# Patient Record
Sex: Male | Born: 1965 | Race: White | Hispanic: No | Marital: Married | Smoking: Former smoker
Health system: Southern US, Community
[De-identification: ages and names within clinical notes are randomized; demographics above are authoritative.]

## PROBLEM LIST (undated history)

## (undated) DIAGNOSIS — R058 Other specified cough: Secondary | ICD-10-CM

## (undated) DIAGNOSIS — I10 Essential (primary) hypertension: Secondary | ICD-10-CM

## (undated) DIAGNOSIS — M779 Enthesopathy, unspecified: Secondary | ICD-10-CM

## (undated) DIAGNOSIS — I219 Acute myocardial infarction, unspecified: Secondary | ICD-10-CM

## (undated) DIAGNOSIS — Z8719 Personal history of other diseases of the digestive system: Secondary | ICD-10-CM

## (undated) DIAGNOSIS — K4090 Unilateral inguinal hernia, without obstruction or gangrene, not specified as recurrent: Secondary | ICD-10-CM

## (undated) DIAGNOSIS — R05 Cough: Secondary | ICD-10-CM

## (undated) DIAGNOSIS — K219 Gastro-esophageal reflux disease without esophagitis: Secondary | ICD-10-CM

## (undated) DIAGNOSIS — E785 Hyperlipidemia, unspecified: Secondary | ICD-10-CM

## (undated) HISTORY — PX: CARDIAC SURGERY: SHX584

## (undated) HISTORY — PX: OTHER SURGICAL HISTORY: SHX169

## (undated) HISTORY — DX: Gastro-esophageal reflux disease without esophagitis: K21.9

## (undated) HISTORY — DX: Essential (primary) hypertension: I10

## (undated) HISTORY — PX: SHOULDER SURGERY: SHX246

## (undated) HISTORY — DX: Hyperlipidemia, unspecified: E78.5

## (undated) HISTORY — PX: HERNIA REPAIR: SHX51

---

## 1988-02-01 HISTORY — PX: OTHER SURGICAL HISTORY: SHX169

## 2003-03-04 HISTORY — PX: ESOPHAGOGASTRODUODENOSCOPY: SHX1529

## 2007-02-01 HISTORY — PX: SHOULDER SURGERY: SHX246

## 2007-11-01 HISTORY — PX: ESOPHAGOGASTRODUODENOSCOPY: SHX1529

## 2009-09-07 ENCOUNTER — Emergency Department (HOSPITAL_COMMUNITY): Admission: EM | Admit: 2009-09-07 | Discharge: 2009-09-07 | Payer: Self-pay | Admitting: Emergency Medicine

## 2010-04-16 LAB — POCT CARDIAC MARKERS
CKMB, poc: <1 ng/mL — ABNORMAL LOW (ref 1.0–8.0)
Myoglobin, poc: 65.5 ng/mL (ref 12–200)
Troponin i, poc: <0.05 ng/mL (ref 0.00–0.09)

## 2010-04-16 LAB — BASIC METABOLIC PANEL
BUN: 8 mg/dL (ref 6–23)
CO2: 26 mEq/L (ref 19–32)
Calcium: 9.9 mg/dL (ref 8.4–10.5)
Chloride: 107 mEq/L (ref 96–112)
GFR calc Af Amer: >60 mL/min (ref >60)
Potassium: 4.6 mEq/L (ref 3.5–5.1)
Sodium: 139 mEq/L (ref 135–145)

## 2010-04-16 LAB — DIFFERENTIAL
Basophils Absolute: 0 10*3/uL (ref 0.0–0.1)
Lymphocytes Relative: 28 % (ref 12–46)
Lymphs Abs: 2.7 10*3/uL (ref 0.7–4.0)
Monocytes Relative: 9 % (ref 3–12)

## 2010-04-16 LAB — CBC
HCT: 41.3 % (ref 39.0–52.0)
MCH: 31.8 pg (ref 26.0–34.0)
MCV: 90.9 fL (ref 78.0–100.0)
RBC: 4.55 MIL/uL (ref 4.22–5.81)

## 2011-07-20 IMAGING — CR DG CHEST 1V PORT
1 series · 1 of 1 positions shown · non-contrast
Comparison: Portable exam 4192 hours without priors for comparison.

CLINICAL DATA: Chest pain, shortness of breath, hypertension

PORTABLE CHEST - 1 VIEW

[view not recorded]
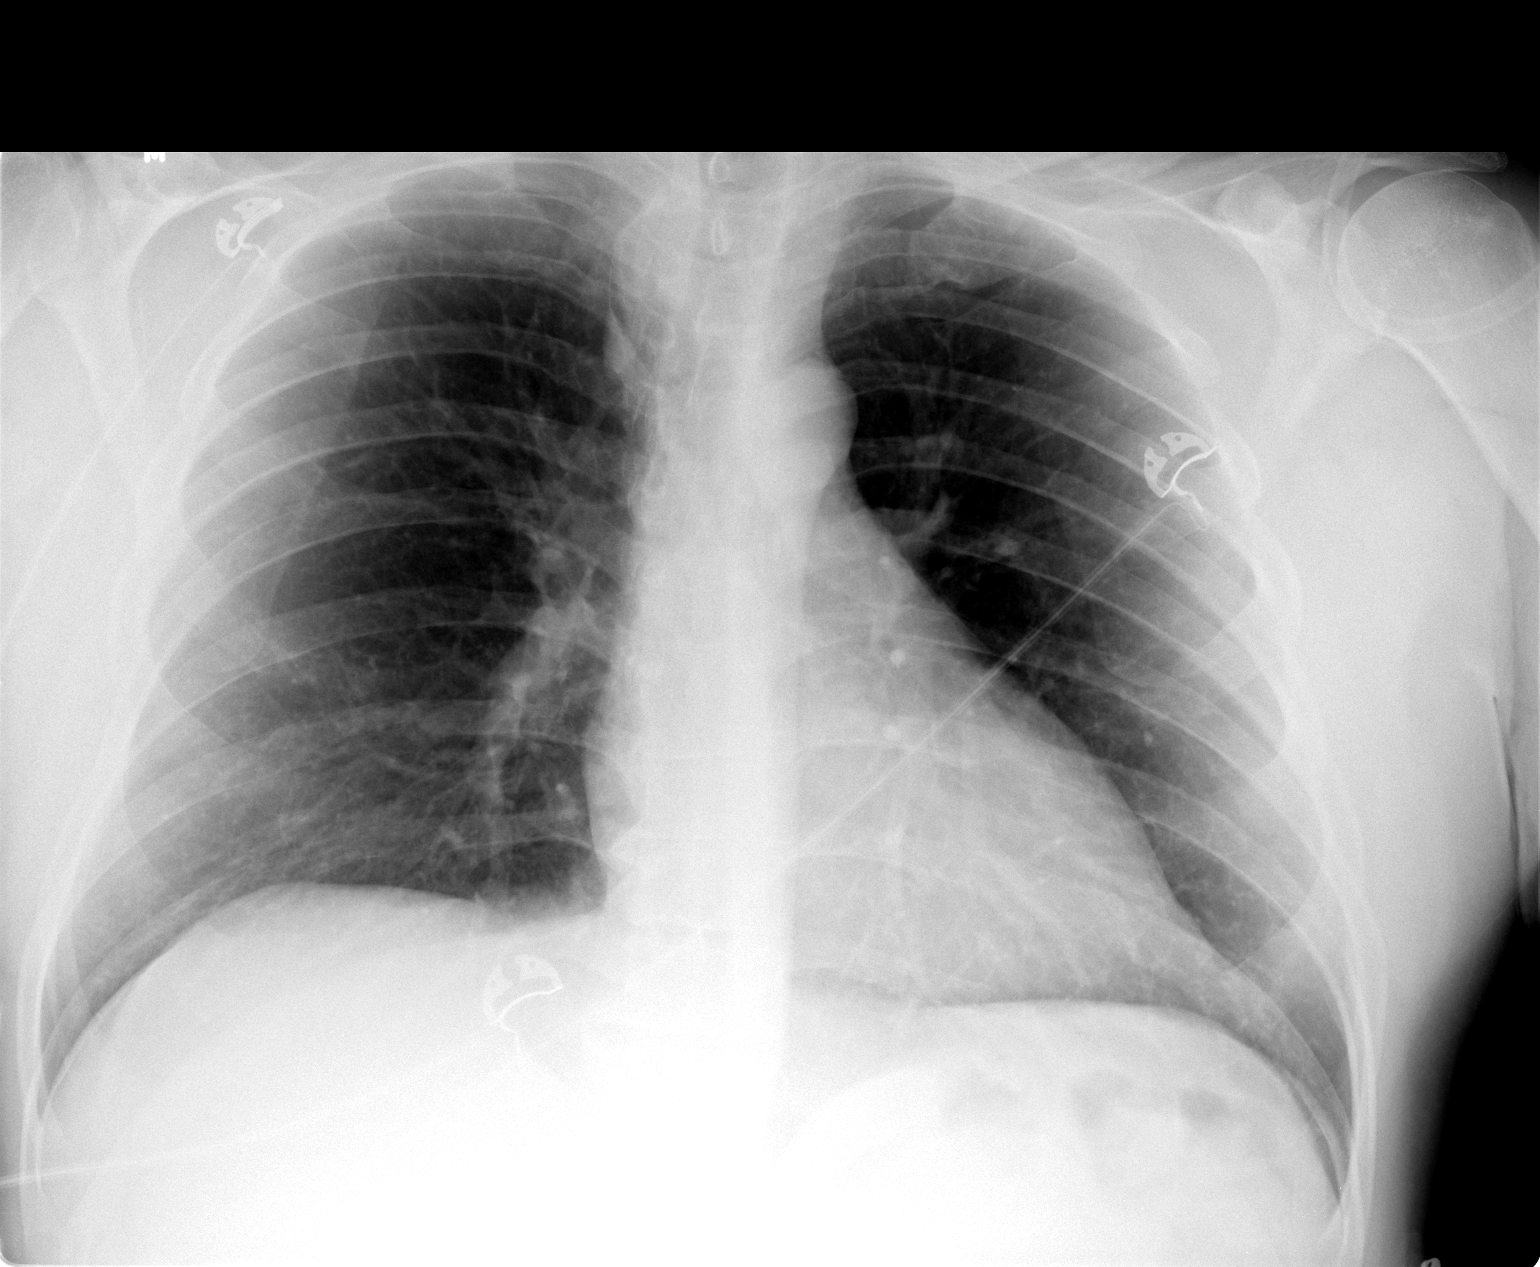

[1 of 1 positions shown; findings below may reference images not displayed]

FINDINGS: Normal heart size, mediastinal contours, and pulmonary vascularity.
Lungs clear.
No pleural effusion or pneumothorax.
No acute bony findings.
IMPRESSION: No acute abnormalities.

## 2013-01-14 ENCOUNTER — Encounter (INDEPENDENT_AMBULATORY_CARE_PROVIDER_SITE_OTHER): Payer: Self-pay | Admitting: Surgery

## 2013-01-28 ENCOUNTER — Encounter (INDEPENDENT_AMBULATORY_CARE_PROVIDER_SITE_OTHER): Payer: Self-pay

## 2013-01-28 ENCOUNTER — Encounter (INDEPENDENT_AMBULATORY_CARE_PROVIDER_SITE_OTHER): Payer: Self-pay | Admitting: Surgery

## 2013-01-28 ENCOUNTER — Ambulatory Visit (INDEPENDENT_AMBULATORY_CARE_PROVIDER_SITE_OTHER): Payer: BC Managed Care – PPO | Admitting: Surgery

## 2013-01-28 VITALS — BP 130/80 | HR 84 | Temp 98.9°F | Resp 14 | Ht 69.0 in | Wt 214.6 lb

## 2013-01-28 DIAGNOSIS — K4090 Unilateral inguinal hernia, without obstruction or gangrene, not specified as recurrent: Secondary | ICD-10-CM
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
## 2013-02-01 ENCOUNTER — Telehealth (INDEPENDENT_AMBULATORY_CARE_PROVIDER_SITE_OTHER): Payer: Self-pay

## 2013-02-01 NOTE — Telephone Encounter (Signed)
Pts wife calling to see if it will be ok for pt to get flu shot prior to surgery. I advised her that we would not restrict him from getting flu shot. She states she will discuss this with her husband to see if he wants to proceed with getting flu shot now.

## 2013-02-05 ENCOUNTER — Encounter (HOSPITAL_COMMUNITY): Payer: Self-pay | Admitting: Pharmacy Technician

## 2013-02-07 NOTE — Patient Instructions (Addendum)
Luis Hardin  02/07/2013                           YOUR PROCEDURE IS SCHEDULED ON:  02/12/13               PLEASE REPORT TO SHORT STAY CENTER AT :  7:30 am               CALL THIS NUMBER IF ANY PROBLEMS THE DAY OF SURGERY :               [phone]                      REMEMBER:   Do not eat food or drink liquids AFTER MIDNIGHT   Take these medicines the morning of surgery with A SIP OF WATER:  OMEORAZOLE   Do not wear jewelry, make-up   Do not wear lotions, powders, or perfumes.   Do not shave legs or underarms 12 hrs. before surgery (men may shave face)  Do not bring valuables to the hospital.  Contacts, dentures or bridgework may not be worn into surgery.  Leave suitcase in the car. After surgery it may be brought to your room.  For patients admitted to the hospital more than one night, checkout time is 11:00                          The day of discharge.   Patients discharged the day of surgery will not be allowed to drive home                             If going home same day of surgery, must have someone stay with you first                           24 hrs at home and arrange for some one to drive you home from hospital.    Special Instructions:   Please read over the following fact sheets that you were given:                 1. [place]                2. DISCONTINUE ASPIRIN AND HERBAL MED 5 DAYS PREOP                                                X_____________________________________________________________________        Failure to follow these instructions may result in cancellation of your surgery

## 2013-02-08 ENCOUNTER — Encounter (HOSPITAL_COMMUNITY)
Admission: RE | Admit: 2013-02-08 | Discharge: 2013-02-08 | Disposition: A | Discharge status: Home / Self Care | Payer: No Typology Code available for payment source | Source: Ambulatory Visit | Attending: Surgery | Admitting: Surgery

## 2013-02-08 ENCOUNTER — Encounter (HOSPITAL_COMMUNITY): Payer: Self-pay

## 2013-02-08 ENCOUNTER — Ambulatory Visit (HOSPITAL_COMMUNITY)
Admission: RE | Admit: 2013-02-08 | Discharge: 2013-02-08 | Disposition: A | Discharge status: Home / Self Care | Payer: No Typology Code available for payment source | Source: Ambulatory Visit | Attending: Surgery | Admitting: Surgery

## 2013-02-08 DIAGNOSIS — K4090 Unilateral inguinal hernia, without obstruction or gangrene, not specified as recurrent: Secondary | ICD-10-CM | POA: Insufficient documentation

## 2013-02-08 DIAGNOSIS — Z01818 Encounter for other preprocedural examination: Secondary | ICD-10-CM | POA: Insufficient documentation

## 2013-02-08 DIAGNOSIS — I10 Essential (primary) hypertension: Secondary | ICD-10-CM | POA: Insufficient documentation

## 2013-02-08 DIAGNOSIS — Z01812 Encounter for preprocedural laboratory examination: Secondary | ICD-10-CM | POA: Insufficient documentation

## 2013-02-08 DIAGNOSIS — E785 Hyperlipidemia, unspecified: Secondary | ICD-10-CM | POA: Insufficient documentation

## 2013-02-08 DIAGNOSIS — Z01810 Encounter for preprocedural cardiovascular examination: Secondary | ICD-10-CM | POA: Insufficient documentation

## 2013-02-08 DIAGNOSIS — R059 Cough, unspecified: Secondary | ICD-10-CM | POA: Insufficient documentation

## 2013-02-08 DIAGNOSIS — R05 Cough: Secondary | ICD-10-CM | POA: Insufficient documentation

## 2013-02-08 HISTORY — DX: Cough: R05

## 2013-02-08 HISTORY — DX: Enthesopathy, unspecified: M77.9

## 2013-02-08 HISTORY — DX: Unilateral inguinal hernia, without obstruction or gangrene, not specified as recurrent: K40.90

## 2013-02-08 HISTORY — DX: Other specified cough: R05.8

## 2013-02-08 HISTORY — DX: Personal history of other diseases of the digestive system: Z87.19

## 2013-02-08 LAB — CBC
HCT: 39.9 % (ref 39.0–52.0)
HEMOGLOBIN: 14.4 g/dL (ref 13.0–17.0)
MCH: 31.2 pg (ref 26.0–34.0)
MCHC: 36.1 g/dL — ABNORMAL HIGH (ref 30.0–36.0)
MCV: 86.4 fL (ref 78.0–100.0)
Platelets: 283 10*3/uL (ref 150–400)
RBC: 4.62 MIL/uL (ref 4.22–5.81)
RDW: 12.5 % (ref 11.5–15.5)
WBC: 7.8 10*3/uL (ref 4.0–10.5)

## 2013-02-08 LAB — BASIC METABOLIC PANEL
BUN: 8 mg/dL (ref 6–23)
CALCIUM: 9.4 mg/dL (ref 8.4–10.5)
CHLORIDE: 101 meq/L (ref 96–112)
CO2: 25 meq/L (ref 19–32)
CREATININE: 0.88 mg/dL (ref 0.50–1.35)
Glucose, Bld: 89 mg/dL (ref 70–99)
POTASSIUM: 4.2 meq/L (ref 3.7–5.3)
Sodium: 139 mEq/L (ref 137–147)
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
## 2013-02-12 ENCOUNTER — Ambulatory Visit (HOSPITAL_COMMUNITY): Payer: No Typology Code available for payment source | Admitting: Certified Registered Nurse Anesthetist

## 2013-02-12 ENCOUNTER — Encounter (HOSPITAL_COMMUNITY)
Admission: RE | Disposition: A | Discharge status: Home / Self Care | Payer: Self-pay | Source: Ambulatory Visit | Attending: Surgery

## 2013-02-12 ENCOUNTER — Ambulatory Visit (HOSPITAL_COMMUNITY)
Admission: RE | Admit: 2013-02-12 | Discharge: 2013-02-12 | Disposition: A | Discharge status: Home / Self Care | Payer: No Typology Code available for payment source | Source: Ambulatory Visit | Attending: Surgery | Admitting: Surgery

## 2013-02-12 ENCOUNTER — Encounter (HOSPITAL_COMMUNITY): Payer: No Typology Code available for payment source | Admitting: Certified Registered Nurse Anesthetist

## 2013-02-12 ENCOUNTER — Encounter (HOSPITAL_COMMUNITY): Payer: Self-pay | Admitting: *Deleted

## 2013-02-12 DIAGNOSIS — K4090 Unilateral inguinal hernia, without obstruction or gangrene, not specified as recurrent: Secondary | ICD-10-CM | POA: Insufficient documentation

## 2013-02-12 DIAGNOSIS — E785 Hyperlipidemia, unspecified: Secondary | ICD-10-CM | POA: Insufficient documentation

## 2013-02-12 DIAGNOSIS — Z79899 Other long term (current) drug therapy: Secondary | ICD-10-CM | POA: Insufficient documentation

## 2013-02-12 DIAGNOSIS — Z87891 Personal history of nicotine dependence: Secondary | ICD-10-CM | POA: Insufficient documentation

## 2013-02-12 DIAGNOSIS — I10 Essential (primary) hypertension: Secondary | ICD-10-CM | POA: Insufficient documentation

## 2013-02-12 DIAGNOSIS — K219 Gastro-esophageal reflux disease without esophagitis: Secondary | ICD-10-CM | POA: Insufficient documentation

## 2013-02-12 DIAGNOSIS — K449 Diaphragmatic hernia without obstruction or gangrene: Secondary | ICD-10-CM | POA: Insufficient documentation

## 2013-02-12 HISTORY — PX: INSERTION OF MESH: SHX5868

## 2013-02-12 HISTORY — PX: INGUINAL HERNIA REPAIR: SHX194

## 2013-02-12 SURGERY — REPAIR, HERNIA, INGUINAL, ADULT
Anesthesia: General | Site: Abdomen | Laterality: Right

## 2013-02-12 MED ORDER — OXYCODONE HCL 5 MG PO TABS
5.0000 mg | ORAL_TABLET | Freq: Once | ORAL | Status: AC | PRN
  Administered 2013-02-12: 5 mg via ORAL
  Filled 2013-02-12: qty 1

## 2013-02-12 MED ORDER — LIDOCAINE HCL (CARDIAC) 20 MG/ML IV SOLN
INTRAVENOUS | Status: AC
  Filled 2013-02-12: qty 5

## 2013-02-12 MED ORDER — MEPERIDINE HCL 50 MG/ML IJ SOLN: 6.2500 mg | INTRAMUSCULAR | Status: DC | PRN

## 2013-02-12 MED ORDER — FENTANYL CITRATE 0.05 MG/ML IJ SOLN
INTRAMUSCULAR | Status: AC
  Filled 2013-02-12: qty 2

## 2013-02-12 MED ORDER — LIDOCAINE HCL (CARDIAC) 20 MG/ML IV SOLN
INTRAVENOUS | Status: DC | PRN
  Administered 2013-02-12: 100 mg via INTRAVENOUS

## 2013-02-12 MED ORDER — HYDROMORPHONE HCL PF 1 MG/ML IJ SOLN
0.2500 mg | INTRAMUSCULAR | Status: DC | PRN
  Administered 2013-02-12 (×2): 0.5 mg via INTRAVENOUS

## 2013-02-12 MED ORDER — ONDANSETRON HCL 4 MG/2ML IJ SOLN
INTRAMUSCULAR | Status: DC | PRN
  Administered 2013-02-12: 4 mg via INTRAVENOUS

## 2013-02-12 MED ORDER — PROMETHAZINE HCL 25 MG/ML IJ SOLN
INTRAMUSCULAR | Status: AC
  Filled 2013-02-12: qty 1

## 2013-02-12 MED ORDER — HYDROMORPHONE HCL PF 1 MG/ML IJ SOLN
INTRAMUSCULAR | Status: AC
  Filled 2013-02-12: qty 1

## 2013-02-12 MED ORDER — BUPIVACAINE HCL (PF) 0.5 % IJ SOLN
INTRAMUSCULAR | Status: AC
  Filled 2013-02-12: qty 30

## 2013-02-12 MED ORDER — 0.9 % SODIUM CHLORIDE (POUR BTL) OPTIME
TOPICAL | Status: DC | PRN
  Administered 2013-02-12: 1000 mL

## 2013-02-12 MED ORDER — FENTANYL CITRATE 0.05 MG/ML IJ SOLN
INTRAMUSCULAR | Status: DC | PRN
  Administered 2013-02-12 (×6): 50 ug via INTRAVENOUS

## 2013-02-12 MED ORDER — KETOROLAC TROMETHAMINE 30 MG/ML IJ SOLN
INTRAMUSCULAR | Status: DC | PRN
  Administered 2013-02-12: 30 mg via INTRAVENOUS

## 2013-02-12 MED ORDER — PROPOFOL 10 MG/ML IV BOLUS
INTRAVENOUS | Status: DC | PRN
  Administered 2013-02-12: 200 mg via INTRAVENOUS
  Administered 2013-02-12: 130 mg via INTRAVENOUS

## 2013-02-12 MED ORDER — MIDAZOLAM HCL 2 MG/2ML IJ SOLN
INTRAMUSCULAR | Status: AC
  Filled 2013-02-12: qty 2

## 2013-02-12 MED ORDER — CEFAZOLIN SODIUM-DEXTROSE 2-3 GM-% IV SOLR
2.0000 g | INTRAVENOUS | Status: AC
  Administered 2013-02-12: 2 g via INTRAVENOUS

## 2013-02-12 MED ORDER — OXYCODONE HCL 5 MG/5ML PO SOLN
5.0000 mg | Freq: Once | ORAL | Status: AC | PRN
  Filled 2013-02-12: qty 5

## 2013-02-12 MED ORDER — PROMETHAZINE HCL 25 MG/ML IJ SOLN
6.2500 mg | INTRAMUSCULAR | Status: DC | PRN
  Administered 2013-02-12: 12.5 mg via INTRAVENOUS

## 2013-02-12 MED ORDER — HYDROCODONE-ACETAMINOPHEN 5-325 MG PO TABS: 1.0000 | ORAL_TABLET | ORAL | Status: DC | PRN

## 2013-02-12 MED ORDER — DEXAMETHASONE SODIUM PHOSPHATE 10 MG/ML IJ SOLN
INTRAMUSCULAR | Status: DC | PRN
  Administered 2013-02-12: 10 mg via INTRAVENOUS

## 2013-02-12 MED ORDER — CEFAZOLIN SODIUM-DEXTROSE 2-3 GM-% IV SOLR
INTRAVENOUS | Status: AC
  Filled 2013-02-12: qty 50

## 2013-02-12 MED ORDER — BUPIVACAINE HCL (PF) 0.5 % IJ SOLN
INTRAMUSCULAR | Status: DC | PRN
  Administered 2013-02-12: 20 mL

## 2013-02-12 MED ORDER — ONDANSETRON HCL 4 MG/2ML IJ SOLN
INTRAMUSCULAR | Status: AC
  Filled 2013-02-12: qty 2

## 2013-02-12 MED ORDER — MIDAZOLAM HCL 5 MG/5ML IJ SOLN
INTRAMUSCULAR | Status: DC | PRN
  Administered 2013-02-12: 2 mg via INTRAVENOUS

## 2013-02-12 MED ORDER — LACTATED RINGERS IV SOLN
INTRAVENOUS | Status: DC
  Administered 2013-02-12: 10:00:00 via INTRAVENOUS
  Administered 2013-02-12: 1000 mL via INTRAVENOUS

## 2013-02-12 MED ORDER — KETOROLAC TROMETHAMINE 30 MG/ML IJ SOLN
INTRAMUSCULAR | Status: AC
  Filled 2013-02-12: qty 1

## 2013-02-12 MED ORDER — PROPOFOL 10 MG/ML IV BOLUS
INTRAVENOUS | Status: AC
  Filled 2013-02-12: qty 20

## 2013-02-12 MED ORDER — FENTANYL CITRATE 0.05 MG/ML IJ SOLN
INTRAMUSCULAR | Status: AC
  Filled 2013-02-12: qty 5

## 2013-02-12 SURGICAL SUPPLY — 36 items
BLADE HEX COATED 2.75 (ELECTRODE) ×8 IMPLANT
BLADE SURG 15 STRL LF DISP TIS (BLADE) ×2 IMPLANT
BLADE SURG 15 STRL SS (BLADE) ×2
CANISTER SUCTION 2500CC (MISCELLANEOUS) ×4 IMPLANT
CHLORAPREP W/TINT 26ML (MISCELLANEOUS) ×4 IMPLANT
CLOSURE WOUND 1/2 X4 (GAUZE/BANDAGES/DRESSINGS) ×1
DECANTER SPIKE VIAL GLASS SM (MISCELLANEOUS) IMPLANT
DRAIN PENROSE 18X1/2 LTX STRL (DRAIN) ×4 IMPLANT
DRAPE LAPAROTOMY TRNSV 102X78 (DRAPE) ×4 IMPLANT
DRSG TEGADERM 4X4.75 (GAUZE/BANDAGES/DRESSINGS) ×4 IMPLANT
ELECT REM PT RETURN 9FT ADLT (ELECTROSURGICAL) ×4
ELECTRODE REM PT RTRN 9FT ADLT (ELECTROSURGICAL) ×2 IMPLANT
GLOVE SURG SIGNA 7.5 PF LTX (GLOVE) ×4 IMPLANT
GOWN STRL REUS W/TWL XL LVL3 (GOWN DISPOSABLE) ×8 IMPLANT
KIT BASIN OR (CUSTOM PROCEDURE TRAY) ×4 IMPLANT
MESH PARIETEX PROGRIP RIGHT (Mesh General) ×4 IMPLANT
NEEDLE HYPO 25X1 1.5 SAFETY (NEEDLE) ×4 IMPLANT
NS IRRIG 1000ML POUR BTL (IV SOLUTION) ×4 IMPLANT
PACK BASIC VI WITH GOWN DISP (CUSTOM PROCEDURE TRAY) ×4 IMPLANT
PENCIL BUTTON HOLSTER BLD 10FT (ELECTRODE) ×4 IMPLANT
SPONGE GAUZE 4X4 12PLY (GAUZE/BANDAGES/DRESSINGS) ×4 IMPLANT
SPONGE LAP 4X18 X RAY DECT (DISPOSABLE) ×8 IMPLANT
STRIP CLOSURE SKIN 1/2X4 (GAUZE/BANDAGES/DRESSINGS) ×3 IMPLANT
SUT MNCRL AB 4-0 PS2 18 (SUTURE) ×4 IMPLANT
SUT SILK 2 0 SH (SUTURE) ×4 IMPLANT
SUT VIC AB 2-0 CT1 27 (SUTURE) ×4
SUT VIC AB 2-0 CT1 TAPERPNT 27 (SUTURE) ×4 IMPLANT
SUT VIC AB 3-0 54XBRD REEL (SUTURE) IMPLANT
SUT VIC AB 3-0 BRD 54 (SUTURE)
SUT VIC AB 3-0 SH 27 (SUTURE) ×2
SUT VIC AB 3-0 SH 27XBRD (SUTURE) ×2 IMPLANT
SYR 20CC LL (SYRINGE) ×4 IMPLANT
SYR BULB IRRIGATION 50ML (SYRINGE) IMPLANT
TOWEL OR 17X26 10 PK STRL BLUE (TOWEL DISPOSABLE) ×4 IMPLANT
TOWEL OR NON WOVEN STRL DISP B (DISPOSABLE) ×4 IMPLANT
YANKAUER SUCT BULB TIP 10FT TU (MISCELLANEOUS) ×4 IMPLANT

## 2013-02-12 NOTE — Transfer of Care (Signed)
Immediate Anesthesia Transfer of Care Note  Patient: Luis Hardin  Procedure(s) Performed: Procedure(s) (LRB): RIGHT INGUINAL HERNIA REPAIR (Right) INSERTION OF MESH (N/A)  Patient Location: PACU  Anesthesia Type: General  Level of Consciousness: sedated, patient cooperative and responds to stimulation  Airway & Oxygen Therapy: Patient Spontanous Breathing and Patient connected to face mask oxgen  Post-op Assessment: Report given to PACU RN and Post -op Vital signs reviewed and stable  Post vital signs: Reviewed and stable  Complications: No apparent anesthesia complications

## 2013-02-12 NOTE — Op Note (Signed)
RIGHT INGUINAL HERNIA REPAIR, INSERTION OF MESH  Procedure Note  Meade Hogeland 02/12/2013   Pre-op Diagnosis: right inguinal hernia     Post-op Diagnosis: same  Procedure(s): RIGHT INGUINAL HERNIA REPAIR INSERTION OF MESH  Surgeon(s): Harl Bowie, MD  Anesthesia: General  Staff:  Circulator: Garret Reddish Dertouzos, RN Scrub Person: Ignacia Palma, RN Circulator Assistant: Morton Amy, RN  Estimated Blood Loss: Minimal                         Tharon Bomar A   Date: 02/12/2013  Time: 10:49 AM

## 2013-02-12 NOTE — Discharge Instructions (Signed)
[facility] _______[facility]  UMBILICAL OR INGUINAL HERNIA REPAIR: POST OP INSTRUCTIONS  Always review your discharge instruction sheet given to you by the facility where your surgery was performed. IF YOU HAVE DISABILITY OR FAMILY LEAVE FORMS, YOU MUST BRING THEM TO THE OFFICE FOR PROCESSING.   DO NOT GIVE THEM TO YOUR DOCTOR.  1. A  prescription for pain medication may be given to you upon discharge.  Take your pain medication as prescribed, if needed.  If narcotic pain medicine is not needed, then you may take acetaminophen (Tylenol) or ibuprofen (Advil) as needed. 2. Take your usually prescribed medications unless otherwise directed. 3. If you need a refill on your pain medication, please contact your pharmacy.  They will contact our office to request authorization. Prescriptions will not be filled after 5 pm or on week-ends. 4. You should follow a light diet the first 24 hours after arrival home, such as soup and crackers, etc.  Be sure to include lots of fluids daily.  Resume your normal diet the day after surgery. 5. Most patients will experience some swelling and bruising around the umbilicus or in the groin and scrotum.  Ice packs and reclining will help.  Swelling and bruising can take several days to resolve.  6. It is common to experience some constipation if taking pain medication after surgery.  Increasing fluid intake and taking a stool softener (such as Colace) will usually help or prevent this problem from occurring.  A mild laxative (Milk of Magnesia or Miralax) should be taken according to package directions if there are no bowel movements after 48 hours. 7. Unless discharge instructions indicate otherwise, you may remove your bandages 24-48 hours after surgery, and you may shower at that time.  You may have steri-strips (small skin tapes) in place directly over the incision.  These strips should be left on the skin for 7-10 days.  If your surgeon used skin glue on the  incision, you may shower in 24 hours.  The glue will flake off over the next 2-3 weeks.  Any sutures or staples will be removed at the office during your follow-up visit. 8. ACTIVITIES:  You may resume regular (light) daily activities beginning the next day--such as daily self-care, walking, climbing stairs--gradually increasing activities as tolerated.  You may have sexual intercourse when it is comfortable.  Refrain from any heavy lifting or straining until approved by your doctor. a. You may drive when you are no longer taking prescription pain medication, you can comfortably wear a seatbelt, and you can safely maneuver your car and apply brakes. b. RETURN TO WORK:  __________________________________________________________ 9. You should see your doctor in the office for a follow-up appointment approximately 2-3 weeks after your surgery.  Make sure that you call for this appointment within a day or two after you arrive home to insure a convenient appointment time. 10. OTHER INSTRUCTIONS: NO LIFTING MORE THAN 15 POUNDS FOR 4 WEEKS 11. ICE PACK ALSO FOR PAIN AS WELL AS IBUPROFEN 12. MAY REMOVE BANDAGE TOMORROW AND SHOWER 13. STOOL SOFTENER FOR CONSTIPATION __________________________________________________________________________________________________________________________________________________________________________________________  WHEN TO CALL YOUR DOCTOR: 1. Fever over 101.0 2. Inability to urinate 3. Nausea and/or vomiting 4. Extreme swelling or bruising 5. Continued bleeding from incision. 6. Increased pain, redness, or drainage from the incision  The clinic staff is available to answer your questions during regular business hours.  Please dont hesitate to call and ask to speak to one of the nurses for clinical concerns.  If  you have a medical emergency, go to the nearest emergency room or call 911.  A surgeon from [facility] is always on call at the hospital   [address], [place], [place], [place]  [address] ?  [address], [place], [place]   [address] [phone] ? [phone] ? FAX [phone] Web site: [email or web address]

## 2013-02-12 NOTE — Anesthesia Preprocedure Evaluation (Addendum)
Anesthesia Evaluation  Patient identified by MRN, date of birth, ID band Patient awake    Reviewed: Allergy & Precautions, H&P , NPO status , Patient's Chart, lab work & pertinent test results  Airway Mallampati: II TM Distance: >3 FB     Dental  (+) Dental Advisory Given   Pulmonary neg pulmonary ROS, former smoker,  breath sounds clear to auscultation        Cardiovascular hypertension, Pt. on medications Rhythm:Regular Rate:Normal     Neuro/Psych negative neurological ROS  negative psych ROS   GI/Hepatic Neg liver ROS, hiatal hernia, GERD-  Medicated,  Endo/Other  negative endocrine ROS  Renal/GU negative Renal ROS     Musculoskeletal negative musculoskeletal ROS (+)   Abdominal (+) + obese,   Peds  Hematology negative hematology ROS (+)   Anesthesia Other Findings   Reproductive/Obstetrics negative OB ROS                          Anesthesia Physical Anesthesia Plan  ASA: II  Anesthesia Plan: General   Post-op Pain Management:    Induction: Intravenous  Airway Management Planned: Oral ETT  Additional Equipment:   Intra-op Plan:   Post-operative Plan: Extubation in OR  Informed Consent: I have reviewed the patients History and Physical, chart, labs and discussed the procedure including the risks, benefits and alternatives for the proposed anesthesia with the patient or authorized representative who has indicated his/her understanding and acceptance.   Dental advisory given  Plan Discussed with: CRNA  Anesthesia Plan Comments:         Anesthesia Quick Evaluation

## 2013-02-12 NOTE — Anesthesia Postprocedure Evaluation (Signed)
Anesthesia Post Note  Patient: Luis Hardin  Procedure(s) Performed: Procedure(s) (LRB): RIGHT INGUINAL HERNIA REPAIR (Right) INSERTION OF MESH (N/A)  Anesthesia type: General  Patient location: PACU  Post pain: Pain level controlled  Post assessment: Post-op Vital signs reviewed  Last Vitals: BP 144/92  Pulse 88  Temp(Src) 36.2 C (Oral)  Resp 16  SpO2 97%  Post vital signs: Reviewed  Level of consciousness: sedated  Complications: No apparent anesthesia complications

## 2013-02-12 NOTE — Interval H&P Note (Signed)
History and Physical Interval Note: no change in H and P  02/12/2013 9:31 AM  Luis Hardin  has presented today for surgery, with the diagnosis of right inguinal hernia  The various methods of treatment have been discussed with the patient and family. After consideration of risks, benefits and other options for treatment, the patient has consented to  Procedure(s): RIGHT INGUINAL HERNIA REPAIR (Right) INSERTION OF MESH (N/A) as a surgical intervention .  The patient's history has been reviewed, patient examined, no change in status, stable for surgery.  I have reviewed the patient's chart and labs.  Questions were answered to the patient's satisfaction.     [name] A

## 2013-02-12 NOTE — Preoperative (Signed)
Beta Blockers   Reason not to administer Beta Blockers:Not Applicable 

## 2013-02-13 ENCOUNTER — Encounter (HOSPITAL_COMMUNITY): Payer: Self-pay | Admitting: Surgery

## 2013-02-13 NOTE — Op Note (Signed)
NAMEGORDAN, GRELL NO.:  [id number]  MEDICAL RECORD NO.:  [id number]  LOCATION:  XRAY                         FACILITY:  [facility]  PHYSICIAN:  Coralie Keens, M.D. DATE OF BIRTH:  November 22, 1965  DATE OF PROCEDURE:  02/12/2013 DATE OF DISCHARGE:  02/08/2013                              OPERATIVE REPORT   PREOPERATIVE DIAGNOSIS:  Right inguinal hernia.  POSTOPERATIVE DIAGNOSIS:  Right inguinal hernia.  PROCEDURE:  Right inguinal hernia repair with mesh.  SURGEON:  Coralie Keens, M.D.  ANESTHESIA:  General endotracheal anesthesia and 0.5% Marcaine.  ESTIMATED BLOOD LOSS:  Minimal.  FINDINGS:  The patient was found to have a large indirect hernia sac which was repaired with a piece of Parietex, Prolene, ProGrip mesh.  PROCEDURE IN DETAIL:  The patient was brought to the operating room and identified as Luis Hardin.  He was placed supine on the operating table and general anesthesia was induced.  His right upper quadrant, abdomen were prepped and draped in usual sterile fashion.  I then used 0.5% Marcaine and performed an ilioinguinal nerve block, as well as anesthetize the skin.  I then made a longitudinal incision with a scalpel.  I took this down to the Scarpa's fascia with electrocautery. The external oblique fascia was then identified and opened through the internal and external rings.  The patient's testicular cord structures were controlled with Penrose drain.  The patient had a very large indirect hernia sac which I was able to reduce from the cord structures. I then tied off  one small bleeding vein with a silk suture.  I then tied off the base of the sac after reducing contents back to the abdominal cavity with a silk suture.  I then excised  the redundant sac with a electrocautery.  Next a piece of Prolene, Parietex, ProGrip mesh was brought to the field.  I placed it as an onlay on the inguinal floor.  I then brought around the cord structures.   I then sewed it to the pubic tubercle, as well as the shelving edge of the inguinal ligament with interrupted 2-0 Vicryl sutures.  I removed the Penrose drain.  Good coverage of the inguinal floor appeared to be achieved.  I then closed the external oblique fascia over the top of this with running 2-0 Vicryl suture.  The fascia was anesthetized further with Marcaine.  I then closed the subcutaneous tissue with interrupted 3-0 Vicryl sutures and closed the skin with a running 4-0 Monocryl.  Steri-Strips,  gauze, and Tegaderm were then applied.  The patient tolerated the procedure well.  All the counts were correct at the end of procedure.  The patient was then extubated in the operating room and taken in stable condition to recovery room.     Coralie Keens, M.D.     DB/MEDQ  D:  02/12/2013  T:  02/12/2013  Job:  [id number]

## 2013-02-15 ENCOUNTER — Telehealth (INDEPENDENT_AMBULATORY_CARE_PROVIDER_SITE_OTHER): Payer: Self-pay | Admitting: General Surgery

## 2013-02-15 NOTE — Telephone Encounter (Signed)
Pt's wife called to discuss problem with constipation post operatively.  He is walking some now and has begun to pass gas.  Recommended he take MOM per the label instructions, continue taking stool softeners (up to 3 a day, as he is now doing,) drink as much fluid as possible and begin to scale back on narcotics.  OK to take ibuprofen or Aleve during the day or between doses of narcotics.  She understands and will relate same to pt.

## 2013-02-25 ENCOUNTER — Ambulatory Visit (INDEPENDENT_AMBULATORY_CARE_PROVIDER_SITE_OTHER): Payer: Medicare Other | Admitting: Surgery

## 2013-02-25 ENCOUNTER — Encounter (INDEPENDENT_AMBULATORY_CARE_PROVIDER_SITE_OTHER): Payer: Self-pay | Admitting: Surgery

## 2013-02-25 VITALS — BP 128/88 | HR 77 | Temp 97.4°F | Resp 16 | Ht 69.0 in | Wt 214.2 lb

## 2013-02-25 DIAGNOSIS — Z09 Encounter for follow-up examination after completed treatment for conditions other than malignant neoplasm: Secondary | ICD-10-CM

## 2013-02-25 NOTE — Progress Notes (Signed)
Subjective:     Patient ID: Luis Hardin, male   DOB: 27-Oct-1965, 48 y.o.   MRN: [id number]  HPI He is here for his first postop visit status post right inguinal hernia repair with mesh. He is doing well and has no complaints.  Review of Systems     Objective:   Physical Exam On exam, his incision is well-healed. There is mild postoperative swelling. There is no evidence of recurrence.    Assessment:     Patient stable postop     Plan:     He is recovering well. He may return to work on 03/18/2013 to full duty without restrictions. He may do no lifting until then. I will see him back as needed

## 2013-08-07 ENCOUNTER — Telehealth (INDEPENDENT_AMBULATORY_CARE_PROVIDER_SITE_OTHER): Payer: Self-pay | Admitting: General Surgery

## 2013-08-07 NOTE — Telephone Encounter (Signed)
Message copied by Maryclare Bean on Wed Aug 07, 2013 10:01 AM ------      Message from: [place], [place]: Wed Aug 07, 2013  9:32 AM      Contact: [phone]       Please call him he had surgery in jan and he is still having pain and has some questions please call ------

## 2013-08-07 NOTE — Telephone Encounter (Signed)
Patient has a block phone

## 2014-12-21 IMAGING — CR DG CHEST 2V
2 series · 2 of 2 positions shown · non-contrast
Comparison: 09/07/2009

CLINICAL DATA: Preoperative evaluation for hernia repair, cough for
few days, history hypertension, hyperlipidemia

EXAM:
CHEST  2 VIEW

[w chest pa]
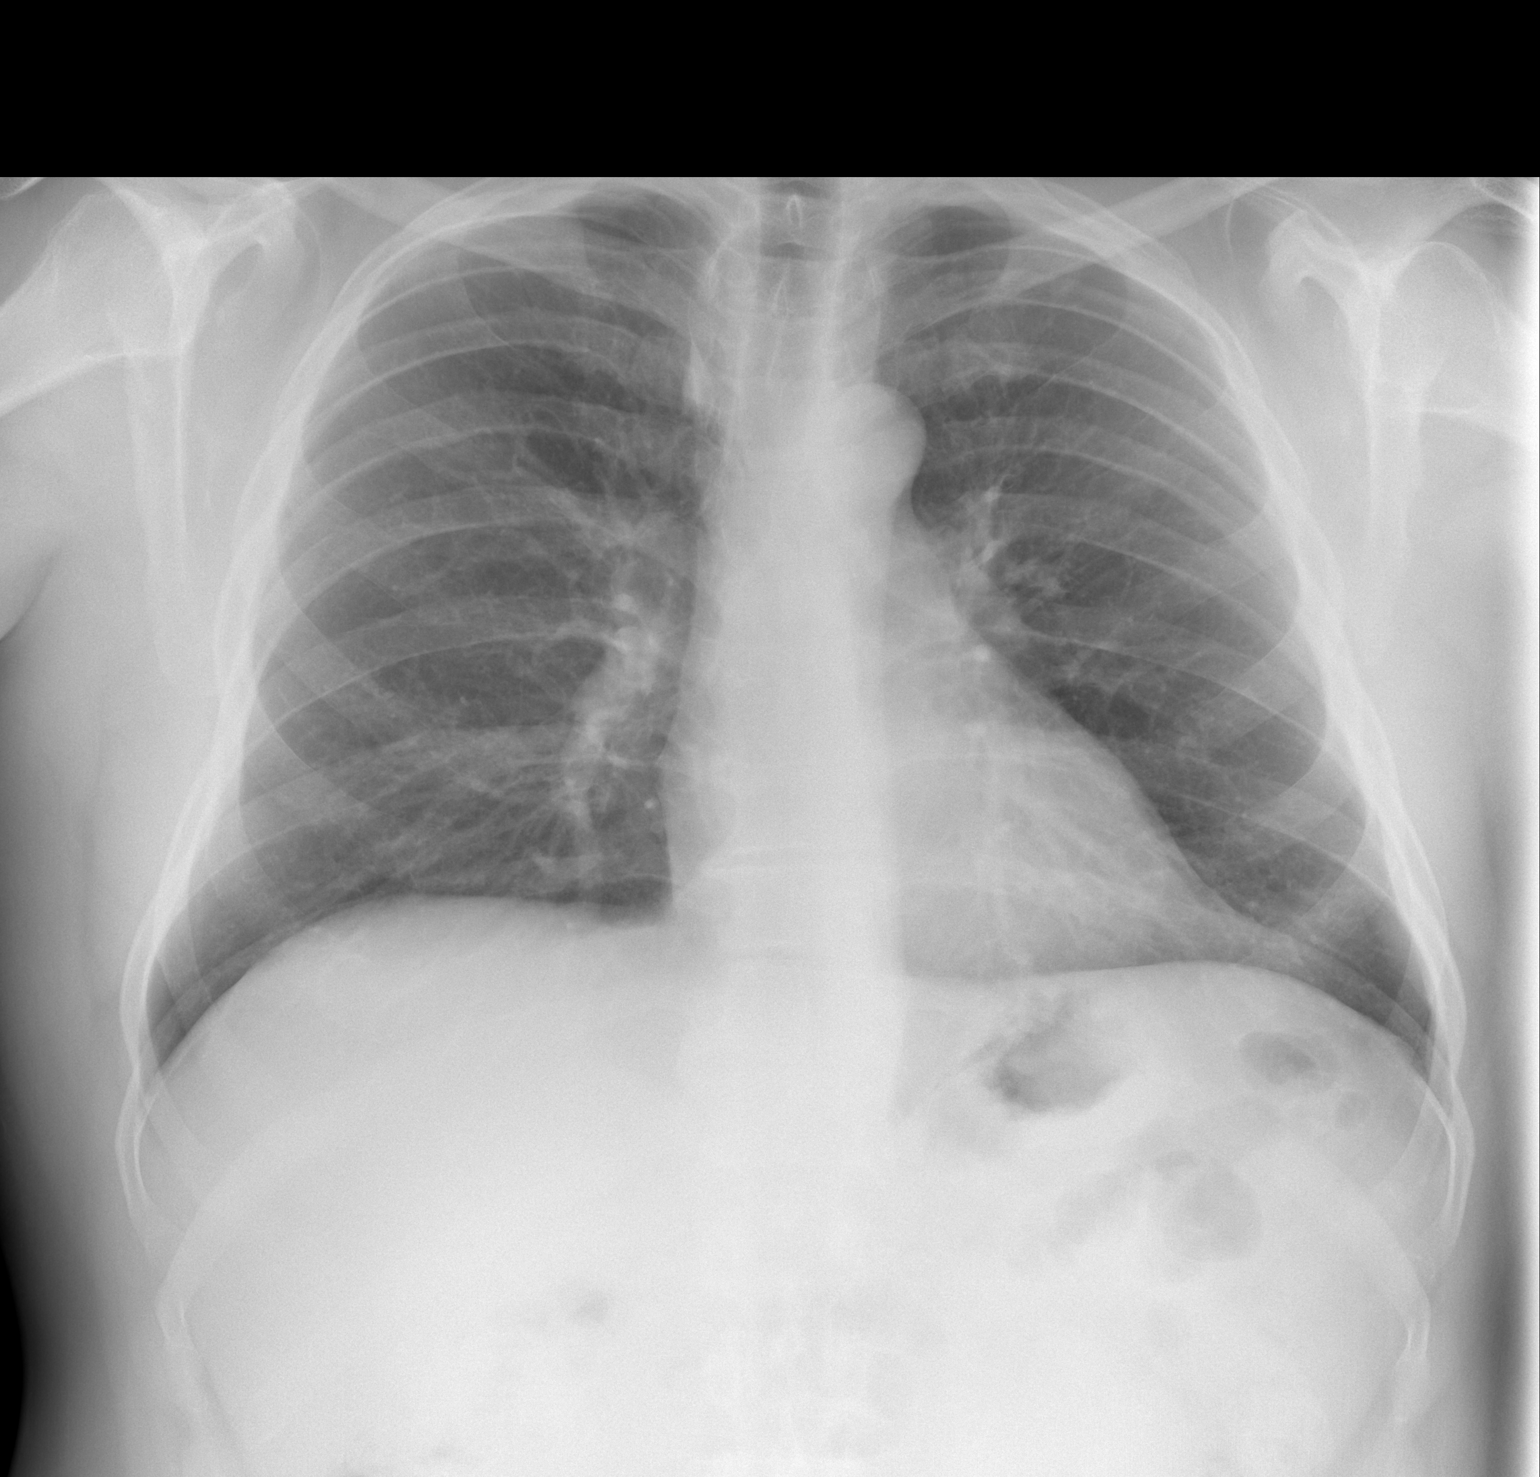

[w chest lat]
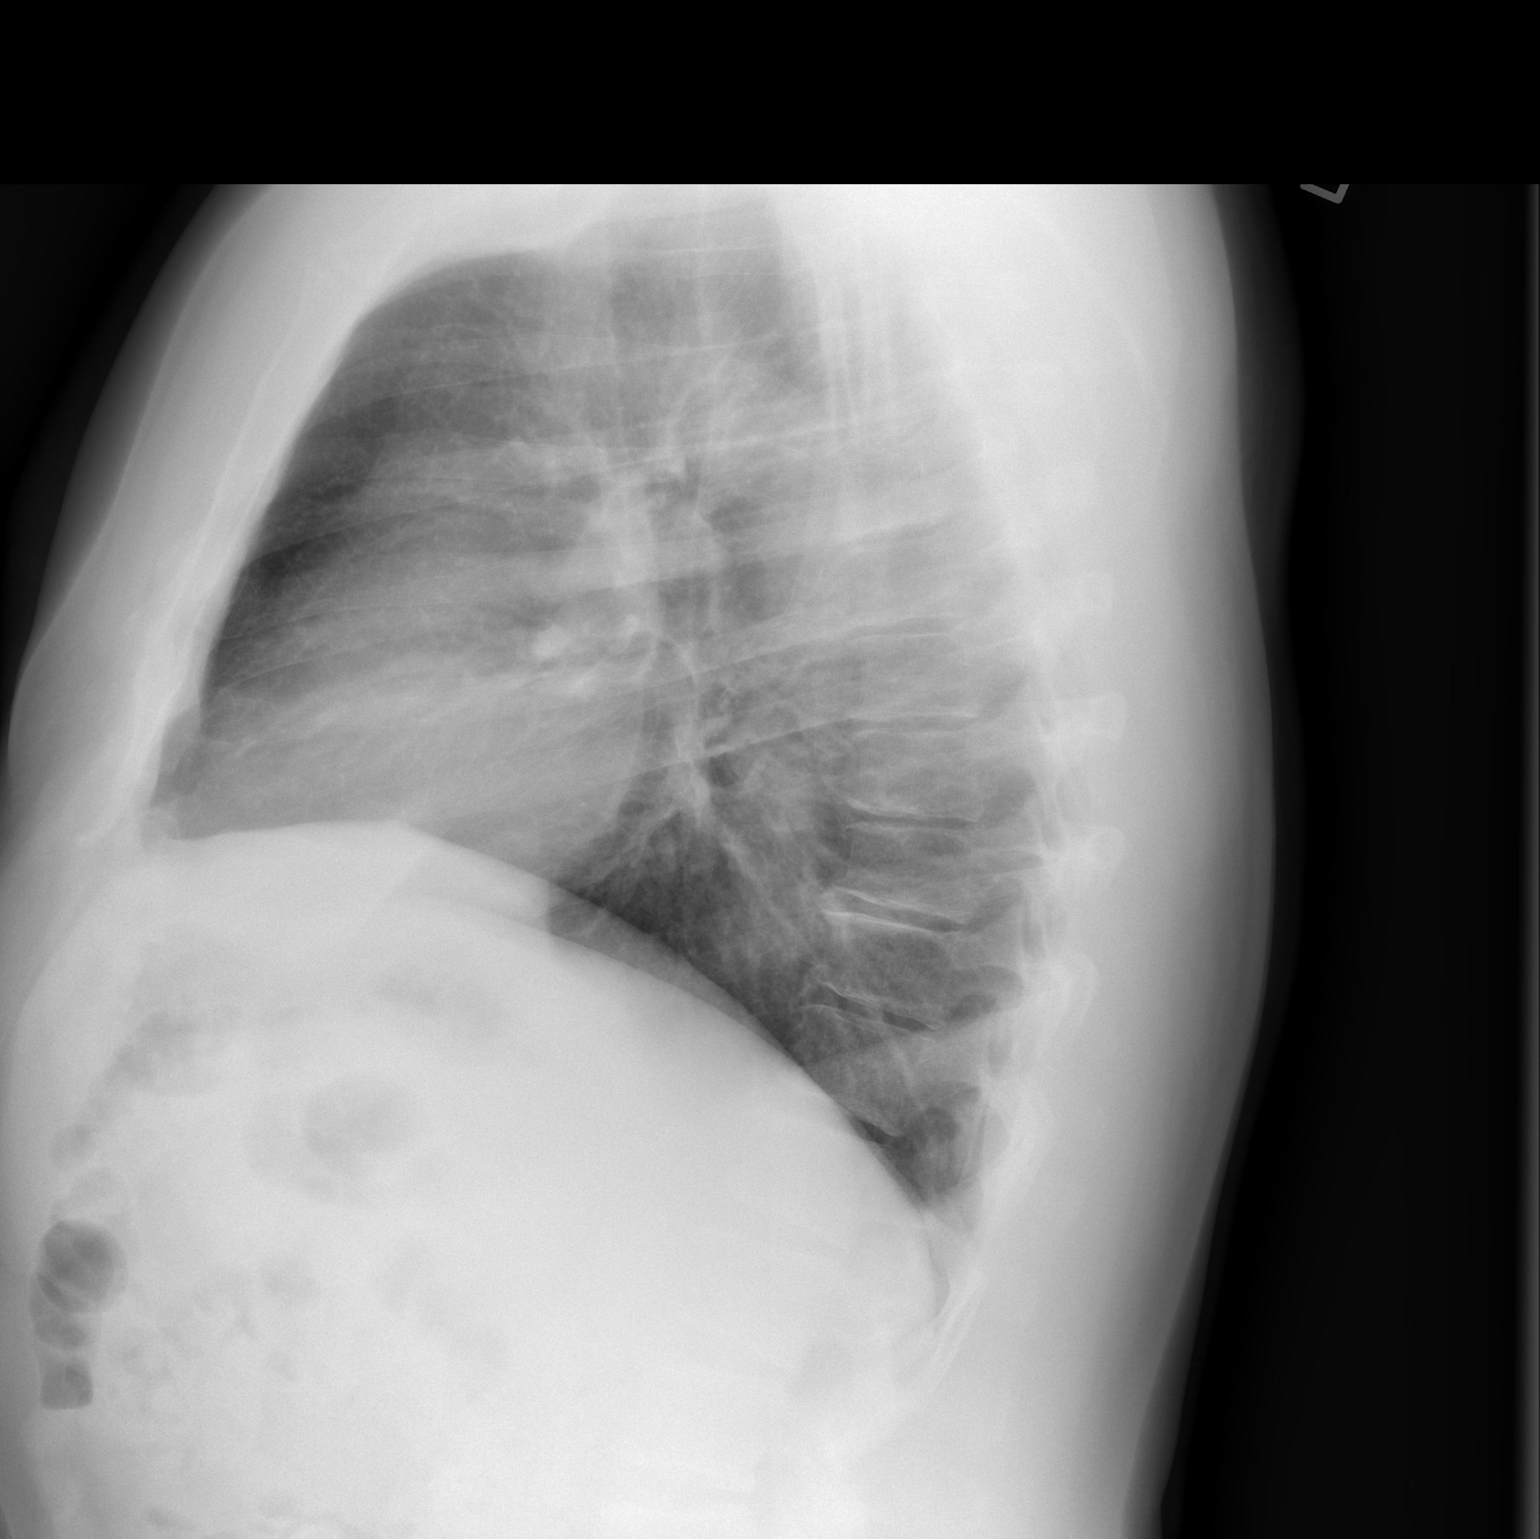

[2 of 2 positions shown; findings below may reference images not displayed]

FINDINGS: Normal heart size, mediastinal contours, and pulmonary vascularity.

Lungs clear.

Bones unremarkable.

No pneumothorax.

Azygos fissure incidentally noted, normal variant.
IMPRESSION: Normal exam.

## 2015-11-25 ENCOUNTER — Telehealth: Payer: Self-pay

## 2015-11-25 NOTE — Telephone Encounter (Signed)
PLEASE CALL PATIENT TO TRIAGE  [phone]

## 2015-12-01 NOTE — Telephone Encounter (Signed)
I called and spoke to pt's wife. She said that he has hemorrhoids and having some rectal bleeding. OV with Neil Crouch, PA on 12/17/2015 at 2:30 pm.

## 2015-12-17 ENCOUNTER — Encounter: Payer: Self-pay | Admitting: Gastroenterology

## 2015-12-17 ENCOUNTER — Ambulatory Visit (INDEPENDENT_AMBULATORY_CARE_PROVIDER_SITE_OTHER): Payer: 59 | Admitting: Gastroenterology

## 2015-12-17 ENCOUNTER — Other Ambulatory Visit: Payer: Self-pay

## 2015-12-17 DIAGNOSIS — K649 Unspecified hemorrhoids: Secondary | ICD-10-CM

## 2015-12-17 DIAGNOSIS — K625 Hemorrhage of anus and rectum: Secondary | ICD-10-CM | POA: Diagnosis not present

## 2015-12-17 DIAGNOSIS — K219 Gastro-esophageal reflux disease without esophagitis: Secondary | ICD-10-CM

## 2015-12-17 MED ORDER — PEG 3350-KCL-NA BICARB-NACL 420 G PO SOLR: 4000.0000 mL | ORAL | 0 refills | Status: DC

## 2015-12-17 NOTE — Patient Instructions (Signed)
1. Colonoscopy with possible hemorrhoid banding with Dr. Oneida Alar. See separate instructions.

## 2015-12-17 NOTE — Assessment & Plan Note (Signed)
50 year old gentleman with history of intermittent rectal bleeding likely due to hemorrhoids. No prior colonoscopy. Recommend colonoscopy in the near future with possible hemorrhoid banding. Patient has failed topical regimens he continues to have itching and fecal soilage which is bothersome on a regular basis. Patient like to have hemorrhoid banding if deemed a candidate at time of his colonoscopy.  I have discussed the risks, alternatives, benefits with regards to but not limited to the risk of reaction to medication, bleeding, infection, perforation and the patient is agreeable to proceed. Written consent to be obtained.

## 2015-12-17 NOTE — Assessment & Plan Note (Signed)
Patient gives history of chronic GERD, ulcerative esophagitis in the remote past. Questionable Barrett's but states subsequent endoscopy was negative for this. Symptoms well controlled on PPI therapy. When he stopped medication recently had significant heartburn recurrence. Continue PPI therapy. We will request a copy of endoscopy records for further review.

## 2015-12-17 NOTE — Patient Instructions (Signed)
PA# for TCS: [id number]

## 2015-12-17 NOTE — Progress Notes (Signed)
EGD 11/06/2007 by Dr. Bernarda Caffey. Several tongues of columnar epithelium coming up from Z line suspicious for short segment Barrett's, biopsies not consistent with Barrett's. Biopsy showed eosinophilic esophagitis, acute and chronic inflammation of distal esophagus and gastric cardia.  EGD 03/20/2003 by Dr. Marden Noble Shiflet question short segment Barrett's, biopsy with chronic carditis. According to that operative note, patient had a history of erosive esophagitis in the past.

## 2015-12-17 NOTE — Progress Notes (Addendum)
REVIEWED-NO ADDITIONAL RECOMMENDATIONS.  Primary Care Physician:  Shade Flood, MD  Primary Gastroenterologist:  Barney Drain, MD   Chief Complaint  Patient presents with  . Hemorrhoids    HPI:  Luis Hardin is a 50 y.o. male here At the request of his PCP for colonoscopy. He's never had a colonoscopy. He does have issues with hemorrhoids and intermittent rectal bleeding. His wife is a patient of Dr. Oneida Alar. Patient reports regular bowel movements. Anal itching is a problem lately. Years ago, used Prep-H which would help. Lately almost constantly having fecal soilage and anal itching without relief with the use of hydrocortisone creams. Small volume brbpr if wiping a lot. Denies abdominal pain. Heartburn is well controlled as long as he stays on this medication. No dysphagia. On omeprazole 20 years. Dr. San Jetty did EGD, ulcerative esophagitis. Tested for Barrett's. Patient reports it turned out negative. Has had 2 endoscopies with the last one being clean. PCP recently encouraged him to come off of PPI therapy, after missing one dose he had recurrent heartburn.  Current Outpatient Prescriptions  Medication Sig Dispense Refill  . beta carotene w/minerals (OCUVITE) tablet Take 1 tablet by mouth daily.    . diclofenac (VOLTAREN) 75 MG EC tablet     . lisinopril (PRINIVIL,ZESTRIL) 40 MG tablet Take 40 mg by mouth daily with breakfast.     . omega-3 acid ethyl esters (LOVAZA) 1 G capsule Take by mouth 2 (two) times daily.    Marland Kitchen omeprazole (PRILOSEC) 20 MG capsule Take 20 mg by mouth daily.     No current facility-administered medications for this visit.     Allergies as of 12/17/2015  . (No Known Allergies)    Past Medical History:  Diagnosis Date  . Dry cough   . GERD (gastroesophageal reflux disease)   . H/O hiatal hernia   . Hyperlipidemia   . Hypertension   . Inguinal hernia    RIGHT  . Tendonitis    KNEES    Past Surgical History:  Procedure Laterality Date  . HERNIA  REPAIR    . INGUINAL HERNIA REPAIR Right 02/12/2013   Procedure: RIGHT INGUINAL HERNIA REPAIR;  Surgeon: Harl Bowie, MD;  Location: [facility];  Service: General;  Laterality: Right;  . INSERTION OF MESH N/A 02/12/2013   Procedure: INSERTION OF MESH;  Surgeon: Harl Bowie, MD;  Location: [facility];  Service: General;  Laterality: N/A;  . SHOULDER SURGERY     L SHOULDER  . TUMOR REMOVED     LEFT SIDE OF NECK (BENIGN)    Family History  Problem Relation Age of Onset  . Cancer Father     lung  . Cancer Brother     skin  . Cancer Paternal Grandfather     mouth  . Colon cancer Neg Hx     Social History   Social History  . Marital status: Married    Spouse name: N/A  . Number of children: N/A  . Years of education: N/A   Occupational History  . textiles    Social History Main Topics  . Smoking status: Former Smoker    Quit date: 02/01/1988  . Smokeless tobacco: Never Used  . Alcohol use No  . Drug use: No  . Sexual activity: Not on file   Other Topics Concern  . Not on file   Social History Narrative  . No narrative on file      ROS:  General: Negative for anorexia, weight loss, fever,  chills, fatigue, weakness. Eyes: Negative for vision changes.  ENT: Negative for hoarseness, difficulty swallowing , nasal congestion. CV: Negative for chest pain, angina, palpitations, dyspnea on exertion, peripheral edema.  Respiratory: Negative for dyspnea at rest, dyspnea on exertion, cough, sputum, wheezing.  GI: See history of present illness. GU:  Negative for dysuria, hematuria, urinary incontinence, urinary frequency, nocturnal urination.  MS: Negative for joint pain, low back pain.  Derm: Negative for rash or itching.  Neuro: Negative for weakness, abnormal sensation, seizure, frequent headaches, memory loss, confusion.  Psych: Negative for anxiety, depression, suicidal ideation, hallucinations.  Endo: Negative for unusual weight change.  Heme: Negative for  bruising or bleeding. Allergy: Negative for rash or hives.    Physical Examination:  BP 132/84   Pulse 84   Temp 98.3 F (36.8 C) (Oral)   Ht 5\' 9"  (1.753 m)   Wt 213 lb 12.8 oz (97 kg)   BMI 31.57 kg/m    General: Well-nourished, well-developed in no acute distress.  Head: Normocephalic, atraumatic.   Eyes: Conjunctiva pink, no icterus. Mouth: Oropharyngeal mucosa moist and pink , no lesions erythema or exudate. Neck: Supple without thyromegaly, masses, or lymphadenopathy.  Lungs: Clear to auscultation bilaterally.  Heart: Regular rate and rhythm, no murmurs rubs or gallops.  Abdomen: Bowel sounds are normal, nontender, nondistended, no hepatosplenomegaly or masses, no abdominal bruits or    hernia , no rebound or guarding.   Rectal: deferred. Extremities: No lower extremity edema. No clubbing or deformities.  Neuro: Alert and oriented x 4 , grossly normal neurologically.  Skin: Warm and dry, no rash or jaundice.   Psych: Alert and cooperative, normal mood and affect.

## 2015-12-18 NOTE — Progress Notes (Signed)
cc'ed to pcp °

## 2016-01-04 ENCOUNTER — Ambulatory Visit (HOSPITAL_COMMUNITY)
Admission: RE | Admit: 2016-01-04 | Discharge: 2016-01-04 | Disposition: A | Discharge status: Home / Self Care | Payer: 59 | Source: Ambulatory Visit | Attending: Gastroenterology | Admitting: Gastroenterology

## 2016-01-04 ENCOUNTER — Encounter (HOSPITAL_COMMUNITY)
Admission: RE | Disposition: A | Discharge status: Home / Self Care | Payer: Self-pay | Source: Ambulatory Visit | Attending: Gastroenterology

## 2016-01-04 ENCOUNTER — Encounter (HOSPITAL_COMMUNITY): Payer: Self-pay | Admitting: *Deleted

## 2016-01-04 DIAGNOSIS — Z1211 Encounter for screening for malignant neoplasm of colon: Secondary | ICD-10-CM

## 2016-01-04 DIAGNOSIS — Q438 Other specified congenital malformations of intestine: Secondary | ICD-10-CM | POA: Diagnosis not present

## 2016-01-04 DIAGNOSIS — Z801 Family history of malignant neoplasm of trachea, bronchus and lung: Secondary | ICD-10-CM | POA: Diagnosis not present

## 2016-01-04 DIAGNOSIS — K5730 Diverticulosis of large intestine without perforation or abscess without bleeding: Secondary | ICD-10-CM | POA: Diagnosis not present

## 2016-01-04 DIAGNOSIS — K219 Gastro-esophageal reflux disease without esophagitis: Secondary | ICD-10-CM | POA: Insufficient documentation

## 2016-01-04 DIAGNOSIS — Z800 Family history of malignant neoplasm of digestive organs: Secondary | ICD-10-CM | POA: Diagnosis not present

## 2016-01-04 DIAGNOSIS — I10 Essential (primary) hypertension: Secondary | ICD-10-CM | POA: Insufficient documentation

## 2016-01-04 DIAGNOSIS — E785 Hyperlipidemia, unspecified: Secondary | ICD-10-CM | POA: Insufficient documentation

## 2016-01-04 DIAGNOSIS — Z79899 Other long term (current) drug therapy: Secondary | ICD-10-CM | POA: Insufficient documentation

## 2016-01-04 DIAGNOSIS — K625 Hemorrhage of anus and rectum: Secondary | ICD-10-CM

## 2016-01-04 DIAGNOSIS — K648 Other hemorrhoids: Secondary | ICD-10-CM | POA: Diagnosis not present

## 2016-01-04 DIAGNOSIS — Z808 Family history of malignant neoplasm of other organs or systems: Secondary | ICD-10-CM | POA: Insufficient documentation

## 2016-01-04 DIAGNOSIS — Z87891 Personal history of nicotine dependence: Secondary | ICD-10-CM | POA: Diagnosis not present

## 2016-01-04 DIAGNOSIS — Z1212 Encounter for screening for malignant neoplasm of rectum: Secondary | ICD-10-CM | POA: Diagnosis not present

## 2016-01-04 DIAGNOSIS — R05 Cough: Secondary | ICD-10-CM | POA: Diagnosis not present

## 2016-01-04 DIAGNOSIS — K449 Diaphragmatic hernia without obstruction or gangrene: Secondary | ICD-10-CM | POA: Insufficient documentation

## 2016-01-04 DIAGNOSIS — K644 Residual hemorrhoidal skin tags: Secondary | ICD-10-CM | POA: Diagnosis not present

## 2016-01-04 DIAGNOSIS — K649 Unspecified hemorrhoids: Secondary | ICD-10-CM

## 2016-01-04 HISTORY — PX: COLONOSCOPY: SHX5424

## 2016-01-04 SURGERY — COLONOSCOPY
Anesthesia: Moderate Sedation

## 2016-01-04 MED ORDER — MEPERIDINE HCL 100 MG/ML IJ SOLN
INTRAMUSCULAR | Status: DC | PRN
  Administered 2016-01-04 (×4): 25 mg via INTRAVENOUS

## 2016-01-04 MED ORDER — MIDAZOLAM HCL 5 MG/5ML IJ SOLN
INTRAMUSCULAR | Status: AC
  Filled 2016-01-04: qty 10

## 2016-01-04 MED ORDER — SODIUM CHLORIDE 0.9 % IV SOLN
INTRAVENOUS | Status: DC
  Administered 2016-01-04: 1000 mL via INTRAVENOUS

## 2016-01-04 MED ORDER — MIDAZOLAM HCL 5 MG/5ML IJ SOLN
INTRAMUSCULAR | Status: DC | PRN
  Administered 2016-01-04: 1 mg via INTRAVENOUS
  Administered 2016-01-04: 2 mg via INTRAVENOUS
  Administered 2016-01-04: 1 mg via INTRAVENOUS
  Administered 2016-01-04 (×2): 2 mg via INTRAVENOUS

## 2016-01-04 MED ORDER — MEPERIDINE HCL 100 MG/ML IJ SOLN
INTRAMUSCULAR | Status: AC
  Filled 2016-01-04: qty 2

## 2016-01-04 NOTE — Discharge Instructions (Signed)
YOU DID NOT HAVE ANY POLYPS. You have internal hemorrhoids, which MAY CAUSE FOR YOUR RECTAL BLEEDING.  YOU HAVE diverticulosis IN YOUR RIGHT COLON.   CONTINUE YOUR WEIGHT LOSS EFFORTS. Lose 20 lbs.  WHILE I DO NOT WANT TO ALARM YOU, YOUR BODY MASS INDEX IS OVER 30 WHICH MEANS YOU ARE OBESE. OBESITY DRIVES CANCER GENES AND IS ASSOCIATED WITH AN INCREASE RISK FOR ALL CANCERS, INCLUDING ESOPHAGEAL AND COLON CANCER.  Follow a HIGH FIBER/LOW FAT DIET. AVOID ITEMS THAT CAUSE BLOATING. See info below.  USE PREPARATION H FOUR TIMES  A DAY IF NEEDED TO RELIEVE RECTAL PAIN/PRESSURE/BLEEDING.  Next colonoscopy in 5 years. NO CHICKEN BROTH WITH OIL DROPLETS ON DAY PRIOR TO COLONOSCOPY.  Colonoscopy Care After Read the instructions outlined below and refer to this sheet in the next week. These discharge instructions provide you with general information on caring for yourself after you leave the hospital. While your treatment has been planned according to the most current medical practices available, unavoidable complications occasionally occur. If you have any problems or questions after discharge, call DR. Tehya Leath, [phone].  ACTIVITY  You may resume your regular activity, but move at a slower pace for the next 24 hours.   Take frequent rest periods for the next 24 hours.   Walking will help get rid of the air and reduce the bloated feeling in your belly (abdomen).   No driving for 24 hours (because of the medicine (anesthesia) used during the test).   You may shower.   Do not sign any important legal documents or operate any machinery for 24 hours (because of the anesthesia used during the test).    NUTRITION  Drink plenty of fluids.   You may resume your normal diet as instructed by your doctor.   Begin with a light meal and progress to your normal diet. Heavy or fried foods are harder to digest and may make you feel sick to your stomach (nauseated).   Avoid alcoholic beverages for 24  hours or as instructed.    MEDICATIONS  You may resume your normal medications.   WHAT YOU CAN EXPECT TODAY  Some feelings of bloating in the abdomen.   Passage of more gas than usual.   Spotting of blood in your stool or on the toilet paper  .  IF YOU HAD POLYPS REMOVED DURING THE COLONOSCOPY:  Eat a soft diet IF YOU HAVE NAUSEA, BLOATING, ABDOMINAL PAIN, OR VOMITING.    FINDING OUT THE RESULTS OF YOUR TEST Not all test results are available during your visit. DR. Oneida Alar WILL CALL YOU WITHIN 7 DAYS OF YOUR PROCEDUE WITH YOUR RESULTS. Do not assume everything is normal if you have not heard from DR. Amparo Donalson IN ONE WEEK, CALL HER OFFICE AT [phone].  SEEK IMMEDIATE MEDICAL ATTENTION AND CALL THE OFFICE: [phone] IF:  You have more than a spotting of blood in your stool.   Your belly is swollen (abdominal distention).   You are nauseated or vomiting.   You have a temperature over 101F.   You have abdominal pain or discomfort that is severe or gets worse throughout the day.  High-Fiber Diet A high-fiber diet changes your normal diet to include more whole grains, legumes, fruits, and vegetables. Changes in the diet involve replacing refined carbohydrates with unrefined foods. The calorie level of the diet is essentially unchanged. The Dietary Reference Intake (recommended amount) for adult males is 38 grams per day. For adult females, it is 25 grams per day.  Pregnant and lactating women should consume 28 grams of fiber per day. Fiber is the intact part of a plant that is not broken down during digestion. Functional fiber is fiber that has been isolated from the plant to provide a beneficial effect in the body. PURPOSE  Increase stool bulk.   Ease and regulate bowel movements.   Lower cholesterol.   REDUCE RISK OF COLON CANCER  INDICATIONS THAT YOU NEED MORE FIBER  Constipation and hemorrhoids.   Uncomplicated diverticulosis (intestine condition) and irritable  bowel syndrome.   Weight management.   As a protective measure against hardening of the arteries (atherosclerosis), diabetes, and cancer.   GUIDELINES FOR INCREASING FIBER IN THE DIET  Start adding fiber to the diet slowly. A gradual increase of about 5 more grams (2 slices of whole-wheat bread, 2 servings of most fruits or vegetables, or 1 bowl of high-fiber cereal) per day is best. Too rapid an increase in fiber may result in constipation, flatulence, and bloating.   Drink enough water and fluids to keep your urine clear or pale yellow. Water, juice, or caffeine-free drinks are recommended. Not drinking enough fluid may cause constipation.   Eat a variety of high-fiber foods rather than one type of fiber.   Try to increase your intake of fiber through using high-fiber foods rather than fiber pills or supplements that contain small amounts of fiber.   The goal is to change the types of food eaten. Do not supplement your present diet with high-fiber foods, but replace foods in your present diet.   INCLUDE A VARIETY OF FIBER SOURCES  Replace refined and processed grains with whole grains, canned fruits with fresh fruits, and incorporate other fiber sources. White rice, white breads, and most bakery goods contain little or no fiber.   Brown whole-grain rice, buckwheat oats, and many fruits and vegetables are all good sources of fiber. These include: broccoli, Brussels sprouts, cabbage, cauliflower, beets, sweet potatoes, white potatoes (skin on), carrots, tomatoes, eggplant, squash, berries, fresh fruits, and dried fruits.   Cereals appear to be the richest source of fiber. Cereal fiber is found in whole grains and bran. Bran is the fiber-rich outer coat of cereal grain, which is largely removed in refining. In whole-grain cereals, the bran remains. In breakfast cereals, the largest amount of fiber is found in those with "bran" in their names. The fiber content is sometimes indicated on the  label.   You may need to include additional fruits and vegetables each day.   In baking, for 1 cup white flour, you may use the following substitutions:   1 cup whole-wheat flour minus 2 tablespoons.   1/2 cup white flour plus 1/2 cup whole-wheat flour.   Diverticulosis Diverticulosis is a common condition that develops when small pouches (diverticula) form in the wall of the colon. The risk of diverticulosis increases with age. It happens more often in people who eat a low-fiber diet. Most individuals with diverticulosis have no symptoms. Those individuals with symptoms usually experience belly (abdominal) pain, constipation, or loose stools (diarrhea).  HOME CARE INSTRUCTIONS  Increase the amount of fiber in your diet as directed by your caregiver or dietician. This may reduce symptoms of diverticulosis.   Drink at least 6 to 8 glasses of water each day to prevent constipation.   Try not to strain when you have a bowel movement.   Avoiding nuts and seeds to prevent complications is  NOT CLINICALLY SIGNIFICANT.  FOODS HAVING HIGH FIBER CONTENT INCLUDE:  Fruits. Apple, peach, pear, tangerine, raisins, prunes.   Vegetables. Brussels sprouts, asparagus, broccoli, cabbage, carrot, cauliflower, romaine lettuce, spinach, summer squash, tomato, winter squash, zucchini.   Starchy Vegetables. Baked beans, kidney beans, lima beans, split peas, lentils, potatoes (with skin).   Grains. Whole wheat bread, brown rice, bran flake cereal, plain oatmeal, white rice, shredded wheat, bran muffins.   SEEK IMMEDIATE MEDICAL CARE IF:  You develop increasing pain or severe bloating.   You have an oral temperature above 101F.   You develop vomiting or bowel movements that are bloody or black.   Hemorrhoids Hemorrhoids are dilated (enlarged) veins around the rectum. Sometimes clots will form in the veins. This makes them swollen and painful. These are called thrombosed hemorrhoids. Causes of  hemorrhoids include:  Constipation.   Straining to have a bowel movement.   HEAVY LIFTING  HOME CARE INSTRUCTIONS  Eat a well balanced diet and drink 6 to 8 glasses of water every day to avoid constipation. You may also use a bulk laxative.   Avoid straining to have bowel movements.   Keep anal area dry and clean.   Do not use a donut shaped pillow or sit on the toilet for long periods. This increases blood pooling and pain.   Move your bowels when your body has the urge; this will require less straining and will decrease pain and pressure.

## 2016-01-04 NOTE — H&P (Addendum)
Primary Care Physician:  Abran Richard, MD Primary Gastroenterologist:  Dr. Oneida Alar  Pre-Procedure History & Physical: HPI:  Luis Hardin is a 50 y.o. male here for [place]. RARE BLOOD ON TISSUE AFTER HE WIPES TOO MUCH.  Past Medical History:  Diagnosis Date  . Dry cough   . GERD (gastroesophageal reflux disease)   . H/O hiatal hernia   . Hyperlipidemia   . Hypertension   . Inguinal hernia    RIGHT  . Tendonitis    KNEES    Past Surgical History:  Procedure Laterality Date  . ESOPHAGOGASTRODUODENOSCOPY  11/2007   Dhiflet: ?short segment Barrett's endoscopically but biopsies did not confirm.  Bx "eosinophilic esophagitis, acute and chronic inflammation of distal esophagus and gastric cardia"  . ESOPHAGOGASTRODUODENOSCOPY  03/2003   Shiflett: ?short segment Barrett's, biopsy showed chronic carditis.   Marland Kitchen HERNIA REPAIR    . INGUINAL HERNIA REPAIR Right 02/12/2013   Procedure: RIGHT INGUINAL HERNIA REPAIR;  Surgeon: Harl Bowie, MD;  Location: [facility];  Service: General;  Laterality: Right;  . INSERTION OF MESH N/A 02/12/2013   Procedure: INSERTION OF MESH;  Surgeon: Harl Bowie, MD;  Location: [facility];  Service: General;  Laterality: N/A;  . SHOULDER SURGERY     L SHOULDER  . TUMOR REMOVED     LEFT SIDE OF NECK (BENIGN)    Prior to Admission medications   Medication Sig Start Date End Date Taking? Authorizing Provider  diclofenac (VOLTAREN) 75 MG EC tablet Take 75 mg by mouth 2 (two) times daily as needed (for pain in foot related to bone spur).  11/02/15  Yes Historical Provider, MD  lisinopril (PRINIVIL,ZESTRIL) 40 MG tablet Take 40 mg by mouth daily with breakfast.    Yes Historical Provider, MD  Multiple Vitamin (MULTIVITAMIN WITH MINERALS) TABS tablet Take 1 tablet by mouth daily.   Yes Historical Provider, MD  omega-3 acid ethyl esters (LOVAZA) 1 G capsule Take 1 g by mouth 3 (three) times daily.    Yes Historical Provider, MD  omeprazole  (PRILOSEC) 20 MG capsule Take 20 mg by mouth daily.   Yes Historical Provider, MD  polyethylene glycol-electrolytes (TRILYTE) 420 g solution Take 4,000 mLs by mouth as directed. 12/17/15  Yes Danie Binder, MD    Allergies as of 12/17/2015  . (No Known Allergies)    Family History  Problem Relation Age of Onset  . Cancer Father     lung  . Cancer Brother     skin  . Cancer Paternal Grandfather     mouth  . Colon cancer Neg Hx     Social History   Social History  . Marital status: Married    Spouse name: N/A  . Number of children: N/A  . Years of education: N/A   Occupational History  . textiles    Social History Main Topics  . Smoking status: Former Smoker    Quit date: 02/01/1988  . Smokeless tobacco: Never Used  . Alcohol use No  . Drug use: No  . Sexual activity: Not on file   Other Topics Concern  . Not on file   Social History Narrative  . No narrative on file    Review of Systems: See HPI, otherwise negative ROS   Physical Exam: BP 124/82   Pulse 72   Temp 97.9 F (36.6 C) (Oral)   Resp 15   SpO2 100%  General:   Alert,  pleasant and cooperative in NAD Head:  Normocephalic and atraumatic. Neck:  Supple; Lungs:  Clear throughout to auscultation.    Heart:  Regular rate and rhythm. Abdomen:  Soft, nontender and nondistended. Normal bowel sounds, without guarding, and without rebound.   Neurologic:  Alert and  oriented x4;  grossly normal neurologically.  Impression/Plan:     SCREENING  Plan:  1. TCS TODAY. DISCUSSED PROCEDURE, BENEFITS, & RISKS: < 1% chance of medication reaction, bleeding, perforation, or rupture of spleen/liver.

## 2016-01-04 NOTE — Op Note (Signed)
[facility] Patient Name: Luis Hardin Procedure Date: 01/04/2016 1:29 PM MRN: [id number] Date of Birth: Jun 22, 1965 Attending MD: Barney Drain , MD CSN: [id number] Age: 50 Admit Type: Outpatient Procedure:                Colonoscopy, SCREENING Indications:              Screening for colorectal malignant neoplasm Providers:                Barney Drain, MD, Janeece Riggers, RN, Randa Spike,                            Technician Referring MD:             Abran Richard Medicines:                Meperidine 100 mg IV, Midazolam 8 mg IV Complications:            No immediate complications. Estimated Blood Loss:     Estimated blood loss: none. Procedure:                Pre-Anesthesia Assessment:                           - Prior to the procedure, a History and Physical                            was performed, and patient medications and                            allergies were reviewed. The patient's tolerance of                            previous anesthesia was also reviewed. The risks                            and benefits of the procedure and the sedation                            options and risks were discussed with the patient.                            All questions were answered, and informed consent                            was obtained. Prior Anticoagulants: The patient has                            taken previous NSAID medication. ASA Grade                            Assessment: II - A patient with mild systemic                            disease. After reviewing the risks and benefits,  the patient was deemed in satisfactory condition to                            undergo the procedure. After obtaining informed                            consent, the colonoscope was passed under direct                            vision. Throughout the procedure, the patient's                            blood pressure, pulse, and oxygen saturations were                        monitored continuously. The [id number]                            scope was introduced through the anus and advanced                            to the the cecum, identified by appendiceal orifice                            and ileocecal valve. The ileocecal valve,                            appendiceal orifice, and rectum were photographed.                            The colonoscopy was somewhat difficult due to a                            tortuous colon and the patient's discomfort during                            the procedure. Successful completion of the                            procedure was aided by increasing the dose of                            sedation medication, straightening and shortening                            the scope to obtain bowel loop reduction and                            COLOWRAP. The patient tolerated the procedure                            fairly well. The quality of the bowel preparation  was fair. Scope In: 1:57:55 PM Scope Out: 2:18:51 PM Scope Withdrawal Time: 0 hours 17 minutes 20 seconds  Total Procedure Duration: 0 hours 20 minutes 56 seconds  Findings:      Many small and large-mouthed diverticula were found in the sigmoid colon       and descending colon.      The recto-sigmoid colon was moderately redundant.      Internal hemorrhoids were found. The hemorrhoids were small.      External hemorrhoids were found. The hemorrhoids were moderate. Impression:               - Preparation of the colon was fair DUE TO OILY                            RESIDUE IN COLON                           - MILD TO MODERATE Diverticulosis in the sigmoid                            colon and in the descending colon.                           - Redundant LEFT colon.                           - SMALL Internal hemorrhoids.                           - MODERATE External hemorrhoids. Moderate Sedation:       Moderate (conscious) sedation was administered by the endoscopy nurse       and supervised by the endoscopist. The following parameters were       monitored: oxygen saturation, heart rate, blood pressure, and response       to care. Total physician intraservice time was 39 minutes. Recommendation:           - High fiber diet.                           - Continue present medications.                           - Repeat colonoscopy in 5 years for surveillance                            WITH PHENERGAN 12.5 MG IV IN PREOP.                           - Patient has a contact number available for                            emergencies. The signs and symptoms of potential                            delayed complications were discussed with the  patient. Return to normal activities tomorrow.                            Written discharge instructions were provided to the                            patient. Procedure Code(s):        --- Professional ---                           [phone], Colonoscopy, flexible; diagnostic, including                            collection of specimen(s) by brushing or washing,                            when performed (separate procedure)                           99152, Moderate sedation services provided by the                            same physician or other qualified health care                            professional performing the diagnostic or                            therapeutic service that the sedation supports,                            requiring the presence of an independent trained                            observer to assist in the monitoring of the                            patient's level of consciousness and physiological                            status; initial 15 minutes of intraservice time,                            patient age 73 years or older                           [phone], Moderate sedation services; each  additional                            15 minutes intraservice time                           99153, Moderate sedation services; each additional  15 minutes intraservice time Diagnosis Code(s):        --- Professional ---                           Z12.11, Encounter for screening for malignant                            neoplasm of colon                           K64.4, Residual hemorrhoidal skin tags                           K64.8, Other hemorrhoids                           K57.30, Diverticulosis of large intestine without                            perforation or abscess without bleeding                           Q43.8, Other specified congenital malformations of                            intestine CPT copyright 2016 [employer]. All rights reserved. The codes documented in this report are preliminary and upon coder review may  be revised to meet current compliance requirements. Barney Drain, MD Barney Drain, MD 01/04/2016 2:30:48 PM This report has been signed electronically. Number of Addenda: 0

## 2016-01-08 ENCOUNTER — Encounter (HOSPITAL_COMMUNITY): Payer: Self-pay | Admitting: Gastroenterology

## 2017-04-29 ENCOUNTER — Emergency Department (HOSPITAL_COMMUNITY)
Admission: EM | Admit: 2017-04-29 | Discharge: 2017-04-29 | Disposition: A | Discharge status: Home / Self Care | Payer: BLUE CROSS/BLUE SHIELD | Attending: Emergency Medicine | Admitting: Emergency Medicine

## 2017-04-29 ENCOUNTER — Encounter (HOSPITAL_COMMUNITY): Payer: Self-pay | Admitting: Emergency Medicine

## 2017-04-29 ENCOUNTER — Other Ambulatory Visit: Payer: Self-pay

## 2017-04-29 DIAGNOSIS — S30860A Insect bite (nonvenomous) of lower back and pelvis, initial encounter: Secondary | ICD-10-CM | POA: Insufficient documentation

## 2017-04-29 DIAGNOSIS — Y929 Unspecified place or not applicable: Secondary | ICD-10-CM | POA: Diagnosis not present

## 2017-04-29 DIAGNOSIS — W57XXXA Bitten or stung by nonvenomous insect and other nonvenomous arthropods, initial encounter: Secondary | ICD-10-CM | POA: Insufficient documentation

## 2017-04-29 DIAGNOSIS — Z79899 Other long term (current) drug therapy: Secondary | ICD-10-CM | POA: Diagnosis not present

## 2017-04-29 DIAGNOSIS — Y998 Other external cause status: Secondary | ICD-10-CM | POA: Insufficient documentation

## 2017-04-29 DIAGNOSIS — I10 Essential (primary) hypertension: Secondary | ICD-10-CM | POA: Diagnosis not present

## 2017-04-29 DIAGNOSIS — L989 Disorder of the skin and subcutaneous tissue, unspecified: Secondary | ICD-10-CM

## 2017-04-29 DIAGNOSIS — Z87891 Personal history of nicotine dependence: Secondary | ICD-10-CM | POA: Diagnosis not present

## 2017-04-29 DIAGNOSIS — Y9389 Activity, other specified: Secondary | ICD-10-CM | POA: Insufficient documentation

## 2017-04-29 MED ORDER — DOXYCYCLINE HYCLATE 100 MG PO CAPS: 100.0000 mg | ORAL_CAPSULE | Freq: Two times a day (BID) | ORAL | 0 refills | Status: DC

## 2017-04-29 MED ORDER — DIPHENHYDRAMINE HCL 25 MG PO CAPS
25.0000 mg | ORAL_CAPSULE | Freq: Once | ORAL | Status: AC
  Administered 2017-04-29: 25 mg via ORAL
  Filled 2017-04-29: qty 1

## 2017-04-29 NOTE — ED Provider Notes (Signed)
[facility] EMERGENCY DEPARTMENT Provider Note   CSN: [id number] Arrival date & time: 04/29/17  1727     History   Chief Complaint Chief Complaint  Patient presents with  . Tick Removal    HPI Luis Hardin is a 52 y.o. male.  HPI  52 year old man presents today stating that he found a tick in left inguinal region yesterday.  He removed the tick by placing tweezers near the head.  He states that he slowly withdrew it.  He has removed ticks from himself in the past.  Today he noticed increased redness and swelling.  He has not noted any discharge or fevers.  He has not had any rash present anywhere else.  He is otherwise healthy but does take blood pressure medication.  Past Medical History:  Diagnosis Date  . Dry cough   . GERD (gastroesophageal reflux disease)   . H/O hiatal hernia   . Hyperlipidemia   . Hypertension   . Inguinal hernia    RIGHT  . Tendonitis    KNEES    Patient Active Problem List   Diagnosis Date Noted  . Colon cancer screening   . GERD (gastroesophageal reflux disease) 12/17/2015  . Rectal bleeding 12/17/2015  . Hemorrhoids 12/17/2015  . Right inguinal hernia 01/28/2013    Past Surgical History:  Procedure Laterality Date  . COLONOSCOPY N/A 01/04/2016   Procedure: COLONOSCOPY;  Surgeon: Danie Binder, MD;  [facility];  Service: Endoscopy;  Laterality: N/A;  1:00 PM  . ESOPHAGOGASTRODUODENOSCOPY  11/2007   Dhiflet: ?short segment Barrett's endoscopically but biopsies did not confirm.  Bx "eosinophilic esophagitis, acute and chronic inflammation of distal esophagus and gastric cardia"  . ESOPHAGOGASTRODUODENOSCOPY  03/2003   Shiflett: ?short segment Barrett's, biopsy showed chronic carditis.   Marland Kitchen HERNIA REPAIR    . INGUINAL HERNIA REPAIR Right 02/12/2013   Procedure: RIGHT INGUINAL HERNIA REPAIR;  Surgeon: Harl Bowie, MD;  Location: [facility] ORS;  Service: General;  Laterality: Right;  . INSERTION OF MESH N/A 02/12/2013   Procedure: INSERTION OF MESH;  Surgeon: Harl Bowie, MD;  Location: [facility] ORS;  Service: General;  Laterality: N/A;  . SHOULDER SURGERY     L SHOULDER  . TUMOR REMOVED     LEFT SIDE OF NECK (BENIGN)        Home Medications    Prior to Admission medications   Medication Sig Start Date End Date Taking? Authorizing Provider  diclofenac (VOLTAREN) 75 MG EC tablet Take 75 mg by mouth 2 (two) times daily as needed (for pain in foot related to bone spur).  11/02/15   [provider]  doxycycline (VIBRAMYCIN) 100 MG capsule Take 1 capsule (100 mg total) by mouth 2 (two) times daily. 04/29/17   Pattricia Boss, MD  lisinopril (PRINIVIL,ZESTRIL) 40 MG tablet Take 40 mg by mouth daily with breakfast.     [provider]  Multiple Vitamin (MULTIVITAMIN WITH MINERALS) TABS tablet Take 1 tablet by mouth daily.    [provider]  omega-3 acid ethyl esters (LOVAZA) 1 G capsule Take 1 g by mouth 3 (three) times daily.     [provider]  omeprazole (PRILOSEC) 20 MG capsule Take 20 mg by mouth daily.    [provider]    Family History Family History  Problem Relation Age of Onset  . Cancer Father        lung  . Cancer Brother        skin  .  Cancer Paternal Grandfather        mouth  . Colon cancer Neg Hx     Social History Social History   Tobacco Use  . Smoking status: Former Smoker    Last attempt to quit: 02/01/1988    Years since quitting: 29.2  . Smokeless tobacco: Never Used  Substance Use Topics  . Alcohol use: No  . Drug use: No     Allergies   Patient has no known allergies.   Review of Systems Review of Systems  Skin: Positive for rash.  All other systems reviewed and are negative.    Physical Exam Updated Vital Signs BP (!) 163/101 (BP Location: Right Arm)   Pulse 86   Temp 98.2 F (36.8 C) (Oral)   Resp 16   Ht 1.753 m (5\' 9" )   Wt 97.5 kg (215 lb)   SpO2 100%   BMI 31.75 kg/m   Physical Exam    Constitutional: He is oriented to person, place, and time. He appears well-developed.  HENT:  Head: Normocephalic and atraumatic.  Right Ear: External ear normal.  Left Ear: External ear normal.  Nose: Nose normal.  Eyes: EOM are normal.  Neck: No tracheal deviation present.  Cardiovascular: Normal rate.  Pulmonary/Chest: Effort normal.  Abdominal: Soft.  Musculoskeletal: Normal range of motion.  Neurological: He is alert and oriented to person, place, and time.  Skin: Skin is warm and dry. Rash noted.  7 cm x 7 cm erythematous lesion left groin.  Area examined.  No evidence of retained foreign body.  No fluctuance.  No lesions are noted otherwise on the skin  Psychiatric: He has a normal mood and affect. His behavior is normal.  Nursing note and vitals reviewed.    ED Treatments / Results  Labs (all labs ordered are listed, but only abnormal results are displayed) Labs Reviewed - No data to display  EKG None  Radiology No results found.  Procedures Procedures (including critical care time)  Medications Ordered in ED Medications  diphenhydrAMINE (BENADRYL) capsule 25 mg (has no administration in time range)     Initial Impression / Assessment and Plan / ED Course  I have reviewed the triage vital signs and the nursing notes.  Pertinent labs & imaging results that were available during my care of the patient were reviewed by me and considered in my medical decision making (see chart for details).      Final Clinical Impressions(s) / ED Diagnoses   Final diagnoses:  Tick bite, initial encounter  Skin lesion on examination  Hypertension, unspecified type    ED Discharge Orders        Ordered    doxycycline (VIBRAMYCIN) 100 MG capsule  2 times daily     04/29/17 1756       Pattricia Boss, MD 04/29/17 1800

## 2017-04-29 NOTE — ED Triage Notes (Signed)
Patient states he removed a tick from his groin area yesterday and area is now red, swollen, and warm to the touch.

## 2017-04-29 NOTE — Discharge Instructions (Addendum)
You are being treated for potential tick borne illness This may be a local allergic reaction, please return if rash spreads, you become breath, or lightheaded Your blood pressure is elevated today.  This rechecked in a outpatient setting.

## 2021-01-28 ENCOUNTER — Encounter: Payer: Self-pay | Admitting: *Deleted

## 2021-01-31 DIAGNOSIS — C801 Malignant (primary) neoplasm, unspecified: Secondary | ICD-10-CM

## 2021-01-31 HISTORY — DX: Malignant (primary) neoplasm, unspecified: C80.1

## 2021-02-11 ENCOUNTER — Telehealth: Payer: Self-pay | Admitting: Internal Medicine

## 2021-02-11 NOTE — Telephone Encounter (Signed)
We don't have this patient scheduled for anything with [place]

## 2021-02-11 NOTE — Telephone Encounter (Signed)
Please call patient wife, she wants procedure code that will be used for insurance purposes

## 2021-04-01 ENCOUNTER — Encounter: Payer: Self-pay | Admitting: Internal Medicine

## 2021-05-04 ENCOUNTER — Encounter: Payer: Self-pay | Admitting: Internal Medicine

## 2021-07-13 ENCOUNTER — Ambulatory Visit: Payer: BLUE CROSS/BLUE SHIELD | Admitting: Gastroenterology

## 2021-07-13 ENCOUNTER — Encounter: Payer: Self-pay | Admitting: *Deleted

## 2021-07-13 ENCOUNTER — Ambulatory Visit: Payer: 59 | Admitting: Gastroenterology

## 2021-07-13 ENCOUNTER — Telehealth: Payer: Self-pay | Admitting: *Deleted

## 2021-07-13 ENCOUNTER — Encounter: Payer: Self-pay | Admitting: Gastroenterology

## 2021-07-13 VITALS — BP 126/80 | HR 74 | Temp 97.6°F | Ht 69.0 in | Wt 214.4 lb

## 2021-07-13 DIAGNOSIS — K219 Gastro-esophageal reflux disease without esophagitis: Secondary | ICD-10-CM | POA: Diagnosis not present

## 2021-07-13 DIAGNOSIS — K649 Unspecified hemorrhoids: Secondary | ICD-10-CM | POA: Diagnosis not present

## 2021-07-13 DIAGNOSIS — Z86010 Personal history of colonic polyps: Secondary | ICD-10-CM | POA: Diagnosis not present

## 2021-07-13 MED ORDER — PEG 3350-KCL-NA BICARB-NACL 420 G PO SOLR: ORAL | 0 refills | Status: DC

## 2021-07-13 NOTE — Patient Instructions (Signed)
We are scheduling you for colonoscopy in the near future with Dr. Abbey Chatters.  Our scheduling team will send your prep instructions in the mail.  To prevent diverticulitis given that you have known diverticulosis, ensuring that you have enough fiber in your diet is essential.  For your hemorrhoid, continue to use Preparation H over-the-counter as needed.  If you have any increased bleeding, or increased frequency of your symptoms we do offer hemorrhoid banding for your internal hemorrhoids in the office.  Just reach out to schedule if you are interested.  It was a pleasure to meet you and your wife today!  I want to create trusting relationships with patients. If you receive a survey regarding your visit,  I greatly appreciate you taking time to fill this out on paper or through your MyChart. I value your feedback.  Venetia Night, MSN, FNP-BC, AGACNP-BC [facility]

## 2021-07-13 NOTE — Telephone Encounter (Signed)
Per [facility] website "This member's plan does not currently require notification or prior-authorization through the [employer] Notification or Prior-Authorization Program"

## 2021-07-13 NOTE — Progress Notes (Signed)
GI Office Note    Referring Provider: Abran Richard, MD Primary Care Physician:  Abran Richard, MD  Primary Gastroenterologist: Dr. Abbey Chatters  Chief Complaint   Chief Complaint  Patient presents with   Colonoscopy     History of Present Illness   Phillp Dolores is a 56 y.o. male presenting today at the request of Abran Richard, MD for colonoscopy and evaluation of rectal bleeding. Patient has a history of known hemorrhoids.   Last seen in the office 12/17/2015 for evaluation prior to colonoscopy due to hemorrhoids and intermittent rectal bleeding.  He complained of anal itching, prior use of Preparation H which would help.  He reported most constant fecal soilage with anal itching without relief with use of hydrocortisone creams.  He reported small-volume BRBPR with wiping.  Also with complaint of heartburn, no dysphagia.  Previously on omeprazole for years. Reported he has had an EGD in the past that revealed ulcerative esophagitis, and had 2 done since that one that were normal.  He was set up for colonoscopy with possible hemorrhoid banding.  EGD October 2009-several tongues of columnar epithelium, from the Z-line suspicious for short segment Barrett's, biopsy negative for Barrett's but consistent with eosinophilic esophagitis, acute and chronic elevation of distal esophagus and gastric cardia  EGD February 2005-question of short segment Barrett's, biopsy with chronic carditis, history of erosive esophagitis  Last colonoscopy 01/04/2016 -oily residue in colon, mild to moderate diverticulosis in the sigmoid and descending colon, redundant left colon, small internal hemorrhoids, moderate external hemorrhoids.  Repeat recommended in 5 years.  No mention of hemorrhoid banding.  Of note there is a recommendation for patient to have Phenergan 12.5 mg IV in preop.   Today: Hemorrhoids - only bleeding if wiping to much. Uses otc preparation H. Symptoms are intermittent. No incontinence or  fecal soilage.  Denies unintentional weight loss, lack appetite, early satiety, dysphagia, abdominal pain, constipation, chest pain, shortness of breath, lower extremity edema.  GERD - doing well on omeprazole. Does well as long as he doesn't overeat. Pastas, bbq chips, chocolate bother him. Takes advil or aleve as needed.   Takes daily multivitamin without iron in it. Gets quesy if he does not eat something for a while. He thinks they may have given him something with his last colonoscopy due to this nausea. (Feels like this may be why they recommended Phenergan preop for this next colonoscopy)  Past Medical History:  Diagnosis Date   Dry cough    GERD (gastroesophageal reflux disease)    H/O hiatal hernia    Hyperlipidemia    Hypertension    Inguinal hernia    RIGHT   Tendonitis    KNEES    Past Surgical History:  Procedure Laterality Date   COLONOSCOPY N/A 01/04/2016   Procedure: COLONOSCOPY;  Surgeon: Danie Binder, MD;  [facility];  Service: Endoscopy;  Laterality: N/A;  1:00 PM   ESOPHAGOGASTRODUODENOSCOPY  11/2007   Dhiflet: ?short segment Barrett's endoscopically but biopsies did not confirm.  Bx "eosinophilic esophagitis, acute and chronic inflammation of distal esophagus and gastric cardia"   ESOPHAGOGASTRODUODENOSCOPY  03/2003   Shiflett: ?short segment Barrett's, biopsy showed chronic carditis.    HERNIA REPAIR     INGUINAL HERNIA REPAIR Right 02/12/2013   Procedure: RIGHT INGUINAL HERNIA REPAIR;  Surgeon: Harl Bowie, MD;  Location: [facility];  Service: General;  Laterality: Right;   INSERTION OF MESH N/A 02/12/2013   Procedure: INSERTION OF MESH;  Surgeon: Nathaneil Canary  Lynann Beaver, MD;  Location: [facility];  Service: General;  Laterality: N/A;   SHOULDER SURGERY     L SHOULDER   TUMOR REMOVED     LEFT SIDE OF NECK (BENIGN)    Current Outpatient Medications  Medication Sig Dispense Refill   lisinopril (PRINIVIL,ZESTRIL) 40 MG tablet Take 40 mg by mouth  daily with breakfast.      Multiple Vitamin (MULTIVITAMIN WITH MINERALS) TABS tablet Take 1 tablet by mouth daily.     omega-3 acid ethyl esters (LOVAZA) 1 G capsule Take 1 g by mouth 3 (three) times daily.      omeprazole (PRILOSEC) 20 MG capsule Take 20 mg by mouth daily.     triamcinolone ointment (KENALOG) 0.1 % Apply topically 2 (two) times daily.     diclofenac (VOLTAREN) 75 MG EC tablet Take 75 mg by mouth 2 (two) times daily as needed (for pain in foot related to bone spur).  (Patient not taking: Reported on 07/13/2021)     doxycycline (VIBRAMYCIN) 100 MG capsule Take 1 capsule (100 mg total) by mouth 2 (two) times daily. (Patient not taking: Reported on 07/13/2021) 24 capsule 0   No current facility-administered medications for this visit.    Allergies as of 07/13/2021   (No Known Allergies)    Family History  Problem Relation Age of Onset   Cancer Father        lung   Cancer Brother        skin   Cancer Paternal Grandfather        mouth    Social History   Socioeconomic History   Marital status: Married    Spouse name: Not on file   Number of children: Not on file   Years of education: Not on file   Highest education level: Not on file  Occupational History   Occupation: textiles  Tobacco Use   Smoking status: Former    Types: Cigarettes    Quit date: 02/01/1988    Years since quitting: 33.4   Smokeless tobacco: Never  Substance and Sexual Activity   Alcohol use: No   Drug use: No   Sexual activity: Not on file  Other Topics Concern   Not on file  Social History Narrative   Not on file   Social Determinants of Health   Financial Resource Strain: Not on file  Food Insecurity: Not on file  Transportation Needs: Not on file  Physical Activity: Not on file  Stress: Not on file  Social Connections: Not on file  Intimate Partner Violence: Not on file     Review of Systems   Gen: Denies any fever, chills, fatigue, weight loss, lack of appetite.  CV:  Denies chest pain, heart palpitations, peripheral edema, syncope.  Resp: Denies shortness of breath at rest or with exertion. Denies wheezing or cough.  GI: See HPI GU : Denies urinary burning, urinary frequency, urinary hesitancy MS: Denies joint pain, muscle weakness, cramps, or limitation of movement.  Derm: Denies rash, itching, dry skin Psych: Denies depression, anxiety, memory loss, and confusion Heme: Denies bruising, bleeding, and enlarged lymph nodes.   Physical Exam   BP 126/80   Pulse 74   Temp 97.6 F (36.4 C) (Temporal)   Ht '5\' 9"'$  (1.753 m)   Wt 214 lb 6.4 oz (97.3 kg)   BMI 31.66 kg/m   General:   Alert and oriented. Pleasant and cooperative. Well-nourished and well-developed.  Head:  Normocephalic and atraumatic. Eyes:  Without icterus,  sclera clear and conjunctiva pink.  Ears:  Normal auditory acuity. Mouth:  No deformity or lesions, oral mucosa pink.  Lungs:  Clear to auscultation bilaterally. No wheezes, rales, or rhonchi. No distress.  Heart:  S1, S2 present without murmurs appreciated.  Abdomen:  +BS, soft, non-distended.  Mild tenderness to periumbilical region, small periumbilical hernia noted.  No HSM noted. No guarding or rebound.  Multiple small ping-pong ball sized fatty growths along his abdomen. Rectal:  Deferred  Msk:  Symmetrical without gross deformities. Normal posture. Extremities:  Without edema. Neurologic:  Alert and  oriented x4;  grossly normal neurologically. Skin:  Intact without significant lesions or rashes. Psych:  Alert and cooperative. Normal mood and affect.   Assessment   Deitrich Steve is a 56 y.o. male with a history of GERD, HTN, HLD, and chronic intermittent rectal bleeding related to hemorrhoids presenting today for evaluation of rectal bleeding prior to scheduling colonoscopy.  Hemorrhoids: Primarily external, small internal noted on prior colonoscopy.  Symptoms are intermittent and controlled with over-the-counter  Preparation H.  Discussed that if internal hemorrhoids become more bothersome or has more frequent bleeding that we can offer hemorrhoid banding in the office.  Advised that external hemorrhoids would have to be surgically removed.  History of colon polyps: Last colonoscopy in 2017 that noted only residue throughout, diverticulosis in the sigmoid and descending colon, redundant left colon, small internal hemorrhoids, moderate external hemorrhoids.  No alarm symptoms present.  We will proceed with colonoscopy with propofol with Dr. Abbey Chatters in near future  GERD: Well-controlled on omeprazole 20 mg daily, 30 minutes prior to breakfast.  No alarm symptoms present.  Denies frequent breakthrough symptoms.  PLAN   Proceed with colonoscopy with propofol by Dr. Abbey Chatters  in near future: the risks, benefits, and alternatives have been discussed with the patient in detail. The patient states understanding and desires to proceed.  ASA 2 Continue preparation H as needed.  Continue omeprazole 20 mg  daily, 30 minutes prior to breakfast.  Follow-up in the office for hemorrhoid banding if bleeding worsens or internal hemorrhoids become more bothersome.   Venetia Night, MSN, FNP-BC, AGACNP-BC [facility]

## 2021-07-21 ENCOUNTER — Ambulatory Visit: Payer: BLUE CROSS/BLUE SHIELD | Admitting: Gastroenterology

## 2021-08-11 ENCOUNTER — Encounter (HOSPITAL_COMMUNITY): Payer: Self-pay

## 2021-08-11 ENCOUNTER — Other Ambulatory Visit: Payer: Self-pay

## 2021-08-11 ENCOUNTER — Encounter (HOSPITAL_COMMUNITY)
Admission: RE | Admit: 2021-08-11 | Discharge: 2021-08-11 | Disposition: A | Discharge status: Home / Self Care | Payer: 59 | Source: Ambulatory Visit | Attending: Internal Medicine | Admitting: Internal Medicine

## 2021-08-11 NOTE — Pre-Procedure Instructions (Signed)
Attempted pre-op phone call left voicemail for him to call back.

## 2021-08-13 ENCOUNTER — Telehealth: Payer: Self-pay | Admitting: *Deleted

## 2021-08-13 NOTE — Telephone Encounter (Signed)
Patient spouse called in. Reports pt does not have a history of colon polyps and it should be as a screening. Pt scheduled for Monday Please advise

## 2021-08-16 ENCOUNTER — Ambulatory Visit (HOSPITAL_COMMUNITY): Payer: 59 | Admitting: Anesthesiology

## 2021-08-16 ENCOUNTER — Ambulatory Visit (HOSPITAL_BASED_OUTPATIENT_CLINIC_OR_DEPARTMENT_OTHER): Payer: 59 | Admitting: Anesthesiology

## 2021-08-16 ENCOUNTER — Encounter (HOSPITAL_COMMUNITY): Payer: Self-pay

## 2021-08-16 ENCOUNTER — Encounter (HOSPITAL_COMMUNITY)
Admission: RE | Disposition: A | Discharge status: Home / Self Care | Payer: Self-pay | Source: Home / Self Care | Attending: Internal Medicine

## 2021-08-16 ENCOUNTER — Ambulatory Visit (HOSPITAL_COMMUNITY)
Admission: RE | Admit: 2021-08-16 | Discharge: 2021-08-16 | Disposition: A | Discharge status: Home / Self Care | Payer: 59 | Attending: Internal Medicine | Admitting: Internal Medicine

## 2021-08-16 DIAGNOSIS — K649 Unspecified hemorrhoids: Secondary | ICD-10-CM

## 2021-08-16 DIAGNOSIS — Z79899 Other long term (current) drug therapy: Secondary | ICD-10-CM | POA: Insufficient documentation

## 2021-08-16 DIAGNOSIS — K219 Gastro-esophageal reflux disease without esophagitis: Secondary | ICD-10-CM | POA: Insufficient documentation

## 2021-08-16 DIAGNOSIS — K635 Polyp of colon: Secondary | ICD-10-CM

## 2021-08-16 DIAGNOSIS — K5730 Diverticulosis of large intestine without perforation or abscess without bleeding: Secondary | ICD-10-CM

## 2021-08-16 DIAGNOSIS — Z8371 Family history of colonic polyps: Secondary | ICD-10-CM | POA: Insufficient documentation

## 2021-08-16 DIAGNOSIS — K648 Other hemorrhoids: Secondary | ICD-10-CM

## 2021-08-16 DIAGNOSIS — Z87891 Personal history of nicotine dependence: Secondary | ICD-10-CM | POA: Insufficient documentation

## 2021-08-16 DIAGNOSIS — K625 Hemorrhage of anus and rectum: Secondary | ICD-10-CM

## 2021-08-16 DIAGNOSIS — D123 Benign neoplasm of transverse colon: Secondary | ICD-10-CM | POA: Diagnosis not present

## 2021-08-16 DIAGNOSIS — Z1211 Encounter for screening for malignant neoplasm of colon: Secondary | ICD-10-CM

## 2021-08-16 DIAGNOSIS — I10 Essential (primary) hypertension: Secondary | ICD-10-CM | POA: Insufficient documentation

## 2021-08-16 HISTORY — PX: POLYPECTOMY: SHX149

## 2021-08-16 HISTORY — PX: COLONOSCOPY WITH PROPOFOL: SHX5780

## 2021-08-16 SURGERY — COLONOSCOPY WITH PROPOFOL
Anesthesia: General

## 2021-08-16 MED ORDER — LACTATED RINGERS IV SOLN: INTRAVENOUS | Status: DC

## 2021-08-16 MED ORDER — PROPOFOL 500 MG/50ML IV EMUL
INTRAVENOUS | Status: DC | PRN
  Administered 2021-08-16: 200 ug/kg/min via INTRAVENOUS

## 2021-08-16 MED ORDER — LACTATED RINGERS IV SOLN: INTRAVENOUS | Status: DC | PRN

## 2021-08-16 MED ORDER — PROPOFOL 10 MG/ML IV BOLUS
INTRAVENOUS | Status: DC | PRN
  Administered 2021-08-16: 70 mg via INTRAVENOUS

## 2021-08-16 MED ORDER — PHENYLEPHRINE HCL (PRESSORS) 10 MG/ML IV SOLN
INTRAVENOUS | Status: DC | PRN
  Administered 2021-08-16: 160 ug via INTRAVENOUS
  Administered 2021-08-16: 80 ug via INTRAVENOUS

## 2021-08-16 MED ORDER — LIDOCAINE HCL (CARDIAC) PF 100 MG/5ML IV SOSY
PREFILLED_SYRINGE | INTRAVENOUS | Status: DC | PRN
  Administered 2021-08-16: 50 mg via INTRAVENOUS

## 2021-08-16 NOTE — Transfer of Care (Signed)
Immediate Anesthesia Transfer of Care Note  Patient: Luis Hardin  Procedure(s) Performed: COLONOSCOPY WITH PROPOFOL POLYPECTOMY INTESTINAL  Patient Location: Short Stay  Anesthesia Type:General  Level of Consciousness: awake, alert  and oriented  Airway & Oxygen Therapy: Patient Spontanous Breathing  Post-op Assessment: Report given to RN and Post -op Vital signs reviewed and stable  Post vital signs: Reviewed and stable  Last Vitals:  Vitals Value Taken Time  BP 88/57 08/16/21 0809  Temp 36.1 C 08/16/21 0807  Pulse 78 08/16/21 0809  Resp 15 08/16/21 0809  SpO2 97 % 08/16/21 0809    Last Pain:  Vitals:   08/16/21 0807  TempSrc: Oral  PainSc: 0-No pain         Complications: No notable events documented.

## 2021-08-16 NOTE — Op Note (Signed)
[facility] Patient Name: Luis Hardin Procedure Date: 08/16/2021 7:14 AM MRN: [id number] Date of Birth: 09-03-1965 Attending MD: Elon Alas. Abbey Chatters DO CSN: [id number] Age: 56 Admit Type: Outpatient Procedure:                Colonoscopy Indications:              Screening for colorectal malignant neoplasm Providers:                Elon Alas. Abbey Chatters, DO, [place] Page, Everardo Pacific, Aram Candela Referring MD:             Elon Alas. Abbey Chatters, DO Medicines:                See the Anesthesia note for documentation of the                            administered medications Complications:            No immediate complications. Estimated Blood Loss:     Estimated blood loss was minimal. Procedure:                Pre-Anesthesia Assessment:                           - The anesthesia plan was to use monitored                            anesthesia care (MAC).                           After obtaining informed consent, the colonoscope                            was passed under direct vision. Throughout the                            procedure, the patient's blood pressure, pulse, and                            oxygen saturations were monitored continuously. The                            [id number]-HQ190L ([id number]) scope was introduced through                            the anus and advanced to the the cecum, identified                            by appendiceal orifice and ileocecal valve. The                            colonoscopy was performed without difficulty. The                            patient tolerated the  procedure well. The quality                            of the bowel preparation was evaluated using the                            BBPS [facility] Bowel Preparation Scale) with scores                            of: Right Colon = 3, Transverse Colon = 3 and Left                            Colon = 3 (entire mucosa seen well with no residual                             staining, small fragments of stool or opaque                            liquid). The total BBPS score equals 9. Scope In: 7:49:53 AM Scope Out: 8:04:02 AM Scope Withdrawal Time: 0 hours 12 minutes 15 seconds  Total Procedure Duration: 0 hours 14 minutes 9 seconds  Findings:      The perianal and digital rectal examinations were normal.      Non-bleeding internal hemorrhoids were found during endoscopy.      Multiple small and large-mouthed diverticula were found in the sigmoid       colon.      Two sessile polyps were found in the transverse colon. The polyps were 4       to 5 mm in size. These polyps were removed with a cold snare. Resection       and retrieval were complete.      The exam was otherwise without abnormality. Impression:               - Non-bleeding internal hemorrhoids.                           - Diverticulosis in the sigmoid colon.                           - Two 4 to 5 mm polyps in the transverse colon,                            removed with a cold snare. Resected and retrieved.                           - The examination was otherwise normal. Moderate Sedation:      Per Anesthesia Care Recommendation:           - Patient has a contact number available for                            emergencies. The signs and symptoms of potential                            delayed complications were  discussed with the                            patient. Return to normal activities tomorrow.                            Written discharge instructions were provided to the                            patient.                           - Resume previous diet.                           - Continue present medications.                           - Await pathology results.                           - Repeat colonoscopy in 5 years for surveillance.                           - Return to GI clinic PRN. Procedure Code(s):        --- Professional ---                           [phone],  Colonoscopy, flexible; with removal of                            tumor(s), polyp(s), or other lesion(s) by snare                            technique Diagnosis Code(s):        --- Professional ---                           Z12.11, Encounter for screening for malignant                            neoplasm of colon                           K64.8, Other hemorrhoids                           K63.5, Polyp of colon                           K57.30, Diverticulosis of large intestine without                            perforation or abscess without bleeding CPT copyright 2019 American [employer] Association. All rights reserved. The codes documented in this report are preliminary and upon coder review may  be revised to meet current compliance requirements. Elon Alas. Abbey Chatters, DO [place], DO 08/16/2021 8:06:17 AM This  report has been signed electronically. Number of Addenda: 0

## 2021-08-16 NOTE — H&P (Signed)
Primary Care Physician:  Abran Richard, MD Primary Gastroenterologist:  Dr. Abbey Chatters  Pre-Procedure History & Physical: HPI:  Luis Hardin is a 56 y.o. male is here for a colonoscopy for colon cancer screening purposes.  Patient denies any family history of colorectal cancer.  No melena or hematochezia.  No abdominal pain or unintentional weight loss.  No change in bowel habits.  Overall feels well from a GI standpoint.  Past Medical History:  Diagnosis Date   Dry cough    GERD (gastroesophageal reflux disease)    H/O hiatal hernia    Hyperlipidemia    Hypertension    Inguinal hernia    RIGHT   Tendonitis    KNEES    Past Surgical History:  Procedure Laterality Date   COLONOSCOPY N/A 01/04/2016   Procedure: COLONOSCOPY;  Surgeon: Danie Binder, MD;  [facility];  Service: Endoscopy;  Laterality: N/A;  1:00 PM   ESOPHAGOGASTRODUODENOSCOPY  11/2007   Dhiflet: ?short segment Barrett's endoscopically but biopsies did not confirm.  Bx "eosinophilic esophagitis, acute and chronic inflammation of distal esophagus and gastric cardia"   ESOPHAGOGASTRODUODENOSCOPY  03/2003   Shiflett: ?short segment Barrett's, biopsy showed chronic carditis.    HERNIA REPAIR     INGUINAL HERNIA REPAIR Right 02/12/2013   Procedure: RIGHT INGUINAL HERNIA REPAIR;  Surgeon: Harl Bowie, MD;  Location: [facility];  Service: General;  Laterality: Right;   INSERTION OF MESH N/A 02/12/2013   Procedure: INSERTION OF MESH;  Surgeon: Harl Bowie, MD;  Location: [facility];  Service: General;  Laterality: N/A;   SHOULDER SURGERY     L SHOULDER   TUMOR REMOVED     LEFT SIDE OF NECK (BENIGN)    Prior to Admission medications   Medication Sig Start Date End Date Taking? Authorizing Provider  lisinopril (PRINIVIL,ZESTRIL) 40 MG tablet Take 40 mg by mouth daily with breakfast.    Yes [provider]  Multiple Vitamin (MULTIVITAMIN WITH MINERALS) TABS tablet Take 1 tablet by mouth daily.   Yes  [provider]  omega-3 acid ethyl esters (LOVAZA) 1 G capsule Take 1 g by mouth daily.   Yes [provider]  omeprazole (PRILOSEC) 20 MG capsule Take 20 mg by mouth daily.   Yes [provider]  triamcinolone ointment (KENALOG) 0.1 % Apply 1 Application topically 2 (two) times daily. 07/06/21  Yes [provider]  polyethylene glycol-electrolytes (NULYTELY) 420 g solution As directed 07/13/21   Eloise Harman, DO    Allergies as of 07/13/2021   (No Known Allergies)    Family History  Problem Relation Age of Onset   Heart disease Mother    Cancer Father        lung   Cancer Brother        skin   Colon polyps Brother    Cancer Paternal Grandfather        mouth   Colon polyps Maternal Uncle     Social History   Socioeconomic History   Marital status: Married    Spouse name: Not on file   Number of children: Not on file   Years of education: Not on file   Highest education level: Not on file  Occupational History   Occupation: textiles  Tobacco Use   Smoking status: Former    Packs/day: 0.25    Types: Cigarettes    Quit date: 02/01/1988    Years since quitting: 33.5   Smokeless tobacco: Never  Substance and  Sexual Activity   Alcohol use: No   Drug use: No   Sexual activity: Yes  Other Topics Concern   Not on file  Social History Narrative   Not on file   Social Determinants of Health   Financial Resource Strain: Not on file  Food Insecurity: Not on file  Transportation Needs: Not on file  Physical Activity: Not on file  Stress: Not on file  Social Connections: Not on file  Intimate Partner Violence: Not on file    Review of Systems: See HPI, otherwise negative ROS  Physical Exam: Vital signs in last 24 hours: Temp:  [97.8 F (36.6 C)] 97.8 F (36.6 C) (07/17 0630) Pulse Rate:  [77] 77 (07/17 0630) Resp:  [17] 17 (07/17 0630) BP: (111)/(86) 111/86 (07/17 0630) SpO2:  [100 %] 100 % (07/17 0630)   General:    Alert,  Well-developed, well-nourished, pleasant and cooperative in NAD Head:  Normocephalic and atraumatic. Eyes:  Sclera clear, no icterus.   Conjunctiva pink. Ears:  Normal auditory acuity. Nose:  No deformity, discharge,  or lesions. Mouth:  No deformity or lesions, dentition normal. Neck:  Supple; no masses or thyromegaly. Lungs:  Clear throughout to auscultation.   No wheezes, crackles, or rhonchi. No acute distress. Heart:  Regular rate and rhythm; no murmurs, clicks, rubs,  or gallops. Abdomen:  Soft, nontender and nondistended. No masses, hepatosplenomegaly or hernias noted. Normal bowel sounds, without guarding, and without rebound.   Msk:  Symmetrical without gross deformities. Normal posture. Extremities:  Without clubbing or edema. Neurologic:  Alert and  oriented x4;  grossly normal neurologically. Skin:  Intact without significant lesions or rashes. Cervical Nodes:  No significant cervical adenopathy. Psych:  Alert and cooperative. Normal mood and affect.  Impression/Plan: Luis Hardin is here for a colonoscopy to be performed for colon cancer screening purposes.  The risks of the procedure including infection, bleed, or perforation as well as benefits, limitations, alternatives and imponderables have been reviewed with the patient. Questions have been answered. All parties agreeable.

## 2021-08-16 NOTE — Anesthesia Preprocedure Evaluation (Signed)
Anesthesia Evaluation  Patient identified by MRN, date of birth, ID band Patient awake    Reviewed: Allergy & Precautions, NPO status , Patient's Chart, lab work & pertinent test results  Airway Mallampati: II  TM Distance: >3 FB Neck ROM: Full    Dental  (+) Dental Advisory Given, Missing   Pulmonary former smoker,    Pulmonary exam normal breath sounds clear to auscultation       Cardiovascular hypertension, Pt. on medications Normal cardiovascular exam Rhythm:Regular Rate:Normal     Neuro/Psych negative neurological ROS  negative psych ROS   GI/Hepatic Neg liver ROS, hiatal hernia, GERD  Medicated,  Endo/Other  negative endocrine ROS  Renal/GU negative Renal ROS  negative genitourinary   Musculoskeletal negative musculoskeletal ROS (+)   Abdominal   Peds negative pediatric ROS (+)  Hematology negative hematology ROS (+)   Anesthesia Other Findings   Reproductive/Obstetrics negative OB ROS                           Anesthesia Physical Anesthesia Plan  ASA: 2  Anesthesia Plan: General   Post-op Pain Management: Minimal or no pain anticipated   Induction: Intravenous  PONV Risk Score and Plan: Propofol infusion  Airway Management Planned: Nasal Cannula and Natural Airway  Additional Equipment:   Intra-op Plan:   Post-operative Plan:   Informed Consent: I have reviewed the patients History and Physical, chart, labs and discussed the procedure including the risks, benefits and alternatives for the proposed anesthesia with the patient or authorized representative who has indicated his/her understanding and acceptance.     Dental advisory given  Plan Discussed with: CRNA and Surgeon  Anesthesia Plan Comments:        Anesthesia Quick Evaluation

## 2021-08-16 NOTE — Anesthesia Postprocedure Evaluation (Signed)
Anesthesia Post Note  Patient: Luis Hardin  Procedure(s) Performed: COLONOSCOPY WITH PROPOFOL POLYPECTOMY INTESTINAL  Patient location during evaluation: Phase II Anesthesia Type: General Level of consciousness: awake and alert and oriented Pain management: pain level controlled Vital Signs Assessment: post-procedure vital signs reviewed and stable Respiratory status: spontaneous breathing, nonlabored ventilation and respiratory function stable Cardiovascular status: blood pressure returned to baseline and stable Postop Assessment: no apparent nausea or vomiting Anesthetic complications: no   No notable events documented.   Last Vitals:  Vitals:   08/16/21 0807 08/16/21 0809  BP:  (!) 88/57  Pulse:  78  Resp:  15  Temp: (!) 36.1 C   SpO2:  97%    Last Pain:  Vitals:   08/16/21 0807  TempSrc: Oral  PainSc: 0-No pain                 Dejanique Ruehl C Jinna Weinman

## 2021-08-16 NOTE — Discharge Instructions (Addendum)
  Colonoscopy Discharge Instructions  Read the instructions outlined below and refer to this sheet in the next few weeks. These discharge instructions provide you with general information on caring for yourself after you leave the hospital. Your doctor may also give you specific instructions. While your treatment has been planned according to the most current medical practices available, unavoidable complications occasionally occur.   ACTIVITY You may resume your regular activity, but move at a slower pace for the next 24 hours.  Take frequent rest periods for the next 24 hours.  Walking will help get rid of the air and reduce the bloated feeling in your belly (abdomen).  No driving for 24 hours (because of the medicine (anesthesia) used during the test).   Do not sign any important legal documents or operate any machinery for 24 hours (because of the anesthesia used during the test).  NUTRITION Drink plenty of fluids.  You may resume your normal diet as instructed by your doctor.  Begin with a light meal and progress to your normal diet. Heavy or fried foods are harder to digest and may make you feel sick to your stomach (nauseated).  Avoid alcoholic beverages for 24 hours or as instructed.  MEDICATIONS You may resume your normal medications unless your doctor tells you otherwise.  WHAT YOU CAN EXPECT TODAY Some feelings of bloating in the abdomen.  Passage of more gas than usual.  Spotting of blood in your stool or on the toilet paper.  IF YOU HAD POLYPS REMOVED DURING THE COLONOSCOPY: No aspirin products for 7 days or as instructed.  No alcohol for 7 days or as instructed.  Eat a soft diet for the next 24 hours.  FINDING OUT THE RESULTS OF YOUR TEST Not all test results are available during your visit. If your test results are not back during the visit, make an appointment with your caregiver to find out the results. Do not assume everything is normal if you have not heard from your  caregiver or the medical facility. It is important for you to follow up on all of your test results.  SEEK IMMEDIATE MEDICAL ATTENTION IF: You have more than a spotting of blood in your stool.  Your belly is swollen (abdominal distention).  You are nauseated or vomiting.  You have a temperature over 101.  You have abdominal pain or discomfort that is severe or gets worse throughout the day.   Your colonoscopy revealed 2 polyp(s) which I removed successfully. Await pathology results, my office will contact you. I recommend repeating colonoscopy in 5 years for surveillance purposes.   You also have diverticulosis and internal hemorrhoids. I would recommend increasing fiber in your diet or adding OTC Benefiber/Metamucil. Be sure to drink at least 4 to 6 glasses of water daily. Follow-up with GI as needed.   I hope you have a great rest of your week!  Charles K. Carver, D.O. Gastroenterology and Hepatology [facility]  

## 2021-08-17 LAB — SURGICAL PATHOLOGY

## 2021-08-17 NOTE — Telephone Encounter (Signed)
Pt was already done 7/17. thanks

## 2021-08-19 ENCOUNTER — Encounter (HOSPITAL_COMMUNITY): Payer: Self-pay | Admitting: Internal Medicine

## 2022-01-10 NOTE — Progress Notes (Signed)
H&P  Chief Complaint: Elevated PSA History of Present Illness: 56 year old male sent by [facility] for elevated PSA management.  He had a recent PSA done-total 4.6, percent free 13.  Past Medical History:  Diagnosis Date   Dry cough    GERD (gastroesophageal reflux disease)    H/O hiatal hernia    Hyperlipidemia    Hypertension    Inguinal hernia    RIGHT   Tendonitis    KNEES    Past Surgical History:  Procedure Laterality Date   COLONOSCOPY N/A 01/04/2016   Procedure: COLONOSCOPY;  Surgeon: Danie Binder, MD;  [facility];  Service: Endoscopy;  Laterality: N/A;  1:00 PM   COLONOSCOPY WITH PROPOFOL N/A 08/16/2021   Procedure: COLONOSCOPY WITH PROPOFOL;  Surgeon: Eloise Harman, DO;  [facility];  Service: Endoscopy;  Laterality: N/A;  7:30am, asa 2 (needed a monday)   ESOPHAGOGASTRODUODENOSCOPY  11/2007   Dhiflet: ?short segment Barrett's endoscopically but biopsies did not confirm.  Bx "eosinophilic esophagitis, acute and chronic inflammation of distal esophagus and gastric cardia"   ESOPHAGOGASTRODUODENOSCOPY  03/2003   Shiflett: ?short segment Barrett's, biopsy showed chronic carditis.    HERNIA REPAIR     INGUINAL HERNIA REPAIR Right 02/12/2013   Procedure: RIGHT INGUINAL HERNIA REPAIR;  Surgeon: Harl Bowie, MD;  Location: [facility];  Service: General;  Laterality: Right;   INSERTION OF MESH N/A 02/12/2013   Procedure: INSERTION OF MESH;  Surgeon: Harl Bowie, MD;  Location: [facility];  Service: General;  Laterality: N/A;   POLYPECTOMY  08/16/2021   Procedure: POLYPECTOMY INTESTINAL;  Surgeon: Eloise Harman, DO;  [facility];  Service: Endoscopy;;   SHOULDER SURGERY     L SHOULDER   TUMOR REMOVED     LEFT SIDE OF NECK (BENIGN)    Home Medications:  Allergies as of 01/11/2022   No Known Allergies      Medication List        Accurate as of January 10, 2022  8:38 AM. If you have any questions, ask  your nurse or doctor.          lisinopril 40 MG tablet Commonly known as: ZESTRIL Take 40 mg by mouth daily with breakfast.   multivitamin with minerals Tabs tablet Take 1 tablet by mouth daily.   omega-3 acid ethyl esters 1 g capsule Commonly known as: LOVAZA Take 1 g by mouth daily.   omeprazole 20 MG capsule Commonly known as: PRILOSEC Take 20 mg by mouth daily.   triamcinolone ointment 0.1 % Commonly known as: KENALOG Apply 1 Application topically 2 (two) times daily.        Allergies: No Known Allergies  Family History  Problem Relation Age of Onset   Heart disease Mother    Cancer Father        lung   Cancer Brother        skin   Colon polyps Brother    Cancer Paternal Grandfather        mouth   Colon polyps Maternal Uncle     Social History:  reports that he quit smoking about 33 years ago. His smoking use included cigarettes. He smoked an average of .25 packs per day. He has never used smokeless tobacco. He reports that he does not drink alcohol and does not use drugs.  ROS: A complete review of systems was performed.  All systems are negative except for pertinent findings as noted.  Physical Exam:  Vital signs in last 24 hours: There were no vitals taken for this visit. Constitutional:  Alert and oriented, No acute distress Cardiovascular: Regular rate  Respiratory: Normal respiratory effort GI: Abdomen is soft, nontender, nondistended, no abdominal masses. No CVAT.  Genitourinary: Normal male phallus, testes are descended bilaterally and non-tender and without masses, scrotum is normal in appearance without lesions or masses, perineum is normal on inspection. Lymphatic: No lymphadenopathy Neurologic: Grossly intact, no focal deficits Psychiatric: Normal mood and affect  I have reviewed prior pt notes  I have reviewed notes from referring/previous physicians  I have reviewed urinalysis results  I have independently reviewed prior  imaging  I have reviewed prior PSA results  I have reviewed prior urine culture   Impression/Assessment:  ***  Plan:  ***

## 2022-01-11 ENCOUNTER — Ambulatory Visit: Payer: 59 | Admitting: Urology

## 2022-01-11 ENCOUNTER — Encounter: Payer: Self-pay | Admitting: Urology

## 2022-01-11 VITALS — BP 146/77 | HR 80

## 2022-01-11 DIAGNOSIS — R972 Elevated prostate specific antigen [PSA]: Secondary | ICD-10-CM

## 2022-01-11 DIAGNOSIS — N5201 Erectile dysfunction due to arterial insufficiency: Secondary | ICD-10-CM

## 2022-01-11 LAB — URINALYSIS, ROUTINE W REFLEX MICROSCOPIC
Bilirubin, UA: NEGATIVE
Glucose, UA: NEGATIVE
Ketones, UA: NEGATIVE
Leukocytes,UA: NEGATIVE
Nitrite, UA: NEGATIVE
Protein,UA: NEGATIVE
RBC, UA: NEGATIVE
Specific Gravity, UA: 1.01 (ref 1.005–1.030)
Urobilinogen, Ur: 0.2 mg/dL (ref 0.2–1.0)
pH, UA: 6.5 (ref 5.0–7.5)

## 2022-01-11 MED ORDER — LEVOFLOXACIN 750 MG PO TABS: 750.0000 mg | ORAL_TABLET | Freq: Every day | ORAL | 0 refills | Status: DC

## 2022-01-11 MED ORDER — LEVOFLOXACIN 750 MG PO TABS: 750.0000 mg | ORAL_TABLET | Freq: Once | ORAL | 0 refills | Status: AC

## 2022-01-11 NOTE — Patient Instructions (Signed)
   Appointment Time: 8:15 am Appointment Date: 01/18/22  Location: [facility] Radiology Department   Prostate Biopsy Instructions  Stop all aspirin or blood thinners (aspirin, plavix, coumadin, warfarin, motrin, ibuprofen, advil, aleve, naproxen, naprosyn) for 7 days prior to the procedure.  If you have any questions about stopping these medications, please contact your primary care physician or cardiologist.  Having a light meal prior to the procedure is recommended.  If you are diabetic or have low blood sugar please bring a small snack or glucose tablet.  A Fleets enema is needed to be purchased over the counter at a local pharmacy and used 2 hours before you scheduled appointment.  This can be purchased over the counter at any pharmacy.  Antibiotics will be administered in the clinic at the time of the procedure and 1 tablet has been sent to your pharmacy. Please take the antibiotic as prescribed.    Please bring someone with you to the procedure to drive you home if you are given a valium to take prior to your procedure.   If you have any questions or concerns, please feel free to call the office at [phone] or send a Mychart message.    Thank you, [facility]

## 2022-01-17 ENCOUNTER — Telehealth: Payer: Self-pay

## 2022-01-17 NOTE — Telephone Encounter (Signed)
Patient called in about taking dulcolax the night before prostate biopsy and taking an enema two hours before the prostate biopsy. Made patient aware that it is okay that he took the dulcolax the night before the procedure and taking an enema two hours prior. Patient voiced understanding

## 2022-01-18 ENCOUNTER — Ambulatory Visit (HOSPITAL_BASED_OUTPATIENT_CLINIC_OR_DEPARTMENT_OTHER): Payer: 59 | Admitting: Urology

## 2022-01-18 ENCOUNTER — Encounter (HOSPITAL_COMMUNITY): Payer: Self-pay

## 2022-01-18 ENCOUNTER — Ambulatory Visit (HOSPITAL_COMMUNITY)
Admission: RE | Admit: 2022-01-18 | Discharge: 2022-01-18 | Disposition: A | Discharge status: Home / Self Care | Payer: 59 | Source: Ambulatory Visit | Attending: Urology | Admitting: Urology

## 2022-01-18 ENCOUNTER — Other Ambulatory Visit: Payer: Self-pay | Admitting: Urology

## 2022-01-18 ENCOUNTER — Telehealth: Payer: Self-pay

## 2022-01-18 DIAGNOSIS — C61 Malignant neoplasm of prostate: Secondary | ICD-10-CM | POA: Insufficient documentation

## 2022-01-18 DIAGNOSIS — R972 Elevated prostate specific antigen [PSA]: Secondary | ICD-10-CM

## 2022-01-18 MED ORDER — GENTAMICIN SULFATE 40 MG/ML IJ SOLN: 160.0000 mg | Freq: Once | INTRAMUSCULAR | Status: AC

## 2022-01-18 MED ORDER — LIDOCAINE HCL (PF) 2 % IJ SOLN
INTRAMUSCULAR | Status: AC
  Filled 2022-01-18: qty 10

## 2022-01-18 MED ORDER — GENTAMICIN SULFATE 40 MG/ML IJ SOLN
INTRAMUSCULAR | Status: AC
  Administered 2022-01-18: 160 mg via INTRAMUSCULAR
  Filled 2022-01-18: qty 4

## 2022-01-18 NOTE — Telephone Encounter (Signed)
Wife called the office today stating after prostate biopsy today, patient started with vomiting.  Reviewed with Dr. Diona Fanti who recommended patient to go to ER for evaluation. Wife informed and reports vomiting has stopped and will take patient to ER if it returns.

## 2022-01-18 NOTE — Progress Notes (Signed)
Mr. Luis Hardin presents for ultrasound and biopsy of his prostate.  He has an elevated PSA.  Risks, benefits, and some of the potential complications of a transrectal ultrasounds of the prostate (TRUSP) with biopsies were discussed at length with the patient including gross hematuria, blood in the bowel movements, hematospermia, bacteremia, infection, voiding discomfort, urinary retention, fever, chills, sepsis, blood transfusion, death, and others. All questions were answered. Informed consent was obtained. The patient confirmed that he had taken his pre-procedure antibiotic. All anticoagulants were discontinued prior to the procedure. The patient emptied his bladder. He was positioned in a comfortable left lateral decubitus position with hips and knees acutely flexed.  The rectal probe was inserted into the rectum without difficulty. 10cc of 2% Lidocaine without epinephrine was instilled with a spinal needle using ultrasound guidance near the junction of each seminal vesicle and the prostate.  Sequential transverse (axial) scans were made in small increments beginning at the seminal vesicles and ending at the prostatic apex. Sequential longitudinal (saggital) scans were made in small increments beginning at the right lateral prostate and ending at the left lateral prostate. Excellent anatomical imaging was obtained. The peripheral, transitional, and central zones were well-defined. The seminal vesicles were normal.  Prostate volume 55 ml.  There were no hypoechoic areas. 12 needle core biopsies were performed. 1 biopsy each was taken from the following areas:  Right lateral base, right medial base, right lateral mid prostate, right medial mid prostate, right lateral apical prostate, right medial apical prostate, left lateral base, left medial base, left lateral mid prostate, left medial mid prostate, left lateral apical prostate, left medial apical prostate.. Minimal prostatic calcifications were noted.  Excellent biopsy specimens were obtained.  Follow-up rectal examination was unremarkable. The procedure was well-tolerated and without complications. Antibiotic instructions were given. The patient was told that:  For several days:  he should increase his fluid intake and limit strenuous activity  he might have mild discomfort at the base of his penis or in his rectum  he might have blood in his urine or blood in his bowel movements  For 2-3 months:  he might have blood in his ejaculate (semen)  Instructions were given to call the office immedicately for blood clots in the urine or bowel movements, difficulty urinating, inability to urinate, urinary retention, painful or frequent urination, fever, chills, nausea, vomiting, or other illness. The patient stated that he understood these instructions and would comply with them. We told the patient that prostate biopsy pathology reports are usually available within 3-5 working days, unless a pathologic second opinion is required, which may take 7-14 days. We told him to contact us to check on the status of his biopsy if he has not heard from [place] within 7 days. The patient left the ultrasound examination room in stable condition.

## 2022-01-21 ENCOUNTER — Telehealth: Payer: Self-pay

## 2022-01-21 NOTE — Telephone Encounter (Signed)
Patient gave verbal consent to give information to speak with his wife Tonie Vizcarrondo. 2 Patient identified were verified with patient. Made patient aware that when comes back to office he will need to sign a DPR for our office to be able to discuss medical information. Patient voiced understanding

## 2022-01-21 NOTE — Telephone Encounter (Signed)
Made wife aware that MD will discuss results at office visit. MD call patient. Wife voiced understanding

## 2022-01-21 NOTE — Telephone Encounter (Signed)
-----   Message from Franchot Gallo, MD sent at 01/21/2022 12:45 PM EST ----- I called w/ results Please call home # to set up OV to discuss ----- Message ----- From: Sherrilyn Rist, CMA Sent: 01/19/2022   4:56 PM EST To: Franchot Gallo, MD  Please review

## 2022-01-21 NOTE — Telephone Encounter (Signed)
Wife call today for prostate biopsy results and made her aware that there not a DPR in patient chart.Wife voiced understanding

## 2022-02-21 NOTE — Progress Notes (Signed)
History of Present Illness:   HEre for followup of elevated PSA.  12.19.2023: TRUS/Bx. Prostate volume 55 mL, PSA  4.6, PSAD 0.08. 4/12 cores revealed PCa--all 4 with GS 3+3 pattern. Positive biopsies were: Left base lateral, 30% of core involved Right mid medial, 20% of core involvement Right apex lateral, 5% of core involved Right apex medial, 70% of core involved  Past Medical History:  Diagnosis Date   Dry cough    GERD (gastroesophageal reflux disease)    H/O hiatal hernia    Hyperlipidemia    Hypertension    Inguinal hernia    RIGHT   Tendonitis    KNEES    Past Surgical History:  Procedure Laterality Date   COLONOSCOPY N/A 01/04/2016   Procedure: COLONOSCOPY;  Surgeon: Danie Binder, MD;  [facility];  Service: Endoscopy;  Laterality: N/A;  1:00 PM   COLONOSCOPY WITH PROPOFOL N/A 08/16/2021   Procedure: COLONOSCOPY WITH PROPOFOL;  Surgeon: Eloise Harman, DO;  Location: [facility];  Service: Endoscopy;  Laterality: N/A;  7:30am, asa 2 (needed a monday)   ESOPHAGOGASTRODUODENOSCOPY  11/2007   [place]: ?short segment Barrett's endoscopically but biopsies did not confirm.  Bx "eosinophilic esophagitis, acute and chronic inflammation of distal esophagus and gastric cardia"   ESOPHAGOGASTRODUODENOSCOPY  03/2003   [place]: ?short segment Barrett's, biopsy showed chronic carditis.    HERNIA REPAIR     INGUINAL HERNIA REPAIR Right 02/12/2013   Procedure: RIGHT INGUINAL HERNIA REPAIR;  Surgeon: Harl Bowie, MD;  Location: [facility];  Service: General;  Laterality: Right;   INSERTION OF MESH N/A 02/12/2013   Procedure: INSERTION OF MESH;  Surgeon: Harl Bowie, MD;  Location: [facility];  Service: General;  Laterality: N/A;   POLYPECTOMY  08/16/2021   Procedure: POLYPECTOMY INTESTINAL;  Surgeon: Eloise Harman, DO;  Location: [facility];  Service: Endoscopy;;   SHOULDER SURGERY     L SHOULDER   TUMOR REMOVED     LEFT SIDE OF NECK (BENIGN)     Home Medications:  Allergies as of 02/22/2022   No Known Allergies      Medication List        Accurate as of February 21, 2022 12:11 PM. If you have any questions, ask your nurse or doctor.          lisinopril 40 MG tablet Commonly known as: ZESTRIL Take 40 mg by mouth daily with breakfast.   multivitamin with minerals Tabs tablet Take 1 tablet by mouth daily.   omega-3 acid ethyl esters 1 g capsule Commonly known as: LOVAZA Take 1 g by mouth daily.   omeprazole 20 MG capsule Commonly known as: PRILOSEC Take 20 mg by mouth daily.   triamcinolone ointment 0.1 % Commonly known as: KENALOG Apply 1 Application topically 2 (two) times daily.        Allergies: No Known Allergies  Family History  Problem Relation Age of Onset   Heart disease Mother    Cancer Father        lung   Cancer Brother        skin   Colon polyps Brother    Cancer Paternal Grandfather        mouth   Colon polyps Maternal Uncle     Social History:  reports that he quit smoking about 34 years ago. His smoking use included cigarettes. He smoked an average of .25 packs per day. He has never used smokeless tobacco. He reports that  he does not drink alcohol and does not use drugs.  ROS: A complete review of systems was performed.  All systems are negative except for pertinent findings as noted.  Physical Exam:  Vital signs in last 24 hours: There were no vitals taken for this visit. 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 It was explained to him how his age and life expectancy, clinical stage, Gleason score, and PSA affect his prognosis, the decision to proceed with additional staging studies, as well as how that information influences recommended treatment strategies. We discussed the roles for active surveillance, radiation therapy, surgical therapy, androgen deprivation, as well as ablative therapy options for the treatment of prostate cancer as appropriate to his individual cancer situation. We discussed the risks and benefits of these options with regard to their impact on cancer control and also in terms of potential adverse events, complications, and impact on quality of life particularly related to urinary, bowel, and sexual function. The patient was encouraged to ask questions throughout the discussion today and all questions were answered to his stated satisfaction. In addition, the patient was provided with and/or directed to appropriate resources and literature for further education about prostate cancer and treatment options.  With his low risk prostate cancer, I have strongly recommended active surveillance.  We discussed about how this would be done-PSA, basic metabolic panel, MRI in 4 months with expected repeat biopsy in 8 months unless the MRI shows something significant.  All questions were answered.  They are comfortable with the above-mentioned strategy.

## 2022-02-22 ENCOUNTER — Encounter: Payer: Self-pay | Admitting: Urology

## 2022-02-22 ENCOUNTER — Ambulatory Visit (INDEPENDENT_AMBULATORY_CARE_PROVIDER_SITE_OTHER): Payer: 59 | Admitting: Urology

## 2022-02-22 VITALS — BP 120/74 | HR 80

## 2022-02-22 DIAGNOSIS — C61 Malignant neoplasm of prostate: Secondary | ICD-10-CM | POA: Diagnosis not present

## 2022-02-22 LAB — URINALYSIS, ROUTINE W REFLEX MICROSCOPIC
Bilirubin, UA: NEGATIVE
Glucose, UA: NEGATIVE
Ketones, UA: NEGATIVE
Leukocytes,UA: NEGATIVE
Nitrite, UA: NEGATIVE
Protein,UA: NEGATIVE
RBC, UA: NEGATIVE
Specific Gravity, UA: 1.015 (ref 1.005–1.030)
Urobilinogen, Ur: 0.2 mg/dL (ref 0.2–1.0)
pH, UA: 6.5 (ref 5.0–7.5)

## 2022-03-10 ENCOUNTER — Telehealth: Payer: Self-pay

## 2022-03-10 NOTE — Telephone Encounter (Signed)
Patient called in today about doing treatment/ removing the prostate instead of doing the surveillance. Patient wants to know if he need to get an MRI done before getting treatment if he decision not to the surveillance. Made patient aware that I will send a message to the MD. Patient voiced understanding

## 2022-03-14 NOTE — Telephone Encounter (Signed)
Patient/Wife called in today to find out what will be the best course of action regarding doing treatment/ removing the prostate instead of doing the surveillance. Patient wants to know if he need to get an MRI done before getting treatment if he decision not to the surveillance. Made patient aware that I will send a message to the MD. Patient voiced understandin

## 2022-03-15 NOTE — Telephone Encounter (Signed)
Tried calling patient with no answer, left VM for return call

## 2022-03-16 NOTE — Telephone Encounter (Signed)
Wife/Patient is aware of Dr. Diona Fanti recommendation of having surgery to remove the prostate rather than radiation. Wife/patient will call back after making a decision. Wife/Patient voiced understanding

## 2022-03-17 ENCOUNTER — Telehealth: Payer: Self-pay

## 2022-03-17 NOTE — Telephone Encounter (Signed)
Patient returning a call to [place]. Wanting to move forward with surgery. Will patient still need the MRI ordered.  Please advise.  Call back:  [phone]

## 2022-03-18 NOTE — Telephone Encounter (Signed)
Wife/Patient is aware that patient do not need an MRI and the MRI appt can be cancel because patient is proceeding with the robotic prostatectomy surgery. Wife/Patient is aware that a task will be sent to Dr. Diona Fanti making him aware that the patient would to proceed with surgery and someone will reach out to him regarding surgery consult. Wife/ patient voiced understanding

## 2022-03-24 ENCOUNTER — Other Ambulatory Visit: Payer: Self-pay | Admitting: Urology

## 2022-03-24 DIAGNOSIS — C61 Malignant neoplasm of prostate: Secondary | ICD-10-CM

## 2022-03-24 NOTE — Telephone Encounter (Signed)
Patient/wife is aware referral to Dr. Alinda Money is in and patient/wife voiced understanding

## 2022-04-01 ENCOUNTER — Other Ambulatory Visit: Payer: Self-pay | Admitting: Urology

## 2022-04-01 HISTORY — PX: PROSTATECTOMY: SHX69

## 2022-04-07 NOTE — Patient Instructions (Signed)
DUE TO COVID-19 ONLY TWO VISITORS  (aged 57 and older)  ARE ALLOWED TO COME WITH YOU AND STAY IN THE WAITING ROOM ONLY DURING PRE OP AND PROCEDURE.   **NO VISITORS ARE ALLOWED IN THE SHORT STAY AREA OR RECOVERY ROOM!!**  IF YOU WILL BE ADMITTED INTO THE HOSPITAL YOU ARE ALLOWED ONLY FOUR SUPPORT PEOPLE DURING VISITATION HOURS ONLY (7 AM -8PM)   The support person(s) must pass our screening, gel in and out, and wear a mask at all times, including in the patient's room. Patients must also wear a mask when staff or their support person are in the room. Visitors GUEST BADGE MUST BE WORN VISIBLY  One adult visitor may remain with you overnight and MUST be in the room by 8 P.M.     Your procedure is scheduled on: 04/21/22   Report to [facility] Main Entrance    Report to admitting at 5:15 AM   Call this number if you have problems the morning of surgery [phone]   Do not eat food :After Midnight. Starting on 04/20/22   After Midnight you may have the following liquids until  midnight on DAY OF SURGERY  Water Black Coffee (sugar ok, NO MILK/CREAM OR CREAMERS)  Tea (sugar ok, NO MILK/CREAM OR CREAMERS) regular and decaf                             Plain Jell-O (NO RED)                                           Fruit ices (not with fruit pulp, NO RED)                                     Popsicles (NO RED)                                                                  Juice: apple, WHITE grape, WHITE cranberry Sports drinks like Gatorade (NO RED)                      If you have questions, please contact your surgeon's office.   FOLLOW BOWEL PREP AND ANY ADDITIONAL PRE OP INSTRUCTIONS YOU RECEIVED FROM YOUR SURGEON'S OFFICE!!!            Magnesium citrate and fleets enema   Oral Hygiene is also important to reduce your risk of infection.                                    Remember - BRUSH YOUR TEETH THE MORNING OF SURGERY WITH YOUR REGULAR TOOTHPASTE  DENTURES WILL BE  REMOVED PRIOR TO SURGERY PLEASE DO NOT APPLY "Poly grip" OR ADHESIVES!!!   Do NOT smoke after Midnight   Take these medicines the morning of surgery with A SIP OF WATER:  Omeprazole  Bring CPAP mask and tubing day of surgery.  You may not have any metal on your body including jewelry, and body piercing             Do not wear lotions, powders, cologne, or deodorant               Men may shave face and neck.   Do not bring valuables to the hospital. [place].   Contacts, glasses, or bridgework may not be worn into surgery.   Bring small overnight bag day of surgery.   DO NOT [place]. PHARMACY WILL DISPENSE MEDICATIONS LISTED ON YOUR MEDICATION LIST TO YOU DURING YOUR ADMISSION [employer]!       Special Instructions: Bring a copy of your healthcare power of attorney and living will documents the day of surgery if you haven't scanned them before.              Please read over the following fact sheets you were given: IF YOU HAVE QUESTIONS ABOUT YOUR PRE-OP INSTRUCTIONS PLEASE CALL [phone]    [facility] - Preparing for Surgery Before surgery, you can play an important role.  Because skin is not sterile, your skin needs to be as free of germs as possible.  You can reduce the number of germs on your skin by washing with CHG (chlorahexidine gluconate) soap before surgery.  CHG is an antiseptic cleaner which kills germs and bonds with the skin to continue killing germs even after washing. Please DO NOT use if you have an allergy to CHG or antibacterial soaps.  If your skin becomes reddened/irritated stop using the CHG and inform your nurse when you arrive at Short Stay. You may shave your face/neck.  Please follow these instructions carefully:  1.  Shower with CHG Soap the night before surgery and the  morning of Surgery.  2.  If you choose to wash your hair, wash  your hair first as usual with your  normal  shampoo.  3.  After you shampoo, rinse your hair and body thoroughly to remove the  shampoo.                            4.  Use CHG as you would any other liquid soap.  You can apply chg directly  to the skin and wash                       Gently with a scrungie or clean washcloth.  5.  Apply the CHG Soap to your body ONLY FROM THE NECK DOWN.   Do not use on face/ open                           Wound or open sores. Avoid contact with eyes, ears mouth and genitals (private parts).                       Wash face,  Genitals (private parts) with your normal soap.             6.  Wash thoroughly, paying special attention to the area where your surgery  will be performed.  7.  Thoroughly rinse your body with warm water from the neck down.  8.  DO NOT shower/wash with your  normal soap after using and rinsing off  the CHG Soap.             9.  Pat yourself dry with a clean towel.            10.  Wear clean pajamas.            11.  Place clean sheets on your bed the night of your first shower and do not  sleep with pets. Day of Surgery : Do not apply any lotions/deodorants the morning of surgery.  Please wear clean clothes to the hospital/surgery center.  FAILURE TO FOLLOW THESE INSTRUCTIONS MAY RESULT IN THE CANCELLATION OF YOUR SURGERY   PATIENT SIGNATURE_________________________________  ________________________________________________________________________  Luis Hardin  An incentive spirometer is a tool that can help keep your lungs clear and active. This tool measures how well you are filling your lungs with each breath. Taking long deep breaths may help reverse or decrease the chance of developing breathing (pulmonary) problems (especially infection) following: A long period of time when you are unable to move or be active. BEFORE THE PROCEDURE  If the spirometer includes an indicator to show your best effort, your nurse or respiratory  therapist will set it to a desired goal. If possible, sit up straight or lean slightly forward. Try not to slouch. Hold the incentive spirometer in an upright position. INSTRUCTIONS FOR USE  Sit on the edge of your bed if possible, or sit up as far as you can in bed or on a chair. Hold the incentive spirometer in an upright position. Breathe out normally. Place the mouthpiece in your mouth and seal your lips tightly around it. Breathe in slowly and as deeply as possible, raising the piston or the ball toward the top of the column. Hold your breath for 3-5 seconds or for as long as possible. Allow the piston or ball to fall to the bottom of the column. Remove the mouthpiece from your mouth and breathe out normally. Rest for a few seconds and repeat Steps 1 through 7 at least 10 times every 1-2 hours when you are awake. Take your time and take a few normal breaths between deep breaths. The spirometer may include an indicator to show your best effort. Use the indicator as a goal to work toward during each repetition. After each set of 10 deep breaths, practice coughing to be sure your lungs are clear. If you have an incision (the cut made at the time of surgery), support your incision when coughing by placing a pillow or rolled up towels firmly against it. Once you are able to get out of bed, walk around indoors and cough well. You may stop using the incentive spirometer when instructed by your caregiver.  RISKS AND COMPLICATIONS Take your time so you do not get dizzy or light-headed. If you are in pain, you may need to take or ask for pain medication before doing incentive spirometry. It is harder to take a deep breath if you are having pain. AFTER USE Rest and breathe slowly and easily. It can be helpful to keep track of a log of your progress. Your caregiver can provide you with a simple table to help with this. If you are using the spirometer at home, follow these instructions: [place] IF:  You are having difficultly using the spirometer. You have trouble using the spirometer as often as instructed. Your pain medication is not giving enough relief while using the spirometer. You develop  fever of 100.5 F (38.1 C) or higher. SEEK IMMEDIATE MEDICAL CARE IF:  You cough up bloody sputum that had not been present before. You develop fever of 102 F (38.9 C) or greater. You develop worsening pain at or near the incision site. MAKE SURE YOU:  Understand these instructions. Will watch your condition. Will get help right away if you are not doing well or get worse. Document Released: 05/30/2006 Document Revised: 04/11/2011 Document Reviewed: 07/31/2006 [facility] Patient Information 2014 [place], [place].   ________________________________________________________________________

## 2022-04-12 ENCOUNTER — Encounter (HOSPITAL_COMMUNITY)
Admission: RE | Admit: 2022-04-12 | Discharge: 2022-04-12 | Disposition: A | Discharge status: Home / Self Care | Payer: Managed Care, Other (non HMO) | Source: Ambulatory Visit | Attending: Urology | Admitting: Urology

## 2022-04-12 ENCOUNTER — Other Ambulatory Visit: Payer: Self-pay

## 2022-04-12 ENCOUNTER — Encounter (HOSPITAL_COMMUNITY): Payer: Self-pay

## 2022-04-12 DIAGNOSIS — I10 Essential (primary) hypertension: Secondary | ICD-10-CM | POA: Insufficient documentation

## 2022-04-12 DIAGNOSIS — I4510 Unspecified right bundle-branch block: Secondary | ICD-10-CM | POA: Insufficient documentation

## 2022-04-12 DIAGNOSIS — Z01818 Encounter for other preprocedural examination: Secondary | ICD-10-CM | POA: Diagnosis present

## 2022-04-12 LAB — CBC
HCT: 41.6 % (ref 39.0–52.0)
Hemoglobin: 14.3 g/dL (ref 13.0–17.0)
MCH: 30.7 pg (ref 26.0–34.0)
MCHC: 34.4 g/dL (ref 30.0–36.0)
MCV: 89.3 fL (ref 80.0–100.0)
Platelets: 287 10*3/uL (ref 150–400)
RBC: 4.66 MIL/uL (ref 4.22–5.81)
RDW: 12.5 % (ref 11.5–15.5)
WBC: 8.1 10*3/uL (ref 4.0–10.5)
nRBC: 0 % (ref 0.0–0.2)

## 2022-04-12 LAB — BASIC METABOLIC PANEL
Anion gap: 9 (ref 5–15)
BUN: 8 mg/dL (ref 6–20)
CO2: 22 mmol/L (ref 22–32)
Calcium: 8.9 mg/dL (ref 8.9–10.3)
Chloride: 104 mmol/L (ref 98–111)
Creatinine, Ser: 0.9 mg/dL (ref 0.61–1.24)
GFR, Estimated: >60 mL/min (ref >60)
Glucose, Bld: 99 mg/dL (ref 70–99)
Potassium: 4.2 mmol/L (ref 3.5–5.1)
Sodium: 135 mmol/L (ref 135–145)

## 2022-04-12 NOTE — Progress Notes (Signed)
Anesthesia note:  Bowel prep reminder:  Yes. Mag citrate and fleets  PCP - Dr. Woody Seller Cardiologist -none Other-   Chest x-ray - no EKG - 04/12/22 Stress Test - no ECHO - no Cardiac Cath - no CABG-no Pacemaker/ICD device last checked:NA  Sleep Study - no CPAP -   Pt is pre diabetic-no CBG at PAT visit- Fasting Blood Sugar at home- Checks Blood Sugar _____  Blood Thinner:no Blood Thinner Instructions: Aspirin Instructions: Last Dose:  Anesthesia review: /No    Patient denies shortness of breath, fever, cough and chest pain at PAT appointment. Pt has no SOB with any activities   Patient verbalized understanding of instructions that were given to them at the PAT appointment. Patient was also instructed that they will need to review over the PAT instructions again at home before surgery.yes his wife was with him.

## 2022-04-15 ENCOUNTER — Other Ambulatory Visit (HOSPITAL_COMMUNITY): Payer: 59

## 2022-04-20 NOTE — H&P (Signed)
Office Visit Report     03/29/2022   --------------------------------------------------------------------------------   Luis Hardin  MRN: [id number]  DOB: Mar 25, 1965, 57 year old Male  SSN: [id number]   PRIMARY CARE:     REFERRING:  Lillette Boxer. Dahlstedt, MD  PROVIDER:  Raynelle Bring, M.D.  LOCATION:  [facility] [phone]     --------------------------------------------------------------------------------   CC/HPI: CC: Prostate Cancer   Physician requesting consult: Dr. Franchot Gallo  PCP: Dr. Abran Richard   Luis Hardin is a 57 year old gentleman who was found to have an elevated PSA of 4.6 prompting a TRUS biopsy of the prostate by Dr. Diona Fanti on 01/18/22. This confirmed Gleason 3+3=6 adenocarcinoma of the prostate in 4 out of 12 biopsy cores. He discussed option with Dr. Diona Fanti and was recommended to proceed with initial active surveillance.   Family history: None.   Imaging studies: None.   PMH: He has a history of GERD, hyperlipidemia, and hypertension.  PSH: No abdominal surgeries.   TNM stage: cT1c Nx Mx  PSA: 4.6  Gleason score: 3+3=6 (GG 1)  Biopsy (01/18/22): 4/12 cores positive  Left: L lateral base (30%, 3+3=6)  Right: R apex (70%, 3+3=6), R lateral apex (5%, 3+3=6), R mid (20%, 3+3=6)  Prostate volume: 55.0 cc  PSAD: 0.08   Nomogram  OC disease: 84%  EPE: 16%  SVI: 1%  LNI: 1%  PFS (5 year, 10 year): 94%, 90%   Urinary function: IPSS is 11.  Erectile function: SHIM score is 18. He can typically get erections but has a difficult time maintaining his erection sometimes. He was recently given a prescription for sildenafil but has not yet tried this medication.     ALLERGIES: No Allergies    MEDICATIONS: Lisinopril 40 mg tablet Oral  Multivitamins tablet Oral  Omega-3 Fish Oil 300 mg (100 mg-160 mg-40 mg)-1,000 mg capsule Oral  Omeprazole 20 mg tablet, delayed release Oral     GU PSH: None     PSH Notes: Shoulder  Surgery, Neck Surgery   NON-GU PSH: Hernia Repair Shoulder Surgery (Unspecified)     GU PMH: Unil Inguinal Hernia W/O obst or gang,non-recurrent, Inguinal hernia, right - 2014    NON-GU PMH: GERD Hypertension    FAMILY HISTORY: Death of family member - Runs In Family Lung Cancer - Runs In Family Oral-mouth cancer - Runs In Family   SOCIAL HISTORY: Marital Status: Married Preferred Language: English; Ethnicity: Not Hispanic Or Latino; Race: White Current Smoking Status: Patient does not smoke anymore.   Tobacco Use Assessment Completed: Used Tobacco in last 30 days? Has never drank.  Drinks 2 caffeinated drinks per day.     Notes: Occupation, Caffeine use, Alcohol use, Married, Number of children, Former smoker   REVIEW OF SYSTEMS:    GU Review Male:   Patient reports get up at night to urinate and stream starts and stops. Patient denies have to strain to urinate , leakage of urine, trouble starting your streams, frequent urination, hard to postpone urination, and burning/ pain with urination.  Gastrointestinal (Lower):   Patient denies diarrhea and constipation.  Gastrointestinal (Upper):   Patient denies nausea and vomiting.  Constitutional:   Patient denies fever, night sweats, weight loss, and fatigue.  Skin:   Patient reports skin rash/ lesion and itching.   Eyes:   Patient denies blurred vision and double vision.  Ears/ Nose/ Throat:   Patient reports sinus problems. Patient denies sore throat.  Hematologic/Lymphatic:   Patient denies  swollen glands and easy bruising.  Cardiovascular:   Patient denies leg swelling and chest pains.  Respiratory:   Patient reports cough. Patient denies shortness of breath.  Endocrine:   Patient denies excessive thirst.  Musculoskeletal:   Patient reports back pain. Patient denies joint pain.  Neurological:   Patient denies headaches and dizziness.  Psychologic:   Patient denies depression and anxiety.   VITAL SIGNS:      03/29/2022  09:01 AM  Weight 215 lb / 97.52 kg  Height 69 in / 175.26 cm  BP 139/80 mmHg  Pulse 89 /min  Temperature 97.8 F / 36.5 C  BMI 31.7 kg/m   GU PHYSICAL EXAMINATION:    Prostate: Prostate about 50 grams. Left lobe normal consistency, right lobe normal consistency. Symmetrical lobes. No prostate nodule. Left lobe no tenderness, right lobe no tenderness.    MULTI-SYSTEM PHYSICAL EXAMINATION:    Constitutional: Well-nourished. No physical deformities. Normally developed. Good grooming.  Respiratory: No labored breathing, no use of accessory muscles. Clear bilaterally.  Cardiovascular: Normal temperature, normal extremity pulses, no swelling, no varicosities. Regular rate and rhythm.  Gastrointestinal: No mass, no tenderness, no rigidity, non obese abdomen.      Complexity of Data:  Lab Test Review:   PSA  Records Review:   Pathology Reports, Previous Patient Records   PROCEDURES: None   ASSESSMENT:      ICD-10 Details  1 GU:   Prostate Cancer - C61    PLAN:           Schedule Return Visit/Planned Activity: Other See Visit Notes             Note: Will call to schedule surgery.  Return Visit/Planned Activity: Next Available Appointment - PT Referral             Note: Please schedule patient for preoperative PT prior to radical prostatectomy.            Document Letter(s):  Created for Patient: Clinical Summary         Notes:   1. Low risk prostate cancer: I had a detailed discussion with Luis Hardin and his wife today. He has been thoroughly counseled by Dr. Diona Fanti prior to his appointment today. The patient was counseled about the natural history of prostate cancer and the standard treatment options that are available for prostate cancer. It was explained to him how his age and life expectancy, clinical stage, Gleason score/prognostic grade group, and PSA (and PSA density) affect his prognosis, the decision to proceed with additional staging studies, as well as how that  information influences recommended treatment strategies. We discussed the roles for active surveillance, radiation therapy, surgical therapy, androgen deprivation, as well as ablative therapy and other investigational options for the treatment of prostate cancer as appropriate to his individual cancer situation. We discussed the risks and benefits of these options with regard to their impact on cancer control and also in terms of potential adverse events, complications, and impact on quality of life particularly related to urinary and sexual function. The patient was encouraged to ask questions throughout the discussion today and all questions were answered to his stated satisfaction. In addition, the patient was provided with and/or directed to appropriate resources and literature for further education about prostate cancer and treatment options.   We did have a detailed discussion regarding active surveillance as a standard approach for low risk prostate cancer. However, Luis Hardin does not feel particularly comfortable with this option even after reviewing  the low risk for progression and the strong likelihood that he would still be cured with delayed intervention if necessary. He is most interested in proceeding with surgical intervention and understands the risks and accepts these risks including both urinary incontinence and worsening erectile function.   We discussed surgical therapy for prostate cancer including the different available surgical approaches. We discussed, in detail, the risks and expectations of surgery with regard to cancer control, urinary control, and erectile function as well as the expected postoperative recovery process. Additional risks of surgery including but not limited to bleeding, infection, hernia formation, nerve damage, lymphocele formation, bowel/rectal injury potentially necessitating colostomy, damage to the urinary tract resulting in urine leakage, urethral stricture, and  the cardiopulmonary risks such as myocardial infarction, stroke, death, venothromboembolism, etc. were explained. The risk of open surgical conversion for robotic/laparoscopic prostatectomy was also discussed.   He would like to proceed with surgical intervention and will be scheduled for a bilateral nerve sparing robot-assisted laparoscopic radical prostatectomy and bilateral pelvic lymphadenectomy.   CC: Dr. Franchot Gallo  Dr. Abran Richard        Next Appointment:      Next Appointment: 05/24/2022 09:00 AM    Appointment Type: 60 Physical Therapy    Location: [facility] [phone]    Provider: Nyra Capes    Reason for Visit: Next Available Appointment - PT Referral, PT prior to radical prostatectomy      E & M CODES: We spent 62 minutes dedicated to evaluation and management time, including face to face interaction, discussions on coordination of care, documentation, result review, and discussion with others as applicable.     * Signed by Raynelle Bring, M.D. on 03/30/22 at 4:21 PM (EST)*

## 2022-04-21 ENCOUNTER — Other Ambulatory Visit: Payer: Self-pay

## 2022-04-21 ENCOUNTER — Encounter (HOSPITAL_COMMUNITY)
Admission: RE | Disposition: A | Discharge status: Home / Self Care | Payer: Self-pay | Source: Ambulatory Visit | Attending: Urology

## 2022-04-21 ENCOUNTER — Observation Stay (HOSPITAL_COMMUNITY)
Admission: RE | Admit: 2022-04-21 | Discharge: 2022-04-22 | Disposition: A | Discharge status: Home / Self Care | Payer: Managed Care, Other (non HMO) | Source: Ambulatory Visit | Attending: Urology | Admitting: Urology

## 2022-04-21 ENCOUNTER — Ambulatory Visit (HOSPITAL_COMMUNITY): Payer: Managed Care, Other (non HMO) | Admitting: Physician Assistant

## 2022-04-21 ENCOUNTER — Encounter (HOSPITAL_COMMUNITY): Payer: Self-pay | Admitting: Urology

## 2022-04-21 ENCOUNTER — Ambulatory Visit (HOSPITAL_BASED_OUTPATIENT_CLINIC_OR_DEPARTMENT_OTHER): Payer: Managed Care, Other (non HMO) | Admitting: Certified Registered"

## 2022-04-21 DIAGNOSIS — I10 Essential (primary) hypertension: Secondary | ICD-10-CM | POA: Diagnosis not present

## 2022-04-21 DIAGNOSIS — Z79899 Other long term (current) drug therapy: Secondary | ICD-10-CM | POA: Diagnosis not present

## 2022-04-21 DIAGNOSIS — C61 Malignant neoplasm of prostate: Principal | ICD-10-CM | POA: Diagnosis present

## 2022-04-21 HISTORY — PX: ROBOT ASSISTED LAPAROSCOPIC RADICAL PROSTATECTOMY: SHX5141

## 2022-04-21 LAB — HEMOGLOBIN AND HEMATOCRIT, BLOOD
HCT: 40.9 % (ref 39.0–52.0)
Hemoglobin: 13.8 g/dL (ref 13.0–17.0)

## 2022-04-21 LAB — TYPE AND SCREEN
ABO/RH(D): A POS
Antibody Screen: NEGATIVE

## 2022-04-21 LAB — ABO/RH: ABO/RH(D): A POS

## 2022-04-21 SURGERY — XI ROBOTIC ASSISTED LAPAROSCOPIC RADICAL PROSTATECTOMY LEVEL 1
Anesthesia: General | Site: Abdomen

## 2022-04-21 MED ORDER — DEXAMETHASONE SODIUM PHOSPHATE 10 MG/ML IJ SOLN
INTRAMUSCULAR | Status: DC | PRN
  Administered 2022-04-21: 10 mg via INTRAVENOUS

## 2022-04-21 MED ORDER — ONDANSETRON HCL 4 MG/2ML IJ SOLN
INTRAMUSCULAR | Status: DC | PRN
  Administered 2022-04-21: 4 mg via INTRAVENOUS

## 2022-04-21 MED ORDER — STERILE WATER FOR IRRIGATION IR SOLN
Status: DC | PRN
  Administered 2022-04-21: 1000 mL

## 2022-04-21 MED ORDER — DIPHENHYDRAMINE HCL 12.5 MG/5ML PO ELIX: 12.5000 mg | ORAL_SOLUTION | Freq: Four times a day (QID) | ORAL | Status: DC | PRN

## 2022-04-21 MED ORDER — LIDOCAINE 2% (20 MG/ML) 5 ML SYRINGE
INTRAMUSCULAR | Status: DC | PRN
  Administered 2022-04-21: 100 mg via INTRAVENOUS

## 2022-04-21 MED ORDER — ONDANSETRON HCL 4 MG/2ML IJ SOLN: 4.0000 mg | INTRAMUSCULAR | Status: DC | PRN

## 2022-04-21 MED ORDER — FLEET ENEMA 7-19 GM/118ML RE ENEM: 1.0000 | ENEMA | Freq: Once | RECTAL | Status: DC

## 2022-04-21 MED ORDER — ACETAMINOPHEN 325 MG PO TABS: 650.0000 mg | ORAL_TABLET | ORAL | Status: DC | PRN

## 2022-04-21 MED ORDER — CEFAZOLIN SODIUM-DEXTROSE 1-4 GM/50ML-% IV SOLN
1.0000 g | Freq: Three times a day (TID) | INTRAVENOUS | Status: AC
  Administered 2022-04-21 (×2): 1 g via INTRAVENOUS
  Filled 2022-04-21 (×2): qty 50

## 2022-04-21 MED ORDER — KCL IN DEXTROSE-NACL 20-5-0.45 MEQ/L-%-% IV SOLN
INTRAVENOUS | Status: DC
  Filled 2022-04-21 (×4): qty 1000

## 2022-04-21 MED ORDER — FENTANYL CITRATE (PF) 100 MCG/2ML IJ SOLN
INTRAMUSCULAR | Status: AC
  Filled 2022-04-21: qty 2

## 2022-04-21 MED ORDER — TRIPLE ANTIBIOTIC 3.5-400-5000 EX OINT: 1.0000 | TOPICAL_OINTMENT | Freq: Three times a day (TID) | CUTANEOUS | Status: DC | PRN

## 2022-04-21 MED ORDER — OXYCODONE HCL 5 MG/5ML PO SOLN: 5.0000 mg | Freq: Once | ORAL | Status: DC | PRN

## 2022-04-21 MED ORDER — MIDAZOLAM HCL 2 MG/2ML IJ SOLN
INTRAMUSCULAR | Status: AC
  Filled 2022-04-21: qty 2

## 2022-04-21 MED ORDER — MIDAZOLAM HCL 5 MG/5ML IJ SOLN
INTRAMUSCULAR | Status: DC | PRN
  Administered 2022-04-21: 2 mg via INTRAVENOUS

## 2022-04-21 MED ORDER — HYDROMORPHONE HCL 1 MG/ML IJ SOLN
INTRAMUSCULAR | Status: AC
  Administered 2022-04-21: 0.5 mg via INTRAVENOUS
  Filled 2022-04-21: qty 2

## 2022-04-21 MED ORDER — LACTATED RINGERS IV SOLN: INTRAVENOUS | Status: DC

## 2022-04-21 MED ORDER — ARTIFICIAL TEARS OPHTHALMIC OINT
TOPICAL_OINTMENT | OPHTHALMIC | Status: DC | PRN
  Filled 2022-04-21: qty 3.5

## 2022-04-21 MED ORDER — PHENYLEPHRINE 80 MCG/ML (10ML) SYRINGE FOR IV PUSH (FOR BLOOD PRESSURE SUPPORT)
PREFILLED_SYRINGE | INTRAVENOUS | Status: AC
  Filled 2022-04-21: qty 10

## 2022-04-21 MED ORDER — SULFAMETHOXAZOLE-TRIMETHOPRIM 800-160 MG PO TABS: 1.0000 | ORAL_TABLET | Freq: Two times a day (BID) | ORAL | 0 refills | Status: DC

## 2022-04-21 MED ORDER — SUGAMMADEX SODIUM 500 MG/5ML IV SOLN
INTRAVENOUS | Status: DC | PRN
  Administered 2022-04-21: 500 mg via INTRAVENOUS

## 2022-04-21 MED ORDER — LIDOCAINE HCL (PF) 2 % IJ SOLN
INTRAMUSCULAR | Status: AC
  Filled 2022-04-21: qty 5

## 2022-04-21 MED ORDER — LACTATED RINGERS IV SOLN
INTRAVENOUS | Status: DC | PRN
  Administered 2022-04-21: 1000 mL

## 2022-04-21 MED ORDER — DEXAMETHASONE SODIUM PHOSPHATE 10 MG/ML IJ SOLN
INTRAMUSCULAR | Status: AC
  Filled 2022-04-21: qty 1

## 2022-04-21 MED ORDER — CHLORHEXIDINE GLUCONATE 0.12 % MT SOLN
15.0000 mL | Freq: Once | OROMUCOSAL | Status: AC
  Administered 2022-04-21: 15 mL via OROMUCOSAL

## 2022-04-21 MED ORDER — MORPHINE SULFATE (PF) 2 MG/ML IV SOLN
2.0000 mg | INTRAVENOUS | Status: DC | PRN
  Administered 2022-04-21: 2 mg via INTRAVENOUS
  Filled 2022-04-21: qty 1

## 2022-04-21 MED ORDER — DOCUSATE SODIUM 100 MG PO CAPS
100.0000 mg | ORAL_CAPSULE | Freq: Two times a day (BID) | ORAL | Status: DC
  Administered 2022-04-21 – 2022-04-22 (×2): 100 mg via ORAL
  Filled 2022-04-21 (×2): qty 1

## 2022-04-21 MED ORDER — ROCURONIUM BROMIDE 10 MG/ML (PF) SYRINGE
PREFILLED_SYRINGE | INTRAVENOUS | Status: AC
  Filled 2022-04-21: qty 10

## 2022-04-21 MED ORDER — SODIUM CHLORIDE 0.9 % IR SOLN
Status: DC | PRN
  Administered 2022-04-21: 1000 mL via INTRAVESICAL

## 2022-04-21 MED ORDER — LISINOPRIL 20 MG PO TABS
40.0000 mg | ORAL_TABLET | Freq: Every day | ORAL | Status: DC
  Administered 2022-04-22: 40 mg via ORAL
  Filled 2022-04-21: qty 2

## 2022-04-21 MED ORDER — HEPARIN SODIUM (PORCINE) 5000 UNIT/ML IJ SOLN
INTRAMUSCULAR | Status: AC
  Filled 2022-04-21: qty 1

## 2022-04-21 MED ORDER — BUPIVACAINE-EPINEPHRINE (PF) 0.25% -1:200000 IJ SOLN
INTRAMUSCULAR | Status: AC
  Filled 2022-04-21: qty 30

## 2022-04-21 MED ORDER — SUGAMMADEX SODIUM 500 MG/5ML IV SOLN
INTRAVENOUS | Status: AC
  Filled 2022-04-21: qty 5

## 2022-04-21 MED ORDER — HEMOSTATIC AGENTS (NO CHARGE) OPTIME
TOPICAL | Status: DC | PRN
  Administered 2022-04-21: 1 via TOPICAL

## 2022-04-21 MED ORDER — ZOLPIDEM TARTRATE 5 MG PO TABS: 5.0000 mg | ORAL_TABLET | Freq: Every evening | ORAL | Status: DC | PRN

## 2022-04-21 MED ORDER — PHENYLEPHRINE HCL (PRESSORS) 10 MG/ML IV SOLN
INTRAVENOUS | Status: AC
  Filled 2022-04-21: qty 1

## 2022-04-21 MED ORDER — HYDROMORPHONE HCL 1 MG/ML IJ SOLN
0.2500 mg | INTRAMUSCULAR | Status: DC | PRN
  Administered 2022-04-21: 0.5 mg via INTRAVENOUS

## 2022-04-21 MED ORDER — PROPOFOL 10 MG/ML IV BOLUS
INTRAVENOUS | Status: DC | PRN
  Administered 2022-04-21: 200 mg via INTRAVENOUS

## 2022-04-21 MED ORDER — SODIUM CHLORIDE 0.9 % IV BOLUS
1000.0000 mL | Freq: Once | INTRAVENOUS | Status: AC
  Administered 2022-04-21: 1000 mL via INTRAVENOUS

## 2022-04-21 MED ORDER — ONDANSETRON HCL 4 MG/2ML IJ SOLN
INTRAMUSCULAR | Status: AC
  Filled 2022-04-21: qty 2

## 2022-04-21 MED ORDER — SUCCINYLCHOLINE CHLORIDE 200 MG/10ML IV SOSY
PREFILLED_SYRINGE | INTRAVENOUS | Status: AC
  Filled 2022-04-21: qty 10

## 2022-04-21 MED ORDER — DIPHENHYDRAMINE HCL 50 MG/ML IJ SOLN: 12.5000 mg | Freq: Four times a day (QID) | INTRAMUSCULAR | Status: DC | PRN

## 2022-04-21 MED ORDER — OXYCODONE HCL 5 MG PO TABS: 5.0000 mg | ORAL_TABLET | Freq: Once | ORAL | Status: DC | PRN

## 2022-04-21 MED ORDER — ONDANSETRON HCL 4 MG/2ML IJ SOLN: 4.0000 mg | Freq: Once | INTRAMUSCULAR | Status: DC | PRN

## 2022-04-21 MED ORDER — DOCUSATE SODIUM 100 MG PO CAPS: 100.0000 mg | ORAL_CAPSULE | Freq: Two times a day (BID) | ORAL | Status: DC

## 2022-04-21 MED ORDER — TRAMADOL HCL 50 MG PO TABS: 50.0000 mg | ORAL_TABLET | Freq: Four times a day (QID) | ORAL | 0 refills | Status: DC | PRN

## 2022-04-21 MED ORDER — HYOSCYAMINE SULFATE 0.125 MG SL SUBL: 0.1250 mg | SUBLINGUAL_TABLET | Freq: Four times a day (QID) | SUBLINGUAL | Status: DC | PRN

## 2022-04-21 MED ORDER — PHENYLEPHRINE 80 MCG/ML (10ML) SYRINGE FOR IV PUSH (FOR BLOOD PRESSURE SUPPORT)
PREFILLED_SYRINGE | INTRAVENOUS | Status: DC | PRN
  Administered 2022-04-21: 80 ug via INTRAVENOUS
  Administered 2022-04-21: 160 ug via INTRAVENOUS
  Administered 2022-04-21: 80 ug via INTRAVENOUS
  Administered 2022-04-21 (×2): 160 ug via INTRAVENOUS
  Administered 2022-04-21: 80 ug via INTRAVENOUS
  Administered 2022-04-21: 160 ug via INTRAVENOUS

## 2022-04-21 MED ORDER — MAGNESIUM CITRATE PO SOLN: 1.0000 | Freq: Once | ORAL | Status: DC

## 2022-04-21 MED ORDER — HEPARIN SODIUM (PORCINE) 1000 UNIT/ML IJ SOLN
INTRAMUSCULAR | Status: AC
  Filled 2022-04-21: qty 1

## 2022-04-21 MED ORDER — KETOROLAC TROMETHAMINE 15 MG/ML IJ SOLN
15.0000 mg | Freq: Four times a day (QID) | INTRAMUSCULAR | Status: DC
  Administered 2022-04-21 – 2022-04-22 (×4): 15 mg via INTRAVENOUS
  Filled 2022-04-21 (×4): qty 1

## 2022-04-21 MED ORDER — PANTOPRAZOLE SODIUM 40 MG PO TBEC
40.0000 mg | DELAYED_RELEASE_TABLET | Freq: Every day | ORAL | Status: DC
  Administered 2022-04-22: 40 mg via ORAL
  Filled 2022-04-21: qty 1

## 2022-04-21 MED ORDER — ORAL CARE MOUTH RINSE: 15.0000 mL | Freq: Once | OROMUCOSAL | Status: AC

## 2022-04-21 MED ORDER — FENTANYL CITRATE (PF) 100 MCG/2ML IJ SOLN
INTRAMUSCULAR | Status: DC | PRN
  Administered 2022-04-21: 50 ug via INTRAVENOUS
  Administered 2022-04-21: 100 ug via INTRAVENOUS

## 2022-04-21 MED ORDER — ROCURONIUM BROMIDE 100 MG/10ML IV SOLN
INTRAVENOUS | Status: DC | PRN
  Administered 2022-04-21: 10 mg via INTRAVENOUS
  Administered 2022-04-21: 50 mg via INTRAVENOUS
  Administered 2022-04-21 (×2): 20 mg via INTRAVENOUS

## 2022-04-21 MED ORDER — BUPIVACAINE-EPINEPHRINE 0.25% -1:200000 IJ SOLN
INTRAMUSCULAR | Status: DC | PRN
  Administered 2022-04-21: 30 mL

## 2022-04-21 MED ORDER — CEFAZOLIN SODIUM-DEXTROSE 2-4 GM/100ML-% IV SOLN
2.0000 g | INTRAVENOUS | Status: AC
  Administered 2022-04-21: 2 g via INTRAVENOUS
  Filled 2022-04-21: qty 100

## 2022-04-21 SURGICAL SUPPLY — 67 items
ADH SKN CLS APL DERMABOND .7 (GAUZE/BANDAGES/DRESSINGS) ×1
APL PRP STRL LF DISP 70% ISPRP (MISCELLANEOUS) ×1
APL SWBSTK 6 STRL LF DISP (MISCELLANEOUS) ×1
APPLICATOR COTTON TIP 6 STRL (MISCELLANEOUS) ×1 IMPLANT
APPLICATOR COTTON TIP 6IN STRL (MISCELLANEOUS) ×1
BAG COUNTER SPONGE SURGICOUNT (BAG) IMPLANT
BAG SPNG CNTER NS LX DISP (BAG)
CATH FOLEY 2WAY SLVR 18FR 30CC (CATHETERS) ×1 IMPLANT
CATH ROBINSON RED A/P 16FR (CATHETERS) ×1 IMPLANT
CATH ROBINSON RED A/P 8FR (CATHETERS) ×1 IMPLANT
CATH TIEMANN FOLEY 18FR 5CC (CATHETERS) ×1 IMPLANT
CHLORAPREP W/TINT 26 (MISCELLANEOUS) ×1 IMPLANT
CLIP LIGATING HEM O LOK PURPLE (MISCELLANEOUS) ×1 IMPLANT
COVER SURGICAL LIGHT HANDLE (MISCELLANEOUS) ×1 IMPLANT
COVER TIP SHEARS 8 DVNC (MISCELLANEOUS) ×1 IMPLANT
COVER TIP SHEARS 8MM DA VINCI (MISCELLANEOUS) ×1
CUTTER ECHEON FLEX ENDO 45 340 (ENDOMECHANICALS) ×1 IMPLANT
DERMABOND ADVANCED .7 DNX12 (GAUZE/BANDAGES/DRESSINGS) ×1 IMPLANT
DRAIN CHANNEL RND F F (WOUND CARE) IMPLANT
DRAPE ARM DVNC X/XI (DISPOSABLE) ×4 IMPLANT
DRAPE COLUMN DVNC XI (DISPOSABLE) ×1 IMPLANT
DRAPE DA VINCI XI ARM (DISPOSABLE) ×4
DRAPE DA VINCI XI COLUMN (DISPOSABLE) ×1
DRAPE SURG IRRIG POUCH 19X23 (DRAPES) ×1 IMPLANT
DRSG TEGADERM 4X4.75 (GAUZE/BANDAGES/DRESSINGS) ×1 IMPLANT
ELECT PENCIL ROCKER SW 15FT (MISCELLANEOUS) ×1 IMPLANT
ELECT REM PT RETURN 15FT ADLT (MISCELLANEOUS) ×1 IMPLANT
GAUZE 4X4 16PLY ~~LOC~~+RFID DBL (SPONGE) ×1 IMPLANT
GAUZE SPONGE 4X4 12PLY STRL (GAUZE/BANDAGES/DRESSINGS) ×1 IMPLANT
GLOVE BIO SURGEON STRL SZ 6.5 (GLOVE) ×1 IMPLANT
GLOVE SURG LX STRL 7.5 STRW (GLOVE) ×2 IMPLANT
GOWN SRG XL LVL 4 BRTHBL STRL (GOWNS) ×1 IMPLANT
GOWN STRL NON-REIN XL LVL4 (GOWNS) ×1
GOWN STRL REUS W/ TWL XL LVL3 (GOWN DISPOSABLE) ×2 IMPLANT
GOWN STRL REUS W/TWL XL LVL3 (GOWN DISPOSABLE) ×2
HEMOSTAT SURGICEL 4X8 (HEMOSTASIS) IMPLANT
HOLDER FOLEY CATH W/STRAP (MISCELLANEOUS) ×1 IMPLANT
IRRIG SUCT STRYKERFLOW 2 WTIP (MISCELLANEOUS) ×1
IRRIGATION SUCT STRKRFLW 2 WTP (MISCELLANEOUS) ×1 IMPLANT
IV LACTATED RINGERS 1000ML (IV SOLUTION) ×1 IMPLANT
KIT TURNOVER KIT A (KITS) IMPLANT
NDL SAFETY ECLIP 18X1.5 (MISCELLANEOUS) ×1 IMPLANT
PACK ROBOT UROLOGY CUSTOM (CUSTOM PROCEDURE TRAY) ×1 IMPLANT
RELOAD STAPLE 45 4.1 GRN THCK (STAPLE) ×1 IMPLANT
SEAL UNIV 5-12 XI (MISCELLANEOUS) ×3 IMPLANT
SEAL XI UNIVERSAL 5-12 (MISCELLANEOUS) ×3
SET TUBE SMOKE EVAC HIGH FLOW (TUBING) ×1 IMPLANT
SOL ELECTROSURG ANTI STICK (MISCELLANEOUS) ×1
SOLUTION ELECTROSURG ANTI STCK (MISCELLANEOUS) ×1 IMPLANT
SPIKE FLUID TRANSFER (MISCELLANEOUS) ×1 IMPLANT
STAPLE RELOAD 45 GRN (STAPLE) ×1 IMPLANT
STAPLE RELOAD 45MM GREEN (STAPLE) ×1
SUT ETHILON 3 0 PS 1 (SUTURE) ×1 IMPLANT
SUT MNCRL 3 0 RB1 (SUTURE) ×1 IMPLANT
SUT MNCRL 3 0 VIOLET RB1 (SUTURE) ×1 IMPLANT
SUT MNCRL AB 4-0 PS2 18 (SUTURE) ×2 IMPLANT
SUT MONOCRYL 3 0 RB1 (SUTURE) ×2
SUT PDS PLUS AB 0 CT-2 (SUTURE) ×2 IMPLANT
SUT VIC AB 0 CT1 27 (SUTURE) ×2
SUT VIC AB 0 CT1 27XBRD ANTBC (SUTURE) ×2 IMPLANT
SUT VIC AB 2-0 SH 27 (SUTURE) ×1
SUT VIC AB 2-0 SH 27X BRD (SUTURE) ×1 IMPLANT
SYR 27GX1/2 1ML LL SAFETY (SYRINGE) ×1 IMPLANT
TOWEL OR NON WOVEN STRL DISP B (DISPOSABLE) ×1 IMPLANT
TROCAR Z THREAD OPTICAL 12X100 (TROCAR) IMPLANT
TROCAR Z-THREAD FIOS 5X100MM (TROCAR) IMPLANT
WATER STERILE IRR 1000ML POUR (IV SOLUTION) ×1 IMPLANT

## 2022-04-21 NOTE — Anesthesia Postprocedure Evaluation (Signed)
Anesthesia Post Note  Patient: Luis Hardin  Procedure(s) Performed: XI ROBOTIC ASSISTED LAPAROSCOPIC RADICAL PROSTATECTOMY LEVEL 1 (Abdomen)     Patient location during evaluation: PACU Anesthesia Type: General Level of consciousness: awake and alert Pain management: pain level controlled Vital Signs Assessment: post-procedure vital signs reviewed and stable Respiratory status: spontaneous breathing, nonlabored ventilation, respiratory function stable and patient connected to nasal cannula oxygen Cardiovascular status: blood pressure returned to baseline and stable Postop Assessment: no apparent nausea or vomiting Anesthetic complications: no  No notable events documented.  Last Vitals:  Vitals:   04/21/22 1045 04/21/22 1141  BP: (!) 141/71 (!) 149/82  Pulse: 89 96  Resp: 17 18  Temp:  36.6 C  SpO2: 91% 100%    Last Pain:  Vitals:   04/21/22 1045  TempSrc:   PainSc: Asleep                 Venida Tsukamoto S

## 2022-04-21 NOTE — Transfer of Care (Signed)
Immediate Anesthesia Transfer of Care Note  Patient: Luis Hardin  Procedure(s) Performed: XI ROBOTIC ASSISTED LAPAROSCOPIC RADICAL PROSTATECTOMY LEVEL 1 (Abdomen)  Patient Location: PACU  Anesthesia Type:General  Level of Consciousness: awake, alert , oriented, drowsy, and patient cooperative  Airway & Oxygen Therapy: Patient Spontanous Breathing and Patient connected to face mask oxygen  Post-op Assessment: Report given to RN and Post -op Vital signs reviewed and stable  Post vital signs: stable  Last Vitals:  Vitals Value Taken Time  BP 139/61 04/21/22 1020  Temp    Pulse 93 04/21/22 1022  Resp    SpO2 97 % 04/21/22 1022  Vitals shown include unvalidated device data.  Last Pain:  Vitals:   04/21/22 0546  TempSrc: Oral  PainSc: 0-No pain         Complications: No notable events documented.

## 2022-04-21 NOTE — Discharge Instructions (Signed)

## 2022-04-21 NOTE — Interval H&P Note (Signed)
History and Physical Interval Note:  04/21/2022 6:54 AM  Luis Hardin  has presented today for surgery, with the diagnosis of PROSTATE CANCER.  The various methods of treatment have been discussed with the patient and family. After consideration of risks, benefits and other options for treatment, the patient has consented to  Procedure(s) with comments: XI ROBOTIC ASSISTED LAPAROSCOPIC RADICAL PROSTATECTOMY LEVEL 1 (N/A) - 210 MINUTES NEEDED [place] CASE as a surgical intervention.  The patient's history has been reviewed, patient examined, no change in status, stable for surgery.  I have reviewed the patient's chart and labs.  Questions were answered to the patient's satisfaction.     [employer]

## 2022-04-21 NOTE — Op Note (Signed)
Preoperative diagnosis: Clinically localized adenocarcinoma of the prostate (clinical stage T1c Nx Mx)  Postoperative diagnosis: Clinically localized adenocarcinoma of the prostate (clinical stage T1c Nx Mx)  Procedure:  Robotic assisted laparoscopic radical prostatectomy (bilateral nerve sparing)  Surgeon: Roxy Horseman, Brooke Bonito. M.D.  Assistant: Debbrah Alar, PA-C  An assistant was required for this surgical procedure.  The duties of the assistant included but were not limited to suctioning, passing suture, camera manipulation, retraction. This procedure would not be able to be performed without an Environmental consultant.  Resident: Dr. Josph Macho  Anesthesia: General  Complications: None  EBL: 200 mL  IVF:  1200 mL crystalloid  Specimens: Prostate and seminal vesicles  Disposition of specimens: Pathology  Drains: 20 Fr coude catheter # 19 Blake pelvic drain  Indication: Luis Hardin is a 57 y.o. year old patient with clinically localized prostate cancer.  After a thorough review of the management options for treatment of prostate cancer, he elected to proceed with surgical therapy and the above procedure(s).  We have discussed the potential benefits and risks of the procedure, side effects of the proposed treatment, the likelihood of the patient achieving the goals of the procedure, and any potential problems that might occur during the procedure or recuperation. Informed consent has been obtained.  Description of procedure:  The patient was taken to the operating room and a general anesthetic was administered. He was given preoperative antibiotics, placed in the dorsal lithotomy position, and prepped and draped in the usual sterile fashion. Next a preoperative timeout was performed. A urethral catheter was placed into the bladder and a site was selected near the umbilicus for placement of the camera port. This was placed using a standard open Hassan technique which allowed entry into  the peritoneal cavity under direct vision and without difficulty. An 8 mm port was placed and a pneumoperitoneum established. The camera was then used to inspect the abdomen and there was no evidence of any intra-abdominal injuries or other abnormalities. The remaining abdominal ports were then placed. 8 mm robotic ports were placed in the right lower quadrant, left lower quadrant, and far left lateral abdominal wall. A 5 mm port was placed in the right upper quadrant and a 12 mm port was placed in the right lateral abdominal wall for laparoscopic assistance. All ports were placed under direct vision without difficulty. The surgical cart was then docked.   Utilizing the cautery scissors, the bladder was reflected posteriorly allowing entry into the space of Retzius and identification of the endopelvic fascia and prostate. The periprostatic fat was then removed from the prostate allowing full exposure of the endopelvic fascia. The endopelvic fascia was then incised from the apex back to the base of the prostate bilaterally and the underlying levator muscle fibers were swept laterally off the prostate thereby isolating the dorsal venous complex.  There was an accessory right obturator artery that was preserved. The dorsal vein was then stapled and divided with a 45 mm Flex Echelon stapler. Attention then turned to the bladder neck which was divided anteriorly thereby allowing entry into the bladder and exposure of the urethral catheter. The catheter balloon was deflated and the catheter was brought into the operative field and used to retract the prostate anteriorly. The posterior bladder neck was then examined and was divided allowing further dissection between the bladder and prostate posteriorly until the vasa deferentia and seminal vessels were identified. The vasa deferentia were isolated, divided, and lifted anteriorly. The seminal vesicles were dissected down to  their tips with care to control the seminal  vascular arterial blood supply. These structures were then lifted anteriorly and the space between Denonvillier's fascia and the anterior rectum was developed with a combination of sharp and blunt dissection. This isolated the vascular pedicles of the prostate.  The lateral prostatic fascia was then sharply incised allowing release of the neurovascular bundles bilaterally. The vascular pedicles of the prostate were then ligated with Weck clips between the prostate and neurovascular bundles and divided with sharp cold scissor dissection resulting in neurovascular bundle preservation. The neurovascular bundles were then separated off the apex of the prostate and urethra bilaterally.  The urethra was then sharply transected allowing the prostate specimen to be disarticulated. The pelvis was copiously irrigated and hemostasis was ensured. There was no evidence for rectal injury.  Attention then turned to the urethral anastomosis. A 2-0 Vicryl slip knot was placed between Denonvillier's fascia, the posterior bladder neck, and the posterior urethra to reapproximate these structures. A double-armed 3-0 Monocryl suture was then used to perform a 360 running tension-free anastomosis between the bladder neck and urethra. A new urethral catheter was then placed into the bladder and irrigated. There were no blood clots within the bladder and the anastomosis appeared to be watertight. A #19 Blake drain was then brought through the left lateral 8 mm port site and positioned appropriately within the pelvis. It was secured to the skin with a nylon suture. The surgical cart was then undocked. The right lateral 12 mm port site was closed at the fascial level with a 0 Vicryl suture placed laparoscopically. All remaining ports were then removed under direct vision. The prostate specimen was removed intact within the Endopouch retrieval bag via the periumbilical camera port site. This fascial opening was closed with two running 0  Vicryl sutures. 0.25% Marcaine was then injected into all port sites and all incisions were reapproximated at the skin level with 4-0 Monocryl subcuticular sutures and liquid skin adhesive. The patient appeared to tolerate the procedure well and without complications. The patient was able to be extubated and transferred to the recovery unit in satisfactory condition.  Pryor Curia MD

## 2022-04-21 NOTE — Progress Notes (Signed)
Patient ID: Luis Hardin, male   DOB: 1965/10/04, 57 y.o.   MRN: [id number]  Post-op note  Subjective: The patient is doing well except for irritation of the left eye.  Objective: Vital signs in last 24 hours: Temp:  [97.8 F (36.6 C)-97.9 F (36.6 C)] 97.9 F (36.6 C) (03/21 1141) Pulse Rate:  [85-96] 96 (03/21 1141) Resp:  [11-20] 18 (03/21 1141) BP: (124-149)/(61-89) 149/82 (03/21 1141) SpO2:  [91 %-100 %] 100 % (03/21 1141) Weight:  [96 kg] 96 kg (03/21 0546)  Intake/Output from previous day: No intake/output data recorded. Intake/Output this shift: Total I/O In: 1200 [I.V.:1200] Out: 200 [Blood:200]  Physical Exam:  General: Alert and oriented. Abdomen: Soft, Nondistended. Incisions: Clean and dry. GU: Urine clearing.  Lab Results: Recent Labs    04/21/22 1043  HGB 13.8  HCT 40.9    Assessment/Plan: POD#0   1) Continue to monitor, ambulate, IS, artificial tears   Pryor Curia. MD   LOS: 0 days   Dutch Gray 04/21/2022, 2:38 PM

## 2022-04-21 NOTE — Anesthesia Preprocedure Evaluation (Signed)
Anesthesia Evaluation  Patient identified by MRN, date of birth, ID band Patient awake    Reviewed: Allergy & Precautions, H&P , NPO status , Patient's Chart, lab work & pertinent test results  Airway Mallampati: II  TM Distance: >3 FB Neck ROM: Full    Dental no notable dental hx.    Pulmonary neg pulmonary ROS, former smoker   Pulmonary exam normal breath sounds clear to auscultation       Cardiovascular hypertension, Normal cardiovascular exam Rhythm:Regular Rate:Normal     Neuro/Psych negative neurological ROS  negative psych ROS   GI/Hepatic Neg liver ROS, hiatal hernia,GERD  ,,  Endo/Other  negative endocrine ROS    Renal/GU negative Renal ROS  negative genitourinary   Musculoskeletal negative musculoskeletal ROS (+)    Abdominal   Peds negative pediatric ROS (+)  Hematology negative hematology ROS (+)   Anesthesia Other Findings   Reproductive/Obstetrics negative OB ROS                             Anesthesia Physical Anesthesia Plan  ASA: 2  Anesthesia Plan: General   Post-op Pain Management: Ofirmev IV (intra-op)* and Toradol IV (intra-op)*   Induction: Intravenous  PONV Risk Score and Plan: 2 and Ondansetron, Dexamethasone and Treatment may vary due to age or medical condition  Airway Management Planned: Oral ETT  Additional Equipment:   Intra-op Plan:   Post-operative Plan: Extubation in OR  Informed Consent: I have reviewed the patients History and Physical, chart, labs and discussed the procedure including the risks, benefits and alternatives for the proposed anesthesia with the patient or authorized representative who has indicated his/her understanding and acceptance.     Dental advisory given  Plan Discussed with: CRNA and Surgeon  Anesthesia Plan Comments:        Anesthesia Quick Evaluation

## 2022-04-21 NOTE — Anesthesia Procedure Notes (Signed)
Procedure Name: Intubation Date/Time: 04/21/2022 7:35 AM  Performed by: Pilar Grammes, CRNAPre-anesthesia Checklist: Patient identified, Emergency Drugs available, Suction available, Patient being monitored and Timeout performed Patient Re-evaluated:Patient Re-evaluated prior to induction Oxygen Delivery Method: Circle system utilized Preoxygenation: Pre-oxygenation with 100% oxygen Induction Type: IV induction Ventilation: Mask ventilation without difficulty Laryngoscope Size: Glidescope, Mac and 4 Grade View: Grade I Tube type: Oral Tube size: 7.5 mm Number of attempts: 1 Airway Equipment and Method: Stylet Placement Confirmation: positive ETCO2, ETT inserted through vocal cords under direct vision, CO2 detector and breath sounds checked- equal and bilateral Secured at: 25 cm Tube secured with: Tape Dental Injury: Teeth and Oropharynx as per pre-operative assessment

## 2022-04-22 ENCOUNTER — Encounter (HOSPITAL_COMMUNITY): Payer: Self-pay | Admitting: Urology

## 2022-04-22 DIAGNOSIS — C61 Malignant neoplasm of prostate: Secondary | ICD-10-CM | POA: Diagnosis not present

## 2022-04-22 LAB — HEMOGLOBIN AND HEMATOCRIT, BLOOD
HCT: 34.9 % — ABNORMAL LOW (ref 39.0–52.0)
Hemoglobin: 12.2 g/dL — ABNORMAL LOW (ref 13.0–17.0)

## 2022-04-22 MED ORDER — TRAMADOL HCL 50 MG PO TABS: 50.0000 mg | ORAL_TABLET | Freq: Four times a day (QID) | ORAL | Status: DC | PRN

## 2022-04-22 MED ORDER — OXYCODONE HCL 5 MG PO TABS: 10.0000 mg | ORAL_TABLET | ORAL | Status: DC | PRN

## 2022-04-22 MED ORDER — ACETAMINOPHEN 500 MG PO TABS
1000.0000 mg | ORAL_TABLET | Freq: Four times a day (QID) | ORAL | Status: DC
  Administered 2022-04-22: 1000 mg via ORAL
  Filled 2022-04-22: qty 2

## 2022-04-22 MED ORDER — BISACODYL 10 MG RE SUPP
10.0000 mg | Freq: Once | RECTAL | Status: AC
  Administered 2022-04-22: 10 mg via RECTAL
  Filled 2022-04-22: qty 1

## 2022-04-22 MED ORDER — MORPHINE SULFATE (PF) 2 MG/ML IV SOLN: 2.0000 mg | INTRAVENOUS | Status: DC | PRN

## 2022-04-22 MED ORDER — OXYCODONE HCL 5 MG PO TABS: 5.0000 mg | ORAL_TABLET | ORAL | Status: DC | PRN

## 2022-04-22 NOTE — Progress Notes (Signed)
.   Transition of Care [facility]) Screening Note   Patient Details  Name: Luis Hardin Date of Birth: 17-Apr-1965   Transition of Care [facility]) CM/SW Contact:    Illene Regulus, LCSW Phone Number: 04/22/2022, 1:16 PM    Transition of Care Department [facility]) has reviewed patient and no TOC needs have been identified at this time. We will continue to monitor patient advancement through interdisciplinary progression rounds. If new patient transition needs arise, please place a TOC consult.

## 2022-04-22 NOTE — Discharge Summary (Signed)
[facility] Discharge Summary  Admit date: 04/21/2022  Discharge date and time: 04/22/22   Discharge to: Home  Discharge Service: Urology  Discharge Attending Physician:  Dr. Alinda Money, MD  Discharge  Diagnoses: Prostate cancer [facility])  Secondary Diagnosis: Principal Problem:   Prostate cancer ([place])   OR Procedures: Procedure(s): XI ROBOTIC ASSISTED LAPAROSCOPIC RADICAL PROSTATECTOMY LEVEL 1 04/21/2022   Ancillary Procedures: None   Discharge Day Services: The patient was seen and examined by the Urology team both in the morning and immediately prior to discharge.  Vital signs and laboratory values were stable and within normal limits.  The physical exam was benign and unchanged and all surgical wounds were examined.  Discharge instructions were explained and all questions answered.  Subjective  No acute events overnight. Pain Controlled. No fever or chills.  Objective Patient Vitals for the past 8 hrs:  BP Temp Temp src Pulse Resp SpO2  04/22/22 1124 (!) 150/87 98.1 F (36.7 C) Oral 94 18 96 %  04/22/22 0618 (!) 154/87 98.1 F (36.7 C) Oral 93 18 100 %   Total I/O In: 240 [P.O.:240] Out: 500 [Urine:500]  General Appearance:        No acute distress Lungs:                       Normal work of breathing on room air Heart:                                Regular rate and rhythm Abdomen:                         Soft, appropriately tender, non-distended, incisions c/d/I GU:          Foley catheter in place to gravity drainage Extremities:                      Warm and well perfused   Hospital Course:  The patient underwent robotic prostatectomy on 04/21/2022.  The patient tolerated the procedure well, was extubated in the OR, and afterwards was taken to the PACU for routine post-surgical care. When stable the patient was transferred to the floor.   The patient did well postoperatively.  The patient's diet was slowly advanced and at the time of discharge was tolerating a  regular diet.  The patient was discharged home 1 Day Post-Op, at which point was tolerating a regular solid diet, was able to make adequate urine via foley catheter, have adequate pain control with P.O. pain medication, and could ambulate without difficulty. The patient will follow up with [place] for post op check and catheter removal.   Condition at Discharge: Improved  Discharge Medications:  Allergies as of 04/22/2022   No Known Allergies      Medication List     STOP taking these medications    Fish Oil 1200 MG Caps   ibuprofen 200 MG tablet Commonly known as: ADVIL   multivitamin with minerals Tabs tablet       TAKE these medications    acetaminophen 500 MG tablet Commonly known as: TYLENOL Take 1,000 mg by mouth every 6 (six) hours as needed for moderate pain.   docusate sodium 100 MG capsule Commonly known as: COLACE Take 1 capsule (100 mg total) by mouth 2 (two) times daily.   lisinopril 40 MG tablet Commonly known as: ZESTRIL Take 40 mg  by mouth daily with breakfast.   omeprazole 20 MG capsule Commonly known as: PRILOSEC Take 20 mg by mouth daily.   sildenafil 100 MG tablet Commonly known as: VIAGRA Take 50-100 mg by mouth daily as needed for erectile dysfunction.   sodium chloride 0.65 % Soln nasal spray Commonly known as: OCEAN Place 1 spray into both nostrils as needed for congestion.   sulfamethoxazole-trimethoprim 800-160 MG tablet Commonly known as: BACTRIM DS Take 1 tablet by mouth 2 (two) times daily. Start the day prior to foley removal appointment   traMADol 50 MG tablet Commonly known as: Ultram Take 1-2 tablets (50-100 mg total) by mouth every 6 (six) hours as needed for moderate pain or severe pain.   triamcinolone cream 0.1 % Commonly known as: KENALOG Apply 1 Application topically 2 (two) times daily as needed (eczema).   VISINE OP Place 1 drop into both eyes daily as needed (irritation).

## 2022-04-22 NOTE — Progress Notes (Addendum)
Patient ID: Luis Hardin, male   DOB: 1966-01-14, 57 y.o.   MRN: [id number] 1 Day Post-Op Subjective: The patient is doing well.  No nausea or vomiting. Pain is adequately controlled. Left eye discomfort resolved.  Objective: Vital signs in last 24 hours: Temp:  [97.8 F (36.6 C)-98.7 F (37.1 C)] 98.1 F (36.7 C) (03/22 0618) Pulse Rate:  [89-106] 93 (03/22 0618) Resp:  [11-20] 18 (03/22 0618) BP: (124-154)/(61-88) 154/87 (03/22 0618) SpO2:  [91 %-100 %] 100 % (03/22 0618)  Intake/Output from previous day: 03/21 0701 - 03/22 0700 In: 4265.1 [P.O.:1070; I.V.:3195.1] Out: [id number] [Urine:2875; Drains:70; Blood:200] Intake/Output this shift: No intake/output data recorded.  Physical Exam:  General: Alert and oriented. CV: RRR Lungs: Clear bilaterally. GI: Soft, Nondistended. Incisions: Clean, dry, and intact Urine: Clear Extremities: Nontender, no erythema, no edema.  Lab Results: Recent Labs    04/21/22 1043 04/22/22 0442  HGB 13.8 12.2*  HCT 40.9 34.9*      Assessment/Plan: POD# 1 s/p robotic prostatectomy.  1) SL IVF 2) Ambulate, Incentive spirometry 3) Transition to oral pain medication 4) Dulcolax suppository 5) D/C pelvic drain 6) Plan for likely discharge later today   Pryor Curia. MD   LOS: 0 days   Dutch Gray 04/22/2022, 7:28 AM

## 2022-04-22 NOTE — Progress Notes (Signed)
Pt discharging to home at this time. Prior to DC, IV and JP drain were removed. Pt was given DC paperwork regarding care, condition, medications and follow up instructions. Pt and wife verbalized understanding and stated no conerns at the time. Pt stable at time of DC and left in personal vehicle

## 2022-04-26 LAB — SURGICAL PATHOLOGY

## 2022-06-13 ENCOUNTER — Other Ambulatory Visit: Payer: 59

## 2022-06-14 ENCOUNTER — Ambulatory Visit (HOSPITAL_COMMUNITY): Payer: 59

## 2022-06-21 ENCOUNTER — Ambulatory Visit: Payer: 59 | Admitting: Urology

## 2023-05-15 ENCOUNTER — Emergency Department (HOSPITAL_COMMUNITY)

## 2023-05-15 ENCOUNTER — Other Ambulatory Visit: Payer: Self-pay

## 2023-05-15 ENCOUNTER — Observation Stay (HOSPITAL_COMMUNITY)
Admission: EM | Admit: 2023-05-15 | Discharge: 2023-05-18 | Disposition: A | Discharge status: Home / Self Care | Attending: Emergency Medicine | Admitting: Emergency Medicine

## 2023-05-15 ENCOUNTER — Encounter (HOSPITAL_COMMUNITY): Payer: Self-pay | Admitting: Emergency Medicine

## 2023-05-15 DIAGNOSIS — R079 Chest pain, unspecified: Secondary | ICD-10-CM | POA: Diagnosis present

## 2023-05-15 DIAGNOSIS — R7989 Other specified abnormal findings of blood chemistry: Secondary | ICD-10-CM | POA: Diagnosis not present

## 2023-05-15 DIAGNOSIS — Z87891 Personal history of nicotine dependence: Secondary | ICD-10-CM | POA: Diagnosis not present

## 2023-05-15 DIAGNOSIS — Z79899 Other long term (current) drug therapy: Secondary | ICD-10-CM | POA: Diagnosis not present

## 2023-05-15 DIAGNOSIS — T887XXA Unspecified adverse effect of drug or medicament, initial encounter: Secondary | ICD-10-CM

## 2023-05-15 DIAGNOSIS — C61 Malignant neoplasm of prostate: Secondary | ICD-10-CM | POA: Diagnosis not present

## 2023-05-15 DIAGNOSIS — R0789 Other chest pain: Secondary | ICD-10-CM | POA: Diagnosis present

## 2023-05-15 DIAGNOSIS — I517 Cardiomegaly: Secondary | ICD-10-CM

## 2023-05-15 DIAGNOSIS — I10 Essential (primary) hypertension: Secondary | ICD-10-CM | POA: Diagnosis present

## 2023-05-15 LAB — CBC
HCT: 39.9 % (ref 39.0–52.0)
Hemoglobin: 13.6 g/dL (ref 13.0–17.0)
MCH: 30.6 pg (ref 26.0–34.0)
MCHC: 34.1 g/dL (ref 30.0–36.0)
MCV: 89.7 fL (ref 80.0–100.0)
Platelets: 262 10*3/uL (ref 150–400)
RBC: 4.45 MIL/uL (ref 4.22–5.81)
RDW: 12.4 % (ref 11.5–15.5)
WBC: 9.5 10*3/uL (ref 4.0–10.5)
nRBC: 0 % (ref 0.0–0.2)

## 2023-05-15 LAB — BASIC METABOLIC PANEL WITH GFR
Anion gap: 9 (ref 5–15)
BUN: 11 mg/dL (ref 6–20)
CO2: 23 mmol/L (ref 22–32)
Calcium: 9.2 mg/dL (ref 8.9–10.3)
Chloride: 105 mmol/L (ref 98–111)
Creatinine, Ser: 1.02 mg/dL (ref 0.61–1.24)
GFR, Estimated: >60 mL/min (ref >60)
Glucose, Bld: 97 mg/dL (ref 70–99)
Potassium: 4 mmol/L (ref 3.5–5.1)
Sodium: 137 mmol/L (ref 135–145)

## 2023-05-15 LAB — HEPATIC FUNCTION PANEL
ALT: 19 U/L (ref 0–44)
AST: 17 U/L (ref 15–41)
Albumin: 4 g/dL (ref 3.5–5.0)
Alkaline Phosphatase: 64 U/L (ref 38–126)
Bilirubin, Direct: <0.1 mg/dL (ref 0.0–0.2)
Total Bilirubin: 0.2 mg/dL (ref 0.0–1.2)
Total Protein: 6.6 g/dL (ref 6.5–8.1)

## 2023-05-15 LAB — D-DIMER, QUANTITATIVE: D-Dimer, Quant: 0.41 ug{FEU}/mL (ref 0.00–0.50)

## 2023-05-15 LAB — TROPONIN I (HIGH SENSITIVITY)
Troponin I (High Sensitivity): 26 ng/L — ABNORMAL HIGH (ref <18)
Troponin I (High Sensitivity): 21 ng/L — ABNORMAL HIGH (ref <18)

## 2023-05-15 MED ORDER — ENOXAPARIN SODIUM 40 MG/0.4ML IJ SOSY
40.0000 mg | PREFILLED_SYRINGE | INTRAMUSCULAR | Status: DC
  Administered 2023-05-15 – 2023-05-17 (×3): 40 mg via SUBCUTANEOUS
  Filled 2023-05-15 (×3): qty 0.4

## 2023-05-15 MED ORDER — POLYETHYLENE GLYCOL 3350 17 G PO PACK: 17.0000 g | PACK | Freq: Every day | ORAL | Status: DC | PRN

## 2023-05-15 MED ORDER — ONDANSETRON HCL 4 MG/2ML IJ SOLN: 4.0000 mg | Freq: Four times a day (QID) | INTRAMUSCULAR | Status: DC | PRN

## 2023-05-15 MED ORDER — LISINOPRIL 10 MG PO TABS
40.0000 mg | ORAL_TABLET | Freq: Every day | ORAL | Status: DC
  Administered 2023-05-16: 40 mg via ORAL
  Filled 2023-05-15: qty 4

## 2023-05-15 MED ORDER — ONDANSETRON HCL 4 MG PO TABS: 4.0000 mg | ORAL_TABLET | Freq: Four times a day (QID) | ORAL | Status: DC | PRN

## 2023-05-15 MED ORDER — ACETAMINOPHEN 325 MG PO TABS: 650.0000 mg | ORAL_TABLET | Freq: Four times a day (QID) | ORAL | Status: DC | PRN

## 2023-05-15 MED ORDER — ASPIRIN 81 MG PO TBEC
81.0000 mg | DELAYED_RELEASE_TABLET | Freq: Every day | ORAL | Status: DC
  Administered 2023-05-16 – 2023-05-18 (×3): 81 mg via ORAL
  Filled 2023-05-15 (×3): qty 1

## 2023-05-15 MED ORDER — PANTOPRAZOLE SODIUM 40 MG PO TBEC
40.0000 mg | DELAYED_RELEASE_TABLET | Freq: Every day | ORAL | Status: DC
  Administered 2023-05-16 – 2023-05-18 (×3): 40 mg via ORAL
  Filled 2023-05-15 (×3): qty 1

## 2023-05-15 MED ORDER — ACETAMINOPHEN 650 MG RE SUPP: 650.0000 mg | Freq: Four times a day (QID) | RECTAL | Status: DC | PRN

## 2023-05-15 MED ORDER — ASPIRIN 81 MG PO CHEW
324.0000 mg | CHEWABLE_TABLET | Freq: Once | ORAL | Status: AC
  Administered 2023-05-15: 324 mg via ORAL
  Filled 2023-05-15: qty 4

## 2023-05-15 NOTE — Assessment & Plan Note (Signed)
 Hold Viagra

## 2023-05-15 NOTE — H&P (Signed)
 History and Physical    Luis Hardin [id number] DOB: 07/12/1965 DOA: 05/15/2023  PCP: Alvina Filbert, MD   Patient coming from: Home  I have personally briefly reviewed patient's old medical records in [facility]  Chief Complaint: Chest pain  HPI: Luis Hardin is a 58 y.o. male with medical history significant for prostate cancer, hypertension.  Patient presented to the ED with complaint of chest pain over the past month, he reports 4 episodes of this.  This chest pains have occurred with sexual activity or related to or thinking about sexual activity, when he took Viagra which was prescribed for his prostate,  Chest pain is mid chest, radiates down his left arm.  He reduced the dose of Viagra from 100 to 50 mg and noticed reduced intensity of his chest pain.  This pain lasted about 10 to 20 minutes.  No prior cardiac disease.  Prior to the past month he has never had chest pains or dyspnea with exertion or at rest.  ED Course: Stable vitals. Troponin 26 > 21. EKG without acute changes.  Chest x-ray negative for acute abnormality.  D-dimer normal at 0.41. Aspirin 324 mg given in ED. Evaluated by cardiology in the ED, trend troponins x 3, if troponin significantly elevated, starts ACS protocol and transferred to [facility] for left heart cath.  Otherwise will need cardiac CT to definitely rule out severe coronary artery disease.  Obtain echo.  Review of Systems: As per HPI all other systems reviewed and negative.  Past Medical History:  Diagnosis Date   Cancer (HCC) 2023   prostate cancer   Dry cough    GERD (gastroesophageal reflux disease)    H/O hiatal hernia    Hyperlipidemia    Hypertension    Inguinal hernia    RIGHT   Tendonitis    KNEES    Past Surgical History:  Procedure Laterality Date   COLONOSCOPY N/A 01/04/2016   Procedure: COLONOSCOPY;  Surgeon: West Bali, MD;  [facility];  Service: Endoscopy;  Laterality: N/A;  1:00 PM   COLONOSCOPY  WITH PROPOFOL N/A 08/16/2021   Procedure: COLONOSCOPY WITH PROPOFOL;  Surgeon: Lanelle Bal, DO;  [facility];  Service: Endoscopy;  Laterality: N/A;  7:30am, asa 2 (needed a monday)   ESOPHAGOGASTRODUODENOSCOPY  11/2007   Dhiflet: ?short segment Barrett's endoscopically but biopsies did not confirm.  Bx "eosinophilic esophagitis, acute and chronic inflammation of distal esophagus and gastric cardia"   ESOPHAGOGASTRODUODENOSCOPY  03/2003   Shiflett: ?short segment Barrett's, biopsy showed chronic carditis.    INGUINAL HERNIA REPAIR Right 02/12/2013   Procedure: RIGHT INGUINAL HERNIA REPAIR;  Surgeon: Shelly Rubenstein, MD;  Location: [facility];  Service: General;  Laterality: Right;   INSERTION OF MESH N/A 02/12/2013   Procedure: INSERTION OF MESH;  Surgeon: Shelly Rubenstein, MD;  [facility];  Service: General;  Laterality: N/A;   POLYPECTOMY  08/16/2021   Procedure: POLYPECTOMY INTESTINAL;  Surgeon: Lanelle Bal, DO;  [facility];  Service: Endoscopy;;   PROSTATECTOMY  04/2022   ROBOT ASSISTED LAPAROSCOPIC RADICAL PROSTATECTOMY N/A 04/21/2022   Procedure: XI ROBOTIC ASSISTED LAPAROSCOPIC RADICAL PROSTATECTOMY LEVEL 1;  Surgeon: Heloise Purpura, MD;  Location[facility];  Service: Urology;  Laterality: N/A;  210 MINUTES NEEDED FRO CASE   SHOULDER SURGERY Left 2009   L SHOULDER   TUMOR REMOVED  1990   LEFT SIDE OF NECK (BENIGN)     reports that he quit  smoking about 35 years ago. His smoking use included cigarettes. He started smoking about 40 years ago. He has a 1.3 pack-year smoking history. He has never used smokeless tobacco. He reports that he does not drink alcohol and does not use drugs.  No Known Allergies  Family History  Problem Relation Age of Onset   Heart disease Mother    Cancer Father        lung   Cancer Brother        skin   Colon polyps Brother    Cancer Paternal Grandfather        mouth   Colon polyps Maternal Uncle     Prior  to Admission medications   Medication Sig Start Date End Date Taking? Authorizing Provider  acetaminophen (TYLENOL) 500 MG tablet Take 1,000 mg by mouth every 6 (six) hours as needed for moderate pain.   Yes [provider]  ibuprofen (ADVIL) 200 MG tablet Take 200 mg by mouth every 6 (six) hours as needed for mild pain (pain score 1-3).   Yes [provider]  lisinopril (PRINIVIL,ZESTRIL) 40 MG tablet Take 40 mg by mouth daily with breakfast.    Yes [provider]  omeprazole (PRILOSEC) 20 MG capsule Take 20 mg by mouth daily.   Yes [provider]  sildenafil (VIAGRA) 100 MG tablet Take 50-100 mg by mouth daily. 02/07/22  Yes [provider]  Tetrahydrozoline HCl (VISINE OP) Place 1 drop into both eyes daily as needed (irritation).   Yes [provider]  triamcinolone cream (KENALOG) 0.1 % Apply 1 Application topically 2 (two) times daily as needed (eczema). 02/28/22  Yes [provider]    Physical Exam: Vitals:   05/15/23 2000 05/15/23 2056 05/15/23 2100 05/15/23 2130  BP: 123/76 120/73 111/71 125/74  Pulse: 80 84 80 84  Resp: 18 17 20  (!) 23  Temp:      TempSrc:      SpO2: 95% 96% 95% 96%  Weight:      Height:        Constitutional: NAD, calm, comfortable Vitals:   05/15/23 2000 05/15/23 2056 05/15/23 2100 05/15/23 2130  BP: 123/76 120/73 111/71 125/74  Pulse: 80 84 80 84  Resp: 18 17 20  (!) 23  Temp:      TempSrc:      SpO2: 95% 96% 95% 96%  Weight:      Height:       Eyes: PERRL, lids and conjunctivae normal ENMT: Mucous membranes are moist.  Neck: normal, supple, no masses, no thyromegaly Respiratory: clear to auscultation bilaterally, no wheezing, no crackles. Normal respiratory effort. No accessory muscle use.  Cardiovascular: Regular rate and rhythm, no murmurs / rubs / gallops. No extremity edema.  Extremities warm. Abdomen: no tenderness, no masses palpated. No hepatosplenomegaly. Bowel sounds positive.   Musculoskeletal: no clubbing / cyanosis. No joint deformity upper and lower extremities.  Skin: no rashes, lesions, ulcers. No induration Neurologic:  No facial assymmetry, moving extremities spontaneously, speech fluent Psychiatric: Normal judgment and insight. Alert and oriented x 3. Normal mood.   Labs on Admission: I have personally reviewed following labs and imaging studies  CBC: Recent Labs  Lab 05/15/23 1509  WBC 9.5  HGB 13.6  HCT 39.9  MCV 89.7  PLT 262   Basic Metabolic Panel: Recent Labs  Lab 05/15/23 1509  NA 137  K 4.0  CL 105  CO2 23  GLUCOSE 97  BUN 11  CREATININE 1.02  CALCIUM  9.2   GFR: Estimated Creatinine Clearance: 90.3 mL/min (by C-G formula based on SCr of 1.02 mg/dL). Liver Function Tests: Recent Labs  Lab 05/15/23 1509  AST 17  ALT 19  ALKPHOS 64  BILITOT 0.2  PROT 6.6  ALBUMIN 4.0   Radiological Exams on Admission: DG Chest 2 View Result Date: 05/15/2023 CLINICAL DATA:  Chest pain, left arm pain EXAM: CHEST - 2 VIEW COMPARISON:  02/08/2013 FINDINGS: The heart size and mediastinal contours are within normal limits. Both lungs are clear. The visualized skeletal structures are unremarkable. IMPRESSION: No active cardiopulmonary disease. Electronically Signed   By: Janeece Mechanic M.D.   On: 05/15/2023 18:12    EKG: Independently reviewed.  Sinus rhythm, rate 92, QTc 427.  No significant ST or T wave abnormality compared to prior EKG.  Assessment/Plan Principal Problem:   Chest pain Active Problems:   Prostate cancer (HCC)   HTN (hypertension)  Assessment and Plan: * Chest pain Chest pain while on Viagra, related to sexual activity.  Troponin 26 > 21.  KG without acute changes.  D-dimer 0.41.  Chest x-ray clear. - Evaluated by cardiology- Dr. Mallipeddi in the ED, trend troponins x 3, if troponin significantly elevated, starts ACS protocol and transferred to [facility] for [facility] heart cath.  Otherwise will need cardiac CT to definitely  rule out severe coronary artery disease.  - Obtain echo. -Aspirin 325 mg given, continue 81 mg daily  HTN (hypertension) Resume lisinopril  Prostate cancer (HCC) Hold Viagra  DVT prophylaxis: Lovenox Code Status: FULL code Family Communication: Spouse at bedside Disposition Plan: ~ 2 days Consults called: Cardiology Admission status: Obs tele   Author: Pati Bonine, MD 05/15/2023 10:07 PM  For on call review [email or web address].

## 2023-05-15 NOTE — Assessment & Plan Note (Signed)
 Chest pain while on Viagra, related to sexual activity.  Troponin 26 > 21.  KG without acute changes.  D-dimer 0.41.  Chest x-ray clear. - Evaluated by cardiology- Dr. Mallipeddi in the ED, trend troponins x 3, if troponin significantly elevated, starts ACS protocol and transferred to [facility] for left heart cath.  Otherwise will need cardiac CT to definitely rule out severe coronary artery disease.  - Obtain echo. -Aspirin 325 mg given, continue 81 mg daily

## 2023-05-15 NOTE — Consult Note (Addendum)
 CARDIOLOGY CONSULT NOTE    Patient ID: Luis Hardin; [id number]; 27-Aug-1965   Admit date: 05/15/2023 Date of Consult: 05/15/2023  Primary Care Provider: Alvina Filbert, MD Primary Cardiologist:  Primary Electrophysiologist:     History of Present Illness:   Luis Hardin is a 58 y/o M known to have HTN, HLD, prostate adenocarcinoma s/p radical prostatectomy in 2024 presented to the ER with chest pain.  Patient had 4 episodes of intense chest pain in the last 1 month and 1 episode of mild chest pain. He is currently taking sildenafil 100 mg once daily for his prostate issues.  In the last 1 month, he developed 1 episode of chest pain every week after he took sildenafil. This chest pain lasted for about 10 to 20 minutes before it resolved spontaneously. This chest pain occurred twice during sexual intercourse and once thinking about it. On Saturday night, he developed similar intense chest pains radiating to his left arm and when asked, he reported that he was fooling around with his wife at that time.  The following day, on Sunday night, he decreased the dose of sildenafil from 100 mg to 50 mg, later noticed mild intensity of chest pains at 1 AM in the night.  Chest pains did not wake him up from the sleep.  He was already awake.  He did not have any prior ischemia evaluation.  No prior MI/PCI/CABG.  EKG showed NSR, no new ischemia. Hs troponins mildly elevated, 26.  Quit chewing tobacco 30 years ago.   Past Medical History:  Diagnosis Date   Cancer (HCC) 2023   prostate cancer   Dry cough    GERD (gastroesophageal reflux disease)    H/O hiatal hernia    Hyperlipidemia    Hypertension    Inguinal hernia    RIGHT   Tendonitis    KNEES    Past Surgical History:  Procedure Laterality Date   COLONOSCOPY N/A 01/04/2016   Procedure: COLONOSCOPY;  Surgeon: West Bali, MD;  [facility];  Service: Endoscopy;  Laterality: N/A;  1:00 PM   COLONOSCOPY WITH PROPOFOL N/A  08/16/2021   Procedure: COLONOSCOPY WITH PROPOFOL;  Surgeon: Lanelle Bal, DO;  [facility];  Service: Endoscopy;  Laterality: N/A;  7:30am, asa 2 (needed a monday)   ESOPHAGOGASTRODUODENOSCOPY  11/2007   Dhiflet: ?short segment Barrett's endoscopically but biopsies did not confirm.  Bx "eosinophilic esophagitis, acute and chronic inflammation of distal esophagus and gastric cardia"   ESOPHAGOGASTRODUODENOSCOPY  03/2003   Shiflett: ?short segment Barrett's, biopsy showed chronic carditis.    INGUINAL HERNIA REPAIR Right 02/12/2013   Procedure: RIGHT INGUINAL HERNIA REPAIR;  Surgeon: Shelly Rubenstein, MD;  [facility];  Service: General;  Laterality: Right;   INSERTION OF MESH N/A 02/12/2013   Procedure: INSERTION OF MESH;  Surgeon: Shelly Rubenstein, MD;  [facility];  Service: General;  Laterality: N/A;   POLYPECTOMY  08/16/2021   Procedure: POLYPECTOMY INTESTINAL;  Surgeon: Lanelle Bal, DO;  [facility];  Service: Endoscopy;;   PROSTATECTOMY  04/2022   ROBOT ASSISTED LAPAROSCOPIC RADICAL PROSTATECTOMY N/A 04/21/2022   Procedure: XI ROBOTIC ASSISTED LAPAROSCOPIC RADICAL PROSTATECTOMY LEVEL 1;  Surgeon: Heloise Purpura, MD;  [facility];  Service: Urology;  Laterality: N/A;  210 MINUTES NEEDED FRO CASE   SHOULDER SURGERY Left 2009   L SHOULDER   TUMOR REMOVED  1990   LEFT SIDE OF NECK (BENIGN)    Inpatient Medications:  Scheduled Meds:  Continuous Infusions:  PRN Meds:   Allergies:   No Known Allergies  Social History:   Social History   Socioeconomic History   Marital status: Married    Spouse name: Not on file   Number of children: Not on file   Years of education: Not on file   Highest education level: Not on file  Occupational History   Occupation: textiles  Tobacco Use   Smoking status: Former    Current packs/day: 0.00    Average packs/day: 0.3 packs/day for 5.0 years (1.3 ttl pk-yrs)    Types: Cigarettes    Start  date: 02/01/1983    Quit date: 02/01/1988    Years since quitting: 35.3   Smokeless tobacco: Never  Vaping Use   Vaping status: Never Used  Substance and Sexual Activity   Alcohol use: No   Drug use: No   Sexual activity: Yes  Other Topics Concern   Not on file  Social History Narrative   Not on file   Social Drivers of Health   Financial Resource Strain: Not on file  Food Insecurity: No Food Insecurity (04/21/2022)   Hunger Vital Sign    Worried About Running Out of Food in the Last Year: Never true    Ran Out of Food in the Last Year: Never true  Transportation Needs: No Transportation Needs (04/21/2022)   PRAPARE - Administrator, Civil Service (Medical): No    Lack of Transportation (Non-Medical): No  Physical Activity: Not on file  Stress: Not on file  Social Connections: Not on file  Intimate Partner Violence: Not At Risk (04/21/2022)   Humiliation, Afraid, Rape, and Kick questionnaire    Fear of Current or Ex-Partner: No    Emotionally Abused: No    Physically Abused: No    Sexually Abused: No    Family History:   Family History  Problem Relation Age of Onset   Heart disease Mother    Cancer Father        lung   Cancer Brother        skin   Colon polyps Brother    Cancer Paternal Grandfather        mouth   Colon polyps Maternal Uncle      ROS:  Please see the history of present illness.  ROS  All other ROS reviewed and negative.     Physical Exam/Data:   Vitals:   05/15/23 1432 05/15/23 1433  BP: 127/88   Pulse: 92   Resp: 16   Temp: 98.7 F (37.1 C)   TempSrc: Oral   SpO2: 97%   Weight:  96.2 kg  Height:  5\' 9"  (1.753 m)   No intake or output data in the 24 hours ending 05/15/23 1643 Filed Weights   05/15/23 1433  Weight: 96.2 kg   Body mass index is 31.31 kg/m.  General:  Well nourished, well developed, in no acute distress HEENT: normal Lymph: no adenopathy Neck: no JVD Endocrine:  No thryomegaly Vascular: No carotid  bruits; FA pulses 2+ bilaterally without bruits  Cardiac:  normal S1, S2; RRR; no murmur  Lungs:  clear to auscultation bilaterally, no wheezing, rhonchi or rales  Abd: soft, nontender, no hepatomegaly  Ext: no edema Musculoskeletal:  No deformities, BUE and BLE strength normal and equal Skin: warm and dry  Neuro:  CNs 2-12 intact, no focal abnormalities noted Psych:  Normal affect   EKG:  The EKG was personally reviewed and  demonstrates:   Telemetry:  Telemetry was personally reviewed and demonstrates:    Relevant CV Studies:   Laboratory Data:  Chemistry Recent Labs  Lab 05/15/23 1509  NA 137  K 4.0  CL 105  CO2 23  GLUCOSE 97  BUN 11  CREATININE 1.02  CALCIUM 9.2  GFRNONAA >60  ANIONGAP 9    Recent Labs  Lab 05/15/23 1509  PROT 6.6  ALBUMIN 4.0  AST 17  ALT 19  ALKPHOS 64  BILITOT 0.2   Hematology Recent Labs  Lab 05/15/23 1509  WBC 9.5  RBC 4.45  HGB 13.6  HCT 39.9  MCV 89.7  MCH 30.6  MCHC 34.1  RDW 12.4  PLT 262   Cardiac EnzymesNo results for input(s): "TROPONINI" in the last 168 hours. No results for input(s): "TROPIPOC" in the last 168 hours.  BNPNo results for input(s): "BNP", "PROBNP" in the last 168 hours.  DDimer  Recent Labs  Lab 05/15/23 1509  DDIMER 0.41    Radiology/Studies:  No results found.  Assessment and Plan:    Chest pain: Ongoing in the last 1 month, 4 episodes so far. Twice occurred during sexual intercourse, once thinking about it, and another getting ready for it. Lasted between 10 and 20 minutes and radiated to his left arm.  The intensity of chest pain decreased after he cut back on the dose of sildenafil from 100 mg to 50 mg yesterday. On chronic sildenafil use. Will stop sildenafil now. Likely drug induced however will need to rule out significant CAD. EKG showed NSR, no ischemia. Hs troponins 26. Trend troponins x 3.  If troponins are significantly elevated, will need to start him on ACS protocol and transfer to  [facility] for LHC. Otherwise, he will need CT cardiac to definitely rule out severe CAD. Obtain echocardiogram.  HTN with an element of whitecoat HTN: Continue lisinopril 40 mg once daily.   For questions or updates, please contact [facility] Please consult [email or web address] for contact info under Cardiology/STEMI.   Signed, Anson Peddie Priya Melenda Bielak, MD 05/15/2023 4:43 PM

## 2023-05-15 NOTE — ED Provider Notes (Signed)
 [place] EMERGENCY DEPARTMENT AT [facility] Provider Note   CSN: [id number] Arrival date & time: 05/15/23  1416     History  Chief Complaint  Patient presents with   Chest Pain    Luis Hardin is a 58 y.o. male.  Patient is a 58 year old male who presents to the emergency department the chief complaint of left-sided chest pain which has been intermittent for approximate the past 3 weeks.  He notes that he has had some events of exertional pain but notes that the pain this morning began at rest while he was asleep.  He notes that the pain is substernal and does radiate to the left arm.  He denies any known history of cardiac disease and denies any family history of MI or CVA.  He notes he does have a history of hypertension and hyperlipidemia.  He denies any history of diabetes or history of smoking.  He denies any lower extremity pain or edema, hemoptysis, recent long travel or surgeries, history of DVT or PE.  He denies any recent cough, congestion, rhinorrhea or sore throat.  The pain is not positional in nature.  He has had no associated shortness of breath.   Chest Pain      Home Medications Prior to Admission medications   Medication Sig Start Date End Date Taking? Authorizing Provider  acetaminophen (TYLENOL) 500 MG tablet Take 1,000 mg by mouth every 6 (six) hours as needed for moderate pain.    [provider]  docusate sodium (COLACE) 100 MG capsule Take 1 capsule (100 mg total) by mouth 2 (two) times daily. 04/21/22   Harrie Foreman, PA-C  lisinopril (PRINIVIL,ZESTRIL) 40 MG tablet Take 40 mg by mouth daily with breakfast.     [provider]  omeprazole (PRILOSEC) 20 MG capsule Take 20 mg by mouth daily.    [provider]  sildenafil (VIAGRA) 100 MG tablet Take 50-100 mg by mouth daily as needed for erectile dysfunction. 02/07/22   [provider]  sodium chloride (OCEAN) 0.65 % SOLN nasal spray Place 1 spray into both nostrils  as needed for congestion.    [provider]  sulfamethoxazole-trimethoprim (BACTRIM DS) 800-160 MG tablet Take 1 tablet by mouth 2 (two) times daily. Start the day prior to foley removal appointment 04/21/22   Harrie Foreman, PA-C  Tetrahydrozoline HCl (VISINE OP) Place 1 drop into both eyes daily as needed (irritation).    [provider]  traMADol (ULTRAM) 50 MG tablet Take 1-2 tablets (50-100 mg total) by mouth every 6 (six) hours as needed for moderate pain or severe pain. 04/21/22   Harrie Foreman, PA-C  triamcinolone cream (KENALOG) 0.1 % Apply 1 Application topically 2 (two) times daily as needed (eczema). 02/28/22   [provider]      Allergies    Patient has no known allergies.    Review of Systems   Review of Systems  Cardiovascular:  Positive for chest pain.  All other systems reviewed and are negative.   Physical Exam Updated Vital Signs BP 127/88 (BP Location: Right Arm)   Pulse 92   Temp 98.7 F (37.1 C) (Oral)   Resp 16   Ht 5\' 9"  (1.753 m)   Wt 96.2 kg   SpO2 97%   BMI 31.31 kg/m  Physical Exam Vitals and nursing note reviewed.  Constitutional:      Appearance: Normal appearance.  HENT:     Head: Normocephalic and atraumatic.     Nose:  Nose normal.     Mouth/Throat:     Mouth: Mucous membranes are moist.  Eyes:     Extraocular Movements: Extraocular movements intact.     Conjunctiva/sclera: Conjunctivae normal.     Pupils: Pupils are equal, round, and reactive to light.  Cardiovascular:     Rate and Rhythm: Normal rate and regular rhythm.     Pulses: Normal pulses.     Heart sounds: Normal heart sounds. Heart sounds not distant. No murmur heard. Pulmonary:     Effort: Pulmonary effort is normal. No tachypnea.     Breath sounds: Normal breath sounds. No stridor. No decreased breath sounds, wheezing, rhonchi or rales.  Chest:     Chest wall: Tenderness present.  Abdominal:     General: Abdomen is flat. Bowel sounds are normal.      Palpations: Abdomen is soft.     Tenderness: There is no abdominal tenderness. There is no guarding.  Musculoskeletal:        General: Normal range of motion.     Cervical back: Normal range of motion and neck supple.     Right lower leg: No edema.     Left lower leg: No edema.  Skin:    General: Skin is warm and dry.  Neurological:     General: No focal deficit present.     Mental Status: He is alert and oriented to person, place, and time. Mental status is at baseline.  Psychiatric:        Mood and Affect: Mood normal.        Behavior: Behavior normal.        Thought Content: Thought content normal.        Judgment: Judgment normal.     ED Results / Procedures / Treatments   Labs (all labs ordered are listed, but only abnormal results are displayed) Labs Reviewed  CBC  BASIC METABOLIC PANEL WITH GFR  HEPATIC FUNCTION PANEL  D-DIMER, QUANTITATIVE  TROPONIN I (HIGH SENSITIVITY)    EKG EKG Interpretation Date/Time:  Monday May 15 2023 14:28:09 EDT Ventricular Rate:  92 PR Interval:  172 QRS Duration:  88 QT Interval:  346 QTC Calculation: 427 R Axis:   52  Text Interpretation: Normal sinus rhythm Low voltage QRS Cannot rule out Anterior infarct (cited on or before 15-May-2023) Abnormal ECG When compared with ECG of 12-Apr-2022 08:24, Questionable change in QRS axis Confirmed by Shyrl Doyne [id number]) on 05/15/2023 3:26:34 PM  Radiology No results found.  Procedures Procedures    Medications Ordered in ED Medications - No data to display  ED Course/ Medical Decision Making/ A&P                                 Medical Decision Making Amount and/or Complexity of Data Reviewed Labs: ordered. Radiology: ordered.  Risk OTC drugs. Decision regarding hospitalization.   This patient presents to the ED for concern of chest pain, this involves an extensive number of treatment options, and is a complaint that carries with it a high risk of complications  and morbidity.  The differential diagnosis includes ACS, pulmonary embolus, pericarditis, myocarditis, endocarditis, pneumonia, pneumothorax, hemothorax, aortic aneurysm or dissection   Co morbidities that complicate the patient evaluation  Hypertension and hyperlipidemia   Additional history obtained:  Additional history obtained from spouse External records from outside source obtained and reviewed including none   Lab Tests:  I Ordered, and personally  interpreted labs.  The pertinent results include: Elevated troponin, normal liver function kidney function, normal electrolytes, no leukocytosis or anemia, negative D-dimer   Imaging Studies ordered:  I ordered imaging studies including chest x-ray I independently visualized and interpreted imaging which showed no acute cardiopulmonary process I agree with the radiologist interpretation   Cardiac Monitoring: / EKG:  The patient was maintained on a cardiac monitor.  I personally viewed and interpreted the cardiac monitored which showed an underlying rhythm of: Normal sinus rhythm, no ST/T wave changes, no ischemic changes, no STEMI, normal axis   Consultations Obtained:  I requested consultation with the cardiology, Dr. Mallipeddi,  and discussed lab and imaging findings as well as pertinent plan - they recommend: Admission for observation   Problem List / ED Course / Critical interventions / Medication management  Patient does remain stable at this time and continues to remain pain-free.  Vital signs are stable with no associated hypertension or tachycardia.  Patient did have an elevated troponin in the emergency department.  Patient case was discussed with cardiology who does recommend admission at this time for observation.  Repeat troponin was flat at this time.  No heparin drip is warranted at this point.  D-dimer was negative and do not suspect pulmonary embolus.  Chest x-ray demonstrated no indication for pneumonia,  pneumothorax, hemothorax.  Do not suspect aortic aneurysm or dissection or associated endocarditis in this patient.  Did discuss patient case with Dr. Debroah Fanning who has accepted for admission. I ordered medication including aspirin for chest pain Reevaluation of the patient after these medicines showed that the patient improved I have reviewed the patients home medicines and have made adjustments as needed   Social Determinants of Health:  None   Test / Admission - Considered:  Admission        Final Clinical Impression(s) / ED Diagnoses Final diagnoses:  None    Rx / DC Orders ED Discharge Orders     None         Roselynn Connors, PA-C 05/15/23 1910    Quinn Bucco, DO 05/17/23 1548

## 2023-05-15 NOTE — Assessment & Plan Note (Signed)
Resume lisinopril

## 2023-05-15 NOTE — ED Triage Notes (Signed)
 Pt c/o chest pain and left arm pain that started off and on for 3 weeks.

## 2023-05-16 ENCOUNTER — Observation Stay (HOSPITAL_BASED_OUTPATIENT_CLINIC_OR_DEPARTMENT_OTHER)

## 2023-05-16 ENCOUNTER — Encounter (HOSPITAL_COMMUNITY): Payer: Self-pay | Admitting: Family Medicine

## 2023-05-16 DIAGNOSIS — R079 Chest pain, unspecified: Secondary | ICD-10-CM | POA: Diagnosis not present

## 2023-05-16 DIAGNOSIS — C61 Malignant neoplasm of prostate: Secondary | ICD-10-CM | POA: Diagnosis not present

## 2023-05-16 DIAGNOSIS — I10 Essential (primary) hypertension: Secondary | ICD-10-CM | POA: Diagnosis not present

## 2023-05-16 LAB — ECHOCARDIOGRAM COMPLETE
AR max vel: 2.63 cm2
AV Area VTI: 2.73 cm2
AV Area mean vel: 2.25 cm2
AV Mean grad: 3 mmHg
AV Peak grad: 5.3 mmHg
Ao pk vel: 1.15 m/s
Area-P 1/2: 3 cm2
Calc EF: 68.7 %
Height: 69 in
MV VTI: 2.42 cm2
S' Lateral: 2.5 cm
Single Plane A2C EF: 68.8 %
Single Plane A4C EF: 69 %
Weight: 3294.55 [oz_av]

## 2023-05-16 LAB — HIV ANTIBODY (ROUTINE TESTING W REFLEX): HIV Screen 4th Generation wRfx: NONREACTIVE

## 2023-05-16 LAB — TROPONIN I (HIGH SENSITIVITY): Troponin I (High Sensitivity): 8 ng/L (ref <18)

## 2023-05-16 MED ORDER — PERFLUTREN LIPID MICROSPHERE
1.0000 mL | INTRAVENOUS | Status: AC | PRN
  Administered 2023-05-16: 3 mL via INTRAVENOUS

## 2023-05-16 NOTE — Progress Notes (Signed)
 Rounding Note    Patient Name: Kermitt Harjo Date of Encounter: 05/16/2023  [facility] Cardiologist: Marjo Bicker, MD   Subjective   Denies any recurrent chest pain overnight or this morning. Breathing at baseline. No orthopnea or PND.   Inpatient Medications    Scheduled Meds:  aspirin EC  81 mg Oral Daily   enoxaparin (LOVENOX) injection  40 mg Subcutaneous Q24H   lisinopril  40 mg Oral Q breakfast   pantoprazole  40 mg Oral Daily   Continuous Infusions:  PRN Meds: acetaminophen **OR** acetaminophen, ondansetron **OR** ondansetron (ZOFRAN) IV, polyethylene glycol   Vital Signs    Vitals:   05/16/23 0500 05/16/23 0600 05/16/23 0700 05/16/23 0800  BP: 113/78 124/84 126/87 133/81  Pulse: 78 70 68 74  Resp: 20 20 14 17   Temp:      TempSrc:      SpO2: 99%  99% 97%  Weight:      Height:       No intake or output data in the 24 hours ending 05/16/23 0830    05/15/2023    2:33 PM 04/21/2022    5:46 AM 04/12/2022    8:01 AM  Last 3 Weights  Weight (lbs) 212 lb 211 lb 10.3 oz 212 lb  Weight (kg) 96.163 kg 96 kg 96.163 kg      Telemetry    Not currently connected to telemetry.   Physical Exam   GEN: Pleasant male appearing in no acute distress.   Neck: No JVD Cardiac: RRR, no murmurs, rubs, or gallops.  Respiratory: Clear to auscultation bilaterally. GI: Soft, nontender, non-distended  MS: No pitting edema; No deformity. Neuro:  Nonfocal  Psych: Normal affect   Labs    High Sensitivity Troponin:   Recent Labs  Lab 05/15/23 1509 05/15/23 1711 05/16/23 0628  TROPONINIHS 26* 21* 8     Chemistry Recent Labs  Lab 05/15/23 1509  NA 137  K 4.0  CL 105  CO2 23  GLUCOSE 97  BUN 11  CREATININE 1.02  CALCIUM 9.2  PROT 6.6  ALBUMIN 4.0  AST 17  ALT 19  ALKPHOS 64  BILITOT 0.2  GFRNONAA >60  ANIONGAP 9    Lipids No results for input(s): "CHOL", "TRIG", "HDL", "LABVLDL", "LDLCALC", "CHOLHDL" in the last 168 hours.   Hematology Recent Labs  Lab 05/15/23 1509  WBC 9.5  RBC 4.45  HGB 13.6  HCT 39.9  MCV 89.7  MCH 30.6  MCHC 34.1  RDW 12.4  PLT 262   Thyroid No results for input(s): "TSH", "FREET4" in the last 168 hours.  BNPNo results for input(s): "BNP", "PROBNP" in the last 168 hours.  DDimer  Recent Labs  Lab 05/15/23 1509  DDIMER 0.41     Radiology    DG Chest 2 View Result Date: 05/15/2023 CLINICAL DATA:  Chest pain, left arm pain EXAM: CHEST - 2 VIEW COMPARISON:  02/08/2013 FINDINGS: The heart size and mediastinal contours are within normal limits. Both lungs are clear. The visualized skeletal structures are unremarkable. IMPRESSION: No active cardiopulmonary disease. Electronically Signed   By: Charlett Nose M.D.   On: 05/15/2023 18:12    Cardiac Studies   Echocardiogram: Pending  Patient Profile     58 y.o. male w/ PMH of HTN, HLD and prostate cancer (s/p prostatectomy in 2024) who is currently admitted for evaluation of chest pain.   Assessment & Plan    1. Chest Pain - Reported 4 episodes of chest  pain within the past month and in the setting of sexual intercourse and Sildenafil use. No recurrence since admission.  - Hs Troponin values have been flat at 26, 21 and 8. EKG with no acute ST changes. Echocardiogram pending for later today. Will check an FLP for risk-stratification.  - Reviewed options with the patient in regards to outpatient Coronary CTA vs. inpatient Coronary CTA and he prefers to have it this admission. Awaiting a bed at [facility] so might not be until tomorrow. Will enter a diet order and he will need to be NPO for 1 hour prior to the scan once this is arranged. Will also need to order Lopressor for HR control prior to the scan once transfer is arranged and we have confirmed timing of his Coronary CTA. If echo is abnormal, would do LHC instead. I have reached out to the echo tech covering at [facility] today and have asked them to get his echo as soon as possible.   2.  HTN - BP has overall been well-controlled, at 133/81 on most recent check. Remains on Lisinopril 40mg  daily.    For questions or updates, please contact [facility] Please consult [email or web address] for contact info under        Signed, Dorma Gash, PA-C  05/16/2023, 8:30 AM

## 2023-05-16 NOTE — TOC CM/SW Note (Signed)
 Transition of Care [facility]) - Inpatient Brief Assessment   Patient Details  Name: Luis Hardin MRN: [id number] Date of Birth: 02/15/1965  [facility]) CM/SW Contact:    Grandville Lax, LCSWA Phone Number: 05/16/2023, 10:15 AM   Clinical Narrative: Transition of Care Department [facility]) has reviewed patient and no TOC needs have been identified at this time. We will continue to monitor patient advancement through interdiciplinary progression rounds. If new patient transition needs arise, please place a TOC consult.   Transition of Care Asessment: Insurance and Status: Insurance coverage has been reviewed Patient has primary care physician: Yes Home environment has been reviewed: from home Prior level of function:: independent Prior/Current Home Services: No current home services Social Drivers of Health Review: SDOH reviewed no interventions necessary Readmission risk has been reviewed: Yes Transition of care needs: no transition of care needs at this time

## 2023-05-16 NOTE — Progress Notes (Signed)
  Echocardiogram 2D Echocardiogram has been performed.  Zareen Jamison L Grayton Lobo RDCS 05/16/2023, 11:37 AM

## 2023-05-16 NOTE — Plan of Care (Signed)

## 2023-05-16 NOTE — Hospital Course (Signed)
 57 y.o. male with medical history significant for prostate cancer, hypertension.  Patient presented to the ED with complaint of chest pain over the past month, he reports 4 episodes of this.  This chest pains have occurred with sexual activity or related to or thinking about sexual activity, when he took Viagra which was prescribed for his prostate,  Chest pain is mid chest, radiates down his left arm.  He reduced the dose of Viagra from 100 to 50 mg and noticed reduced intensity of his chest pain.  This pain lasted about 10 to 20 minutes.  No prior cardiac disease.  Prior to the past month he has never had chest pains or dyspnea with exertion or at rest.  Cardiology was consulted and recommending that he be transferred to [facility] for CT cardiac study.

## 2023-05-16 NOTE — Progress Notes (Signed)
 PROGRESS NOTE   Luis Hardin  ZOX:[id number] DOB: 11-Feb-1965 DOA: 05/15/2023 PCP: Alvina Filbert, MD   Chief Complaint  Patient presents with   Chest Pain   Level of care: Telemetry Cardiac  Brief Admission History:  58 y.o. male with medical history significant for prostate cancer, hypertension.  Patient presented to the ED with complaint of chest pain over the past month, he reports 4 episodes of this.  This chest pains have occurred with sexual activity or related to or thinking about sexual activity, when he took Viagra which was prescribed for his prostate,  Chest pain is mid chest, radiates down his left arm.  He reduced the dose of Viagra from 100 to 50 mg and noticed reduced intensity of his chest pain.  This pain lasted about 10 to 20 minutes.  No prior cardiac disease.  Prior to the past month he has never had chest pains or dyspnea with exertion or at rest.  Cardiology was consulted and recommending that he be transferred to [facility] for CT cardiac study.    Assessment and Plan:  Chest pain Chest pain while on Viagra, related to sexual activities.  Troponin 26 > 21.  EKG without acute changes.  D-dimer 0.41.  Chest x-ray clear.  HS troponins trended down and reassuring of no ACS.  - Evaluated by cardiology- Dr. Jenene Slicker, recommending TTE and transfer to [facility] for CT cardiac study; if he gets a bed today he could have study done today otherwise plan for study tomorrow, he will need to be NPO 1 hour prior to the study.   - Follow up 2D echocardiogram -Aspirin 81 mg daily started on admission; likely DC if CT study negative  HTN (hypertension) Resume lisinopril 40 mg daily   Prostate cancer  ED  Hold sildenafil medication Follow up with urologist   DVT prophylaxis: enoxaparin  Code Status: Full  Family Communication:  Disposition: transfer to [facility] for [facility] cardiac study   Consultants:  Cardiology [facility]   Procedures:   Antimicrobials:    Subjective: Pt says that he has  not had any further chest pain symptoms since arrival.  He denies SOB.  He denies palpitations.   Objective: Vitals:   05/16/23 0700 05/16/23 0800 05/16/23 0830 05/16/23 1242  BP: 126/87 133/81 130/68 128/82  Pulse: 68 74 67 78  Resp: 14 17 20 20   Temp:   (!) 97.5 F (36.4 C) 97.9 F (36.6 C)  TempSrc:   Oral Oral  SpO2: 99% 97% 100% 98%  Weight:   93.4 kg   Height:   5\' 9"  (1.753 m)     Intake/Output Summary (Last 24 hours) at 05/16/2023 1338 Last data filed at 05/16/2023 1241 Gross per 24 hour  Intake 240 ml  Output --  Net 240 ml   Filed Weights   05/15/23 1433 05/16/23 0830  Weight: 96.2 kg 93.4 kg   Examination:  General exam: Appears calm and comfortable  Respiratory system: Clear to auscultation. Respiratory effort normal. Cardiovascular system: normal S1 & S2 heard. No JVD, murmurs, rubs, gallops or clicks. No pedal edema. Gastrointestinal system: Abdomen is nondistended, soft and nontender. No organomegaly or masses felt. Normal bowel sounds heard. Central nervous system: Alert and oriented. No focal neurological deficits. Extremities: Symmetric 5 x 5 power. Skin: No rashes, lesions or ulcers. Psychiatry: Judgement and insight appear normal. Mood & affect appropriate.   Data Reviewed: I have personally reviewed following labs and imaging studies  CBC: Recent Labs  Lab 05/15/23 1509  WBC 9.5  HGB 13.6  HCT 39.9  MCV 89.7  PLT 262    Basic Metabolic Panel: Recent Labs  Lab 05/15/23 1509  NA 137  K 4.0  CL 105  CO2 23  GLUCOSE 97  BUN 11  CREATININE 1.02  CALCIUM 9.2    CBG: No results for input(s): "GLUCAP" in the last 168 hours.  No results found for this or any previous visit (from the past 240 hours).   Radiology Studies: DG Chest 2 View Result Date: 05/15/2023 CLINICAL DATA:  Chest pain, left arm pain EXAM: CHEST - 2 VIEW COMPARISON:  02/08/2013 FINDINGS: The heart size and mediastinal contours are within normal limits. Both lungs are  clear. The visualized skeletal structures are unremarkable. IMPRESSION: No active cardiopulmonary disease. Electronically Signed   By: Janeece Mechanic M.D.   On: 05/15/2023 18:12    Scheduled Meds:  aspirin EC  81 mg Oral Daily   enoxaparin (LOVENOX) injection  40 mg Subcutaneous Q24H   lisinopril  40 mg Oral Q breakfast   pantoprazole  40 mg Oral Daily   Continuous Infusions:   LOS: 0 days   Time spent: 52 mins  Holdyn Poyser Lincoln Renshaw, MD How to contact the [facility] Attending or Consulting provider 7A - 7P or covering provider during after hours 7P -7A, for this patient?  Check the care team in [facility] and look for a) attending/consulting TRH provider listed and b) the TRH team listed Log into [email or web address] to find provider on call.  Locate the TRH provider you are looking for under [facility] and page to a number that you can be directly reached. If you still have difficulty reaching the provider, please page the [facility] (Director on Call) for the Hospitalists listed on amion for assistance.  05/16/2023, 1:38 PM

## 2023-05-17 DIAGNOSIS — R079 Chest pain, unspecified: Secondary | ICD-10-CM | POA: Diagnosis not present

## 2023-05-17 DIAGNOSIS — I10 Essential (primary) hypertension: Secondary | ICD-10-CM | POA: Diagnosis not present

## 2023-05-17 DIAGNOSIS — I517 Cardiomegaly: Secondary | ICD-10-CM | POA: Diagnosis not present

## 2023-05-17 DIAGNOSIS — R7989 Other specified abnormal findings of blood chemistry: Secondary | ICD-10-CM

## 2023-05-17 DIAGNOSIS — T887XXA Unspecified adverse effect of drug or medicament, initial encounter: Secondary | ICD-10-CM

## 2023-05-17 DIAGNOSIS — R0789 Other chest pain: Secondary | ICD-10-CM | POA: Diagnosis not present

## 2023-05-17 DIAGNOSIS — C61 Malignant neoplasm of prostate: Secondary | ICD-10-CM | POA: Diagnosis not present

## 2023-05-17 LAB — LIPID PANEL
Cholesterol: 151 mg/dL (ref 0–200)
HDL: 30 mg/dL — ABNORMAL LOW (ref >40)
LDL Cholesterol: 90 mg/dL (ref 0–99)
Total CHOL/HDL Ratio: 5 ratio
Triglycerides: 154 mg/dL — ABNORMAL HIGH (ref <150)
VLDL: 31 mg/dL (ref 0–40)

## 2023-05-17 MED ORDER — LISINOPRIL 10 MG PO TABS
10.0000 mg | ORAL_TABLET | Freq: Every day | ORAL | Status: DC
  Administered 2023-05-17 – 2023-05-18 (×2): 10 mg via ORAL
  Filled 2023-05-17 (×2): qty 1

## 2023-05-17 NOTE — Plan of Care (Signed)

## 2023-05-17 NOTE — Progress Notes (Signed)
 PROGRESS NOTE  Luis Hardin GEX:[id number] DOB: 01-Aug-1965 DOA: 05/15/2023 PCP: Alvina Filbert, MD  Brief History:  58 y.o. male with medical history significant for prostate cancer, hypertension.  Patient presented to the ED with complaint of chest pain over the past month, he reports 4 episodes of this.  This chest pains have occurred with sexual activity or related to or thinking about sexual activity, when he took Viagra which was prescribed for his prostate,  Chest pain is mid chest, radiates down his left arm.  He reduced the dose of Viagra from 100 to 50 mg and noticed reduced intensity of his chest pain.  This pain lasted about 10 to 20 minutes.  No prior cardiac disease.  Prior to the past month he has never had chest pains or dyspnea with exertion or at rest.  Cardiology was consulted and recommending that he be transferred to [facility] for CT cardiac study.    Assessment/Plan: Chest pain -Chest pain while on Viagra, related to sexual activities.   -Troponin 26 > 21>>8  EKG without acute changes.   -D-dimer 0.41.  Chest x-ray clear.  - Evaluated by cardiology- Dr. Jenene Slicker, recommending TTE and transfer to [facility] for CT cardiac study; he will need to be NPO 1 hour prior to the study.   - 05/16/23 Echo--EF 60-65%, G1DD, no WMA, normal RVF -Aspirin 81 mg daily started on admission; likely DC if CT study negative   HTN (hypertension) -Decrease lisinopril 40 mg daily  to 10 mg due to soft BPs   Prostate cancer  ED  -Hold sildenafil  Follow up with urologist        Family Communication:   Family at bedside  Consultants:    Code Status:  FULL / DNR  DVT Prophylaxis:  [place] Heparin / [place] Lovenox   Procedures: As Listed in Progress Note Above  Antibiotics: None       Subjective: Patient denies fevers, chills, headache, chest pain, dyspnea, nausea, vomiting, diarrhea, abdominal pain, dysuria, hematuria, hematochezia, and melena. He walked a few laps around nurses  station without cp  Objective: Vitals:   05/16/23 1557 05/16/23 2010 05/17/23 0047 05/17/23 0438  BP: 123/87 104/65 104/67 107/74  Pulse: 76 75 78 69  Resp: 20 20 20 20   Temp: 98.5 F (36.9 C) 98 F (36.7 C) 97.8 F (36.6 C) 97.6 F (36.4 C)  TempSrc: Oral Oral Oral Oral  SpO2: 97% 97% 96% 95%  Weight:      Height:        Intake/Output Summary (Last 24 hours) at 05/17/2023 0102 Last data filed at 05/16/2023 1241 Gross per 24 hour  Intake 240 ml  Output --  Net 240 ml   Weight change: -2.763 kg Exam:  General:  Pt is alert, follows commands appropriately, not in acute distress HEENT: No icterus, No thrush, No neck mass, [place]/AT Cardiovascular: RRR, S1/S2, no rubs, no gallops Respiratory: CTA bilaterally, no wheezing, no crackles, no rhonchi Abdomen: Soft/+BS, non tender, non distended, no guarding Extremities: No edema, No lymphangitis, No petechiae, No rashes, no synovitis   Data Reviewed: I have personally reviewed following labs and imaging studies Basic Metabolic Panel: Recent Labs  Lab 05/15/23 1509  NA 137  K 4.0  CL 105  CO2 23  GLUCOSE 97  BUN 11  CREATININE 1.02  CALCIUM 9.2   Liver Function Tests: Recent Labs  Lab 05/15/23 1509  AST 17  ALT 19  ALKPHOS  64  BILITOT 0.2  PROT 6.6  ALBUMIN 4.0   No results for input(s): "LIPASE", "AMYLASE" in the last 168 hours. No results for input(s): "AMMONIA" in the last 168 hours. Coagulation Profile: No results for input(s): "INR", "PROTIME" in the last 168 hours. CBC: Recent Labs  Lab 05/15/23 1509  WBC 9.5  HGB 13.6  HCT 39.9  MCV 89.7  PLT 262   Cardiac Enzymes: No results for input(s): "CKTOTAL", "CKMB", "CKMBINDEX", "TROPONINI" in the last 168 hours. BNP: Invalid input(s): "POCBNP" CBG: No results for input(s): "GLUCAP" in the last 168 hours. HbA1C: No results for input(s): "HGBA1C" in the last 72 hours. Urine analysis:    Component Value Date/Time   APPEARANCEUR Clear 02/22/2022  1303   GLUCOSEU Negative 02/22/2022 1303   BILIRUBINUR Negative 02/22/2022 1303   PROTEINUR Negative 02/22/2022 1303   NITRITE Negative 02/22/2022 1303   LEUKOCYTESUR Negative 02/22/2022 1303   Sepsis Labs: @LABRCNTIP (procalcitonin:4,lacticidven:4) )No results found for this or any previous visit (from the past 240 hours).   Scheduled Meds:  aspirin EC  81 mg Oral Daily   enoxaparin (LOVENOX) injection  40 mg Subcutaneous Q24H   lisinopril  40 mg Oral Q breakfast   pantoprazole  40 mg Oral Daily   Continuous Infusions:  Procedures/Studies: ECHOCARDIOGRAM COMPLETE Result Date: 05/16/2023    ECHOCARDIOGRAM REPORT   Patient Name:   Luis Hardin Date of Exam: 05/16/2023 Medical Rec #:  [id number]    Height:       69.0 in Accession #:    [id number]   Weight:       205.9 lb Date of Birth:  [birth year]    BSA:          2.092 m Patient Age:    58 years     BP:           130/68 mmHg Patient Gender: M            HR:           68 bpm. [facility] Procedure: 2D Echo, Cardiac Doppler, Color Doppler and Intracardiac            Opacification Agent (Both Spectral and Color Flow Doppler were            utilized during procedure). Indications:    Chest pain  History:        Patient has no prior history of Echocardiogram examinations.                 Risk Factors:Former Smoker and Hypertension.  Sonographer:    Vern Claude Referring Phys: [id number] Heloise Beecham EMOKPAE IMPRESSIONS  1. No LV thrombus by Definity. Left ventricular ejection fraction, by estimation, is 60 to 65%. The left ventricle has normal function. The left ventricle has no regional wall motion abnormalities. Left ventricular diastolic parameters are consistent with Grade I diastolic dysfunction (impaired relaxation).  2. Right ventricular systolic function is normal. The right ventricular size is mild to moderately enlarged. Tricuspid regurgitation signal is inadequate for assessing PA pressure.  3. The mitral valve is grossly normal.  Trivial mitral valve regurgitation. No evidence of mitral stenosis.  4. The aortic valve has an indeterminant number of cusps. Aortic valve regurgitation is not visualized. No aortic stenosis is present. Comparison(s): No prior Echocardiogram. FINDINGS  Left Ventricle: No LV thrombus by Definity. Left ventricular ejection fraction, by estimation, is 60 to 65%. The left ventricle has normal function. The left ventricle has no regional wall  motion abnormalities. Strain was performed and the global longitudinal strain is indeterminate. The left ventricular internal cavity size was normal in size. There is no left ventricular hypertrophy. Left ventricular diastolic parameters are consistent with Grade I diastolic dysfunction (impaired relaxation). Normal left ventricular filling pressure. Right Ventricle: The right ventricular size is mild to moderately enlarged. No increase in right ventricular wall thickness. Right ventricular systolic function is normal. Tricuspid regurgitation signal is inadequate for assessing PA pressure. Left Atrium: Left atrial size was normal in size. Right Atrium: Right atrial size was normal in size. Pericardium: There is no evidence of pericardial effusion. Mitral Valve: The mitral valve is grossly normal. Trivial mitral valve regurgitation. No evidence of mitral valve stenosis. MV peak gradient, 2.0 mmHg. The mean mitral valve gradient is 1.0 mmHg. Tricuspid Valve: The tricuspid valve is not well visualized. Tricuspid valve regurgitation is not demonstrated. No evidence of tricuspid stenosis. Aortic Valve: The aortic valve has an indeterminant number of cusps. Aortic valve regurgitation is not visualized. No aortic stenosis is present. Aortic valve mean gradient measures 3.0 mmHg. Aortic valve peak gradient measures 5.3 mmHg. Aortic valve area, by VTI measures 2.73 cm. Pulmonic Valve: The pulmonic valve was not well visualized. Pulmonic valve regurgitation is not visualized. No evidence of  pulmonic stenosis. Aorta: The aortic root and ascending aorta are structurally normal, with no evidence of dilitation. Venous: The inferior vena cava was not well visualized. IAS/Shunts: The interatrial septum was not well visualized. Additional Comments: 3D was performed not requiring image post processing on an independent workstation and was indeterminate.  LEFT VENTRICLE PLAX 2D LVIDd:         4.70 cm      Diastology LVIDs:         2.50 cm      LV e' medial:    8.81 cm/s LV PW:         1.20 cm      LV E/e' medial:  8.2 LV IVS:        1.10 cm      LV e' lateral:   12.70 cm/s LVOT diam:     2.20 cm      LV E/e' lateral: 5.7 LV SV:         53 LV SV Index:   25 LVOT Area:     3.80 cm  LV Volumes (MOD) LV vol d, MOD A2C: 149.0 ml LV vol d, MOD A4C: 182.5 ml LV vol s, MOD A2C: 46.5 ml LV vol s, MOD A4C: 56.6 ml LV SV MOD A2C:     102.5 ml LV SV MOD A4C:     182.5 ml LV SV MOD BP:      114.5 ml RIGHT VENTRICLE             IVC RV Basal diam:  4.30 cm     IVC diam: 0.80 cm RV Mid diam:    3.00 cm RV S prime:     12.60 cm/s TAPSE (M-mode): 3.1 cm LEFT ATRIUM             Index        RIGHT ATRIUM           Index LA diam:        3.90 cm 1.86 cm/m   RA Area:     10.20 cm LA Vol (A2C):   40.9 ml 19.55 ml/m  RA Volume:   16.00 ml  7.65 ml/m LA Vol (A4C):   42.5 ml  20.32 ml/m LA Biplane Vol: 41.7 ml 19.94 ml/m  AORTIC VALVE                    PULMONIC VALVE AV Area (Vmax):    2.63 cm     PV Vmax:       0.74 m/s AV Area (Vmean):   2.25 cm     PV Peak grad:  2.2 mmHg AV Area (VTI):     2.73 cm AV Vmax:           115.00 cm/s AV Vmean:          83.800 cm/s AV VTI:            0.195 m AV Peak Grad:      5.3 mmHg AV Mean Grad:      3.0 mmHg LVOT Vmax:         79.60 cm/s LVOT Vmean:        49.700 cm/s LVOT VTI:          0.140 m LVOT/AV VTI ratio: 0.72  AORTA Ao Root diam: 3.40 cm Ao Asc diam:  3.00 cm MITRAL VALVE MV Area (PHT): 3.00 cm    SHUNTS MV Area VTI:   2.42 cm    Systemic VTI:  0.14 m MV Peak grad:  2.0 mmHg     Systemic Diam: 2.20 cm MV Mean grad:  1.0 mmHg MV Vmax:       0.71 m/s MV Vmean:      47.4 cm/s MV Decel Time: 253 msec MV E velocity: 72.00 cm/s MV A velocity: 66.80 cm/s MV E/A ratio:  1.08 Vishnu Priya Mallipeddi Electronically signed by Lucetta Russel Mallipeddi Signature Date/Time: 05/16/2023/4:06:10 PM    Final    DG Chest 2 View Result Date: 05/15/2023 CLINICAL DATA:  Chest pain, left arm pain EXAM: CHEST - 2 VIEW COMPARISON:  02/08/2013 FINDINGS: The heart size and mediastinal contours are within normal limits. Both lungs are clear. The visualized skeletal structures are unremarkable. IMPRESSION: No active cardiopulmonary disease. Electronically Signed   By: Janeece Mechanic M.D.   On: 05/15/2023 18:12    Demaris Fillers, DO  [facility]  If 7PM-7AM, please contact night-coverage [email or web address] Password [email or web address] 05/17/2023, 6:42 AM   LOS: 0 days

## 2023-05-17 NOTE — Progress Notes (Signed)
 Progress Note  Patient Name: Luis Hardin Date of Encounter: 05/17/2023  Primary Cardiologist: Marjo Bicker, MD  Subjective   No chest pain in the last 24 hours.  No issues.  Inpatient Medications    Scheduled Meds:  aspirin EC  81 mg Oral Daily   enoxaparin (LOVENOX) injection  40 mg Subcutaneous Q24H   lisinopril  10 mg Oral Q breakfast   pantoprazole  40 mg Oral Daily   Continuous Infusions:  PRN Meds: acetaminophen **OR** acetaminophen, ondansetron **OR** ondansetron (ZOFRAN) IV, polyethylene glycol   Vital Signs    Vitals:   05/16/23 2010 05/17/23 0047 05/17/23 0438 05/17/23 0830  BP: 104/65 104/67 107/74 115/81  Pulse: 75 78 69   Resp: 20 20 20    Temp: 98 F (36.7 C) 97.8 F (36.6 C) 97.6 F (36.4 C)   TempSrc: Oral Oral Oral   SpO2: 97% 96% 95%   Weight:      Height:        Intake/Output Summary (Last 24 hours) at 05/17/2023 1016 Last data filed at 05/17/2023 0842 Gross per 24 hour  Intake 480 ml  Output --  Net 480 ml   Filed Weights   05/15/23 1433 05/16/23 0830  Weight: 96.2 kg 93.4 kg    Telemetry     Personally reviewed.  NSR.  ECG    Not performed today.  Physical Exam   GEN: No acute distress.   Neck: No JVD. Cardiac: RRR, no murmur, rub, or gallop.  Respiratory: Nonlabored. Clear to auscultation bilaterally. GI: Soft, nontender, bowel sounds present. MS: No edema; No deformity. Neuro:  Nonfocal. Psych: Alert and oriented x 3. Normal affect.  Labs    Chemistry Recent Labs  Lab 05/15/23 1509  NA 137  K 4.0  CL 105  CO2 23  GLUCOSE 97  BUN 11  CREATININE 1.02  CALCIUM 9.2  PROT 6.6  ALBUMIN 4.0  AST 17  ALT 19  ALKPHOS 64  BILITOT 0.2  GFRNONAA >60  ANIONGAP 9     Hematology Recent Labs  Lab 05/15/23 1509  WBC 9.5  RBC 4.45  HGB 13.6  HCT 39.9  MCV 89.7  MCH 30.6  MCHC 34.1  RDW 12.4  PLT 262    Cardiac Enzymes Recent Labs  Lab 05/15/23 1509 05/15/23 1711 05/16/23 0628   TROPONINIHS 26* 21* 8    BNPNo results for input(s): "BNP", "PROBNP" in the last 168 hours.   DDimer Recent Labs  Lab 05/15/23 1509  DDIMER 0.41     Radiology    ECHOCARDIOGRAM COMPLETE Result Date: 05/16/2023    ECHOCARDIOGRAM REPORT   Patient Name:   MONTAVIS SCHUBRING Date of Exam: 05/16/2023 Medical Rec #:  [id number]    Height:       69.0 in Accession #:    [id number]   Weight:       205.9 lb Date of Birth:  06/11/1965    BSA:          2.092 m Patient Age:    58 years     BP:           130/68 mmHg Patient Gender: M            HR:           68 bpm. [facility] Procedure: 2D Echo, Cardiac Doppler, Color Doppler and Intracardiac            Opacification Agent (Both Spectral and Color Flow Doppler  were            utilized during procedure). Indications:    Chest pain  History:        Patient has no prior history of Echocardiogram examinations.                 Risk Factors:Former Smoker and Hypertension.  Sonographer:    Juanita Shaw Referring Phys: [id number] EJIROGHENE E EMOKPAE IMPRESSIONS  1. No LV thrombus by Definity. Left ventricular ejection fraction, by estimation, is 60 to 65%. The left ventricle has normal function. The left ventricle has no regional wall motion abnormalities. Left ventricular diastolic parameters are consistent with Grade I diastolic dysfunction (impaired relaxation).  2. Right ventricular systolic function is normal. The right ventricular size is mild to moderately enlarged. Tricuspid regurgitation signal is inadequate for assessing PA pressure.  3. The mitral valve is grossly normal. Trivial mitral valve regurgitation. No evidence of mitral stenosis.  4. The aortic valve has an indeterminant number of cusps. Aortic valve regurgitation is not visualized. No aortic stenosis is present. Comparison(s): No prior Echocardiogram. FINDINGS  Left Ventricle: No LV thrombus by Definity. Left ventricular ejection fraction, by estimation, is 60 to 65%. The left ventricle has  normal function. The left ventricle has no regional wall motion abnormalities. Strain was performed and the global longitudinal strain is indeterminate. The left ventricular internal cavity size was normal in size. There is no left ventricular hypertrophy. Left ventricular diastolic parameters are consistent with Grade I diastolic dysfunction (impaired relaxation). Normal left ventricular filling pressure. Right Ventricle: The right ventricular size is mild to moderately enlarged. No increase in right ventricular wall thickness. Right ventricular systolic function is normal. Tricuspid regurgitation signal is inadequate for assessing PA pressure. Left Atrium: Left atrial size was normal in size. Right Atrium: Right atrial size was normal in size. Pericardium: There is no evidence of pericardial effusion. Mitral Valve: The mitral valve is grossly normal. Trivial mitral valve regurgitation. No evidence of mitral valve stenosis. MV peak gradient, 2.0 mmHg. The mean mitral valve gradient is 1.0 mmHg. Tricuspid Valve: The tricuspid valve is not well visualized. Tricuspid valve regurgitation is not demonstrated. No evidence of tricuspid stenosis. Aortic Valve: The aortic valve has an indeterminant number of cusps. Aortic valve regurgitation is not visualized. No aortic stenosis is present. Aortic valve mean gradient measures 3.0 mmHg. Aortic valve peak gradient measures 5.3 mmHg. Aortic valve area, by VTI measures 2.73 cm. Pulmonic Valve: The pulmonic valve was not well visualized. Pulmonic valve regurgitation is not visualized. No evidence of pulmonic stenosis. Aorta: The aortic root and ascending aorta are structurally normal, with no evidence of dilitation. Venous: The inferior vena cava was not well visualized. IAS/Shunts: The interatrial septum was not well visualized. Additional Comments: 3D was performed not requiring image post processing on an independent workstation and was indeterminate.  LEFT VENTRICLE PLAX 2D  LVIDd:         4.70 cm      Diastology LVIDs:         2.50 cm      LV e' medial:    8.81 cm/s LV PW:         1.20 cm      LV E/e' medial:  8.2 LV IVS:        1.10 cm      LV e' lateral:   12.70 cm/s LVOT diam:     2.20 cm      LV E/e' lateral: 5.7 LV SV:  53 LV SV Index:   25 LVOT Area:     3.80 cm  LV Volumes (MOD) LV vol d, MOD A2C: 149.0 ml LV vol d, MOD A4C: 182.5 ml LV vol s, MOD A2C: 46.5 ml LV vol s, MOD A4C: 56.6 ml LV SV MOD A2C:     102.5 ml LV SV MOD A4C:     182.5 ml LV SV MOD BP:      114.5 ml RIGHT VENTRICLE             IVC RV Basal diam:  4.30 cm     IVC diam: 0.80 cm RV Mid diam:    3.00 cm RV S prime:     12.60 cm/s TAPSE (M-mode): 3.1 cm LEFT ATRIUM             Index        RIGHT ATRIUM           Index LA diam:        3.90 cm 1.86 cm/m   RA Area:     10.20 cm LA Vol (A2C):   40.9 ml 19.55 ml/m  RA Volume:   16.00 ml  7.65 ml/m LA Vol (A4C):   42.5 ml 20.32 ml/m LA Biplane Vol: 41.7 ml 19.94 ml/m  AORTIC VALVE                    PULMONIC VALVE AV Area (Vmax):    2.63 cm     PV Vmax:       0.74 m/s AV Area (Vmean):   2.25 cm     PV Peak grad:  2.2 mmHg AV Area (VTI):     2.73 cm AV Vmax:           115.00 cm/s AV Vmean:          83.800 cm/s AV VTI:            0.195 m AV Peak Grad:      5.3 mmHg AV Mean Grad:      3.0 mmHg LVOT Vmax:         79.60 cm/s LVOT Vmean:        49.700 cm/s LVOT VTI:          0.140 m LVOT/AV VTI ratio: 0.72  AORTA Ao Root diam: 3.40 cm Ao Asc diam:  3.00 cm MITRAL VALVE MV Area (PHT): 3.00 cm    SHUNTS MV Area VTI:   2.42 cm    Systemic VTI:  0.14 m MV Peak grad:  2.0 mmHg    Systemic Diam: 2.20 cm MV Mean grad:  1.0 mmHg MV Vmax:       0.71 m/s MV Vmean:      47.4 cm/s MV Decel Time: 253 msec MV E velocity: 72.00 cm/s MV A velocity: 66.80 cm/s MV E/A ratio:  1.08 Ahnaf Caponi Priya Jaskirat Zertuche Electronically signed by Lucetta Russel Sher Hellinger Signature Date/Time: 05/16/2023/4:06:10 PM    Final    DG Chest 2 View Result Date: 05/15/2023 CLINICAL DATA:  Chest pain,  left arm pain EXAM: CHEST - 2 VIEW COMPARISON:  02/08/2013 FINDINGS: The heart size and mediastinal contours are within normal limits. Both lungs are clear. The visualized skeletal structures are unremarkable. IMPRESSION: No active cardiopulmonary disease. Electronically Signed   By: Janeece Mechanic M.D.   On: 05/15/2023 18:12    Assessment & Plan   Chest pain: Occurred prior to, during and after sexual intercourse in the last 1 month, 4  episodes so far. Lasted between 10 minutes and 20 minutes. Radiated to left arm. No chest pain after arrival to the ER. No prior MI/PCI/CABG or ischemia evaluation. On chronic sildenafil use.  Stop sildenafil. Likely drug induced, but will need to r/o CAD. Hs troponins downtrending, 26>> 21>> 8.  EKG showed NSR, no ischemia.  Not ACS.  He will need to be transferred to [facility] for CT cardiac to definitely rule out severe CAD.  If CT cardiac is reassuring, likely chest pain is sildenafil induced and he will need to be follow-up with his urologist for alternative medications/options.  Echocardiogram yesterday showed normal LVEF, normal RV function, mild to moderate RV enlargement, no valvular heart disease.  Mild to moderate RV enlargement: Will obtain a limited echocardiogram in the outpatient setting.   HTN, controlled: Continue lisinopril 40 mg once daily.    If patient is not transferred to [facility] by this afternoon, it is not unreasonable to discharge him and schedule outpatient CT cardiac.  We will also schedule him in the cardiology clinic in the next few weeks after his CT is resulted.  Patient is agreeable to the above plan.   Signed, Lasalle Pointer, MD  05/17/2023, 10:16 AM

## 2023-05-17 NOTE — Plan of Care (Signed)
  Problem: Clinical Measurements: Goal: Ability to maintain clinical measurements within normal limits will improve Outcome: Progressing Goal: Will remain free from infection Outcome: Progressing Goal: Diagnostic test results will improve Outcome: Progressing Goal: Respiratory complications will improve Outcome: Progressing Goal: Cardiovascular complication will be avoided Outcome: Progressing   Problem: Coping: Goal: Level of anxiety will decrease Outcome: Progressing   Problem: Elimination: Goal: Will not experience complications related to bowel motility Outcome: Progressing Goal: Will not experience complications related to urinary retention Outcome: Progressing   Problem: Skin Integrity: Goal: Risk for impaired skin integrity will decrease Outcome: Progressing

## 2023-05-18 ENCOUNTER — Observation Stay (HOSPITAL_BASED_OUTPATIENT_CLINIC_OR_DEPARTMENT_OTHER)

## 2023-05-18 ENCOUNTER — Encounter (HOSPITAL_COMMUNITY): Payer: Self-pay | Admitting: Family Medicine

## 2023-05-18 DIAGNOSIS — T887XXA Unspecified adverse effect of drug or medicament, initial encounter: Secondary | ICD-10-CM

## 2023-05-18 DIAGNOSIS — R931 Abnormal findings on diagnostic imaging of heart and coronary circulation: Secondary | ICD-10-CM | POA: Diagnosis not present

## 2023-05-18 DIAGNOSIS — R079 Chest pain, unspecified: Secondary | ICD-10-CM

## 2023-05-18 DIAGNOSIS — R7989 Other specified abnormal findings of blood chemistry: Secondary | ICD-10-CM | POA: Diagnosis not present

## 2023-05-18 DIAGNOSIS — I517 Cardiomegaly: Secondary | ICD-10-CM | POA: Diagnosis not present

## 2023-05-18 DIAGNOSIS — R072 Precordial pain: Secondary | ICD-10-CM | POA: Diagnosis not present

## 2023-05-18 MED ORDER — LISINOPRIL 20 MG PO TABS: 20.0000 mg | ORAL_TABLET | Freq: Every day | ORAL | 1 refills | Status: DC

## 2023-05-18 MED ORDER — NITROGLYCERIN 0.4 MG SL SUBL
SUBLINGUAL_TABLET | SUBLINGUAL | Status: AC
  Filled 2023-05-18: qty 2

## 2023-05-18 MED ORDER — IOHEXOL 350 MG/ML SOLN
95.0000 mL | Freq: Once | INTRAVENOUS | Status: AC | PRN
  Administered 2023-05-18: 95 mL via INTRAVENOUS

## 2023-05-18 MED ORDER — NITROGLYCERIN 0.4 MG SL SUBL
0.8000 mg | SUBLINGUAL_TABLET | Freq: Once | SUBLINGUAL | Status: AC
  Administered 2023-05-18: 0.8 mg via SUBLINGUAL

## 2023-05-18 MED ORDER — ATORVASTATIN CALCIUM 20 MG PO TABS: 20.0000 mg | ORAL_TABLET | Freq: Every day | ORAL | 1 refills | Status: DC

## 2023-05-18 NOTE — Plan of Care (Signed)

## 2023-05-18 NOTE — Discharge Summary (Signed)
 Physician Discharge Summary  Luis Hardin NWG:[id number] DOB: 04-06-1965 DOA: 05/15/2023  PCP: Alvina Filbert, MD  Admit date: 05/15/2023 Discharge date: 05/18/23  Admitted From: Home Disposition: Home Recommendations for Outpatient Follow-up:  Follow up with PCP in 1 to 2 weeks Cardiology to arrange outpatient follow-up Check blood pressure, CMP and CBC at follow-up Please follow up on the following pending results: None  Home Health: No need identified Equipment/Devices: No need identified  Discharge Condition: Stable CODE STATUS: Full code  Follow-up Information     Alvina Filbert, MD Follow up on 05/18/2023.   Specialty: Internal Medicine Why: The office will call patient. Contact information: [address] [place] [address] [phone]                 Hospital course 58 y.o. male with medical history significant for prostate cancer, hypertension.  Patient presented to the ED with complaint of chest pain over the past month, he reports 4 episodes of this.  This chest pains have occurred with sexual activity or related to or thinking about sexual activity, when he took Viagra which was prescribed for his prostate,  Chest pain is mid chest, radiates down his left arm.  He reduced the dose of Viagra from 100 to 50 mg and noticed reduced intensity of his chest pain.  This pain lasted about 10 to 20 minutes.  No prior cardiac disease.  Prior to the past month he has never had chest pains or dyspnea with exertion or at rest.  Cardiology was consulted and recommending that he be transferred to [facility] for CT cardiac study.   TTE with LVEF of 60 to 65%, G1DD and mild to moderately enlarged RV.  Coronary CT with nonobstructive CAD.  LDL elevated to 90.  Patient remained chest pain-free.  Viagra discontinued.  Patient was cleared for discharge on low-dose Lipitor.  Cardiology to arrange outpatient follow-up.  We have decreased his lisinopril to 20 mg daily as well.    See  individual problem list below for more.   Problems addressed during this hospitalization Principal Problem:   Chest pain Active Problems:   Prostate cancer (HCC)   HTN (hypertension)   Elevated troponin   Side effect of drug   Right ventricular enlargement   Chest pain of uncertain etiology   Chest pain/elevated troponin: Some temporal association with use of Viagra and sexual activity.  TTE and coronary CT as above.  LDL 90.  No hyperglycemia to suggest diabetes.  Quit smoking about 30 years ago. -Discontinued Viagra.  Encouraged to discuss alternative with his urologist -Decrease lisinopril from 40 to 20 mg daily -Started low-dose Lipitor -Cardiology to arrange outpatient follow-up  Essential hypertension: Normotensive on 10 mg Lipitor here.  Was on 40 mg at home - Discharged on Lipitor 20 mg daily   Prostate cancer/erectile dysfunction -Discontinued Viagra -Encouraged to discuss alternative with his urologist   Right ventricular enlargement: Noted on TTE -Outpatient follow-up.           Time spent 35 minutes  Vital signs Vitals:   05/18/23 0020 05/18/23 0412 05/18/23 0738 05/18/23 1200  BP: 109/71 122/73 121/78 123/80  Pulse: 75 74 65 73  Temp: 97.8 F (36.6 C) 97.8 F (36.6 C) 97.7 F (36.5 C) 97.7 F (36.5 C)  Resp: 18 18 20 20   Height:      Weight: 93.9 kg     SpO2: 96% 98% 98% 99%  TempSrc: Oral Oral Oral Oral  BMI (Calculated): 30.55  Discharge exam  GENERAL: No apparent distress.  Nontoxic. HEENT: MMM.  Vision and hearing grossly intact.  NECK: Supple.  No apparent JVD.  RESP:  No IWOB.  Fair aeration bilaterally. CVS:  RRR. Heart sounds normal.  ABD/GI/GU: BS+. Abd soft, NTND.  MSK/EXT:  Moves extremities. No apparent deformity. No edema.  SKIN: no apparent skin lesion or wound NEURO: Awake and alert. Oriented appropriately.  No apparent focal neuro deficit. PSYCH: Calm. Normal affect.   Discharge Instructions Discharge Instructions      Diet - low sodium heart healthy   Complete by: As directed    Discharge instructions   Complete by: As directed    It has been a pleasure taking care of you!  You were hospitalized due to chest pain.  It is unclear what caused your chest pain.  There was no significant finding on your echocardiogram or CT of your heart to explain your chest pain.  We have stopped your Viagra since it could contribute to low blood pressure and dizziness. We suggest you discuss alternative option with your urologist.  We have also decreased your lisinopril.  We have started you on Lipitor (cholesterol medication) to decrease your risk of heart attack.  Follow-up with cardiology per their recommendation. Please review your new medication list and the directions on your medications before you take them.   Take care,   Increase activity slowly   Complete by: As directed       Allergies as of 05/18/2023   No Known Allergies      Medication List     STOP taking these medications    ibuprofen 200 MG tablet Commonly known as: ADVIL   sildenafil 100 MG tablet Commonly known as: VIAGRA       TAKE these medications    acetaminophen 500 MG tablet Commonly known as: TYLENOL Take 1,000 mg by mouth every 6 (six) hours as needed for moderate pain.   atorvastatin 20 MG tablet Commonly known as: Lipitor Take 1 tablet (20 mg total) by mouth daily.   lisinopril 20 MG tablet Commonly known as: ZESTRIL Take 1 tablet (20 mg total) by mouth daily with breakfast. Start taking on: May 19, 2023 What changed:  medication strength how much to take   omeprazole 20 MG capsule Commonly known as: PRILOSEC Take 20 mg by mouth daily.   triamcinolone cream 0.1 % Commonly known as: KENALOG Apply 1 Application topically 2 (two) times daily as needed (eczema).   VISINE OP Place 1 drop into both eyes daily as needed (irritation).        Consultations: Cardiology  Procedures/Studies:   CT  CORONARY FFR DATA PREP & FLUID ANALYSIS Result Date: 05/18/2023 EXAM: CT FFR ANALYSIS CLINICAL DATA:  Chest pain FINDINGS: FFRct analysis was performed on the original cardiac CT angiogram dataset. Diagrammatic representation of the FFRct analysis is provided in a separate PDF document in PACS. This dictation was created using the PDF document and an interactive 3D model of the results. 3D model is not available in the EMR/PACS. Normal FFR range is >0.80. Indeterminate (grey) zone is 0.76-0.80. FFR delta of 0.13 is considered significant. 1. LAD: Proximal FFR = 0.99, mid FFR = 0.97, Distal FFR = not modeled, due to size of vessel caliber 2. LCX: Proximal FFR = 1.0, Distal FFR = 0.98 3. RCA: Proximal FFR = 0.99, mid FFR =0.96, Distal FFR = 0.95 IMPRESSION: 1.  CT FFR analysis showed no significant stenosis. RECOMMENDATIONS: Guideline-directed medical therapy and aggressive  risk factor modification for secondary prevention of coronary artery disease. Electronically Signed   By: Tessa Lerner   On: 05/18/2023 14:19   CT CORONARY MORPH W/CTA COR W/SCORE W/CA W/CM &/OR WO/CM Result Date: 05/18/2023 HISTORY: Chest pain, nonspecific EXAM: Cardiac/Coronary  CT TECHNIQUE: The patient was scanned on a [employer]. PROTOCOL: A 120 kV prospective scan was triggered in the descending thoracic aorta at 111 HU's. Axial non-contrast 3 mm slices were carried out through the heart. The data set was analyzed on a dedicated work station and scored using the Agatston method. Gantry rotation speed was 250 msecs and collimation was .6 mm. Beta blockade and 0.8 mg of sl NTG was given. The 3D data set was reconstructed in 5% intervals of the 35-75 % of the R-R cycle. Systolic and diastolic phases were analyzed on a dedicated work station using MPR, MIP and VRT modes. The patient received contrast: 95 ML OMNIPAQUE IOHEXOL 350 MG/ML SOLN . MEDICATIONS: Beta blockade and 0.8 mg of sl NTG was given. FINDINGS: Image quality: Good  Artifact: Limited Coronary calcium score is 7.23, which places the patient in the 42nd percentile for age and sex matched control. 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 Left circumflex originates from left coronary cusp. Right dominance. Left Anterior Descending Coronary Artery: Originates from the right coronary cusp. Proximal segment patent but courses between the aorta and pulmonary artery, mid segment minimal calcified plaque (0-24%), distal / apical segments patent. First diagonal branch: Normal caliber, bifurcates into superior and inferior branches, patent. Left Circumflex Artery: Non-dominant, terminates within the AV groove, gives off 2 obtuse marginal branches, LCX is patent. First obtuse marginal branch: Patent Second obtuse marginal branch: Patent Right Coronary Artery: Dominant.  Patent. Aorta: Normal size, 30.4 mm at the mid ascending aorta (level of the PA bifurcation) measured double oblique. Aortic Valve: Trileaflet, no calcifications. Other findings: Patent foramen ovale noted. Normal pulmonary vein drainage into the left atrium. Normal left atrial appendage without thrombus. Normal size of the pulmonary artery. Please see separate report from [facility] for non-cardiac findings. IMPRESSION: 1. Coronary calcium score of 7.23. This was 42nd percentile for age and sex matched control. 2. Total plaque volume 12 mm3 which is 12 th percentile for age- and sex-matched controls (calcified plaque 1 mm3; non-calcified plaque 11 mm3). TPV is mild. 3. 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 Right dominance. 4. CAD-RADS 1 Minimal calcified plaque (0-24%) mid LAD. 5. 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 Findings will be reported separately. 6. Patent foramen ovale noted. RECOMMENDATIONS: Consider  non-atherosclerotic causes of chest pain. Consider preventive therapy and risk factor modification. Electronically Signed   By: Tessa Lerner   On: 05/18/2023 14:14   ECHOCARDIOGRAM COMPLETE Result Date: 05/16/2023    ECHOCARDIOGRAM REPORT   Patient Name:   Luis Hardin Date of Exam: 05/16/2023 Medical Rec #:  [id number]    Height:       69.0 in Accession #:    [id number]   Weight:       205.9 lb Date of Birth:  18-Nov-1965    BSA:          2.092 m Patient Age:    58 years     BP:           130/68 mmHg Patient Gender: M  HR:           68 bpm. Exam Location:  [facility] Procedure: 2D Echo, Cardiac Doppler, Color Doppler and Intracardiac            Opacification Agent (Both Spectral and Color Flow Doppler were            utilized during procedure). Indications:    Chest pain  History:        Patient has no prior history of Echocardiogram examinations.                 Risk Factors:Former Smoker and Hypertension.  Sonographer:    Juanita Shaw Referring Phys: [id number] EJIROGHENE E EMOKPAE IMPRESSIONS  1. No LV thrombus by Definity. Left ventricular ejection fraction, by estimation, is 60 to 65%. The left ventricle has normal function. The left ventricle has no regional wall motion abnormalities. Left ventricular diastolic parameters are consistent with Grade I diastolic dysfunction (impaired relaxation).  2. Right ventricular systolic function is normal. The right ventricular size is mild to moderately enlarged. Tricuspid regurgitation signal is inadequate for assessing PA pressure.  3. The mitral valve is grossly normal. Trivial mitral valve regurgitation. No evidence of mitral stenosis.  4. The aortic valve has an indeterminant number of cusps. Aortic valve regurgitation is not visualized. No aortic stenosis is present. Comparison(s): No prior Echocardiogram. FINDINGS  Left Ventricle: No LV thrombus by Definity. Left ventricular ejection fraction, by estimation, is 60 to 65%. The left ventricle has normal  function. The left ventricle has no regional wall motion abnormalities. Strain was performed and the global longitudinal strain is indeterminate. The left ventricular internal cavity size was normal in size. There is no left ventricular hypertrophy. Left ventricular diastolic parameters are consistent with Grade I diastolic dysfunction (impaired relaxation). Normal left ventricular filling pressure. Right Ventricle: The right ventricular size is mild to moderately enlarged. No increase in right ventricular wall thickness. Right ventricular systolic function is normal. Tricuspid regurgitation signal is inadequate for assessing PA pressure. Left Atrium: Left atrial size was normal in size. Right Atrium: Right atrial size was normal in size. Pericardium: There is no evidence of pericardial effusion. Mitral Valve: The mitral valve is grossly normal. Trivial mitral valve regurgitation. No evidence of mitral valve stenosis. MV peak gradient, 2.0 mmHg. The mean mitral valve gradient is 1.0 mmHg. Tricuspid Valve: The tricuspid valve is not well visualized. Tricuspid valve regurgitation is not demonstrated. No evidence of tricuspid stenosis. Aortic Valve: The aortic valve has an indeterminant number of cusps. Aortic valve regurgitation is not visualized. No aortic stenosis is present. Aortic valve mean gradient measures 3.0 mmHg. Aortic valve peak gradient measures 5.3 mmHg. Aortic valve area, by VTI measures 2.73 cm. Pulmonic Valve: The pulmonic valve was not well visualized. Pulmonic valve regurgitation is not visualized. No evidence of pulmonic stenosis. Aorta: The aortic root and ascending aorta are structurally normal, with no evidence of dilitation. Venous: The inferior vena cava was not well visualized. IAS/Shunts: The interatrial septum was not well visualized. Additional Comments: 3D was performed not requiring image post processing on an independent workstation and was indeterminate.  LEFT VENTRICLE PLAX 2D LVIDd:          4.70 cm      Diastology LVIDs:         2.50 cm      LV e' medial:    8.81 cm/s LV PW:         1.20 cm      LV  E/e' medial:  8.2 LV IVS:        1.10 cm      LV e' lateral:   12.70 cm/s LVOT diam:     2.20 cm      LV E/e' lateral: 5.7 LV SV:         53 LV SV Index:   25 LVOT Area:     3.80 cm  LV Volumes (MOD) LV vol d, MOD A2C: 149.0 ml LV vol d, MOD A4C: 182.5 ml LV vol s, MOD A2C: 46.5 ml LV vol s, MOD A4C: 56.6 ml LV SV MOD A2C:     102.5 ml LV SV MOD A4C:     182.5 ml LV SV MOD BP:      114.5 ml RIGHT VENTRICLE             IVC RV Basal diam:  4.30 cm     IVC diam: 0.80 cm RV Mid diam:    3.00 cm RV S prime:     12.60 cm/s TAPSE (M-mode): 3.1 cm LEFT ATRIUM             Index        RIGHT ATRIUM           Index LA diam:        3.90 cm 1.86 cm/m   RA Area:     10.20 cm LA Vol (A2C):   40.9 ml 19.55 ml/m  RA Volume:   16.00 ml  7.65 ml/m LA Vol (A4C):   42.5 ml 20.32 ml/m LA Biplane Vol: 41.7 ml 19.94 ml/m  AORTIC VALVE                    PULMONIC VALVE AV Area (Vmax):    2.63 cm     PV Vmax:       0.74 m/s AV Area (Vmean):   2.25 cm     PV Peak grad:  2.2 mmHg AV Area (VTI):     2.73 cm AV Vmax:           115.00 cm/s AV Vmean:          83.800 cm/s AV VTI:            0.195 m AV Peak Grad:      5.3 mmHg AV Mean Grad:      3.0 mmHg LVOT Vmax:         79.60 cm/s LVOT Vmean:        49.700 cm/s LVOT VTI:          0.140 m LVOT/AV VTI ratio: 0.72  AORTA Ao Root diam: 3.40 cm Ao Asc diam:  3.00 cm MITRAL VALVE MV Area (PHT): 3.00 cm    SHUNTS MV Area VTI:   2.42 cm    Systemic VTI:  0.14 m MV Peak grad:  2.0 mmHg    Systemic Diam: 2.20 cm MV Mean grad:  1.0 mmHg MV Vmax:       0.71 m/s MV Vmean:      47.4 cm/s MV Decel Time: 253 msec MV E velocity: 72.00 cm/s MV A velocity: 66.80 cm/s MV E/A ratio:  1.08 Vishnu Priya Mallipeddi Electronically signed by Lucetta Russel Mallipeddi Signature Date/Time: 05/16/2023/4:06:10 PM    Final    DG Chest 2 View Result Date: 05/15/2023 CLINICAL DATA:  Chest pain, left  arm pain EXAM: CHEST - 2 VIEW COMPARISON:  02/08/2013 FINDINGS: The heart size and mediastinal contours are within normal  limits. Both lungs are clear. The visualized skeletal structures are unremarkable. IMPRESSION: No active cardiopulmonary disease. Electronically Signed   By: Janeece Mechanic M.D.   On: 05/15/2023 18:12       The results of significant diagnostics from this hospitalization (including imaging, microbiology, ancillary and laboratory) are listed below for reference.     Microbiology: No results found for this or any previous visit (from the past 240 hours).   Labs:  CBC: Recent Labs  Lab 05/15/23 1509  WBC 9.5  HGB 13.6  HCT 39.9  MCV 89.7  PLT 262   BMP &GFR Recent Labs  Lab 05/15/23 1509  NA 137  K 4.0  CL 105  CO2 23  GLUCOSE 97  BUN 11  CREATININE 1.02  CALCIUM 9.2   Estimated Creatinine Clearance: 89.3 mL/min (by C-G formula based on SCr of 1.02 mg/dL). Liver & Pancreas: Recent Labs  Lab 05/15/23 1509  AST 17  ALT 19  ALKPHOS 64  BILITOT 0.2  PROT 6.6  ALBUMIN 4.0   No results for input(s): "LIPASE", "AMYLASE" in the last 168 hours. No results for input(s): "AMMONIA" in the last 168 hours. Diabetic: No results for input(s): "HGBA1C" in the last 72 hours. No results for input(s): "GLUCAP" in the last 168 hours. Cardiac Enzymes: No results for input(s): "CKTOTAL", "CKMB", "CKMBINDEX", "TROPONINI" in the last 168 hours. No results for input(s): "PROBNP" in the last 8760 hours. Coagulation Profile: No results for input(s): "INR", "PROTIME" in the last 168 hours. Thyroid Function Tests: No results for input(s): "TSH", "T4TOTAL", "FREET4", "T3FREE", "THYROIDAB" in the last 72 hours. Lipid Profile: Recent Labs    05/17/23 0419  CHOL 151  HDL 30*  LDLCALC 90  TRIG 161*  CHOLHDL 5.0   Anemia Panel: No results for input(s): "VITAMINB12", "FOLATE", "FERRITIN", "TIBC", "IRON", "RETICCTPCT" in the last 72 hours. Urine analysis:     Component Value Date/Time   APPEARANCEUR Clear 02/22/2022 1303   GLUCOSEU Negative 02/22/2022 1303   BILIRUBINUR Negative 02/22/2022 1303   PROTEINUR Negative 02/22/2022 1303   NITRITE Negative 02/22/2022 1303   LEUKOCYTESUR Negative 02/22/2022 1303   Sepsis Labs: Invalid input(s): "PROCALCITONIN", "LACTICIDVEN"   SIGNED:  Theadore Finger, MD  [facility] 05/18/2023, 3:24 PM

## 2023-05-18 NOTE — TOC Transition Note (Signed)
 Transition of Care [facility]) - Discharge Note   Patient Details  Name: Luis Hardin MRN: [id number] Date of Birth: 12-Nov-1965  [facility]) CM/SW Contact:  Jennett Model, RN Phone Number: 05/18/2023, 3:09 PM   Clinical Narrative:    For dc today, daughter is at the bedside to transport him home.  He has no needs.     Barriers to Discharge: Continued Medical Work up   Patient Goals and CMS Choice            Discharge Placement                       Discharge Plan and Services Additional resources added to the After Visit Summary for                                       Social Drivers of Health (SDOH) Interventions SDOH Screenings   Food Insecurity: No Food Insecurity (05/15/2023)  Housing: Low Risk  (05/15/2023)  Transportation Needs: No Transportation Needs (05/15/2023)  Utilities: Not At Risk (05/15/2023)  Tobacco Use: Medium Risk (05/16/2023)     Readmission Risk Interventions     No data to display

## 2023-05-18 NOTE — Progress Notes (Addendum)
 Progress Note  Patient Name: Luis Hardin Date of Encounter: 05/18/2023  Primary Cardiologist:   Marjo Bicker, MD   Subjective   No further chest pain.  No SOB.    Inpatient Medications    Scheduled Meds:  aspirin EC  81 mg Oral Daily   enoxaparin (LOVENOX) injection  40 mg Subcutaneous Q24H   lisinopril  10 mg Oral Q breakfast   pantoprazole  40 mg Oral Daily   Continuous Infusions:  PRN Meds: acetaminophen **OR** acetaminophen, ondansetron **OR** ondansetron (ZOFRAN) IV, polyethylene glycol   Vital Signs    Vitals:   05/17/23 1927 05/18/23 0020 05/18/23 0412 05/18/23 0738  BP: 123/75 109/71 122/73 121/78  Pulse: 74 75 74 65  Resp: 18 18 18 20   Temp: 97.7 F (36.5 C) 97.8 F (36.6 C) 97.8 F (36.6 C) 97.7 F (36.5 C)  TempSrc: Oral Oral Oral Oral  SpO2: 95% 96% 98% 98%  Weight:  93.9 kg    Height:       No intake or output data in the 24 hours ending 05/18/23 0849 Filed Weights   05/15/23 1433 05/16/23 0830 05/18/23 0020  Weight: 96.2 kg 93.4 kg 93.9 kg    Telemetry    NSR - Personally Reviewed  ECG    NA - Personally Reviewed  Physical Exam   GEN: No acute distress.   Neck: No  JVD Cardiac: RRR, no murmurs, rubs, or gallops.  Respiratory: Clear  to auscultation bilaterally. GI: Soft, nontender, non-distended  MS: No  edema; No deformity. Neuro:  Nonfocal  Psych: Normal affect   Labs    Chemistry Recent Labs  Lab 05/15/23 1509  NA 137  K 4.0  CL 105  CO2 23  GLUCOSE 97  BUN 11  CREATININE 1.02  CALCIUM 9.2  PROT 6.6  ALBUMIN 4.0  AST 17  ALT 19  ALKPHOS 64  BILITOT 0.2  GFRNONAA >60  ANIONGAP 9     Hematology Recent Labs  Lab 05/15/23 1509  WBC 9.5  RBC 4.45  HGB 13.6  HCT 39.9  MCV 89.7  MCH 30.6  MCHC 34.1  RDW 12.4  PLT 262    Cardiac EnzymesNo results for input(s): "TROPONINI" in the last 168 hours. No results for input(s): "TROPIPOC" in the last 168 hours.   BNPNo results for input(s): "BNP",  "PROBNP" in the last 168 hours.   DDimer  Recent Labs  Lab 05/15/23 1509  DDIMER 0.41     Radiology    ECHOCARDIOGRAM COMPLETE Result Date: 05/16/2023    ECHOCARDIOGRAM REPORT   Patient Name:   MAURY GRONINGER Date of Exam: 05/16/2023 Medical Rec #:  [id number]    Height:       69.0 in Accession #:    [id number]   Weight:       205.9 lb Date of Birth:  1965/02/07    BSA:          2.092 m Patient Age:    58 years     BP:           130/68 mmHg Patient Gender: M            HR:           68 bpm. [facility] Procedure: 2D Echo, Cardiac Doppler, Color Doppler and Intracardiac            Opacification Agent (Both Spectral and Color Flow Doppler were  utilized during procedure). Indications:    Chest pain  History:        Patient has no prior history of Echocardiogram examinations.                 Risk Factors:Former Smoker and Hypertension.  Sonographer:    Luis Shaw Referring Phys: [id number] EJIROGHENE E EMOKPAE IMPRESSIONS  1. No LV thrombus by Definity. Left ventricular ejection fraction, by estimation, is 60 to 65%. The left ventricle has normal function. The left ventricle has no regional wall motion abnormalities. Left ventricular diastolic parameters are consistent with Grade I diastolic dysfunction (impaired relaxation).  2. Right ventricular systolic function is normal. The right ventricular size is mild to moderately enlarged. Tricuspid regurgitation signal is inadequate for assessing PA pressure.  3. The mitral valve is grossly normal. Trivial mitral valve regurgitation. No evidence of mitral stenosis.  4. The aortic valve has an indeterminant number of cusps. Aortic valve regurgitation is not visualized. No aortic stenosis is present. Comparison(s): No prior Echocardiogram. FINDINGS  Left Ventricle: No LV thrombus by Definity. Left ventricular ejection fraction, by estimation, is 60 to 65%. The left ventricle has normal function. The left ventricle has no regional wall motion  abnormalities. Strain was performed and the global longitudinal strain is indeterminate. The left ventricular internal cavity size was normal in size. There is no left ventricular hypertrophy. Left ventricular diastolic parameters are consistent with Grade I diastolic dysfunction (impaired relaxation). Normal left ventricular filling pressure. Right Ventricle: The right ventricular size is mild to moderately enlarged. No increase in right ventricular wall thickness. Right ventricular systolic function is normal. Tricuspid regurgitation signal is inadequate for assessing PA pressure. Left Atrium: Left atrial size was normal in size. Right Atrium: Right atrial size was normal in size. Pericardium: There is no evidence of pericardial effusion. Mitral Valve: The mitral valve is grossly normal. Trivial mitral valve regurgitation. No evidence of mitral valve stenosis. MV peak gradient, 2.0 mmHg. The mean mitral valve gradient is 1.0 mmHg. Tricuspid Valve: The tricuspid valve is not well visualized. Tricuspid valve regurgitation is not demonstrated. No evidence of tricuspid stenosis. Aortic Valve: The aortic valve has an indeterminant number of cusps. Aortic valve regurgitation is not visualized. No aortic stenosis is present. Aortic valve mean gradient measures 3.0 mmHg. Aortic valve peak gradient measures 5.3 mmHg. Aortic valve area, by VTI measures 2.73 cm. Pulmonic Valve: The pulmonic valve was not well visualized. Pulmonic valve regurgitation is not visualized. No evidence of pulmonic stenosis. Aorta: The aortic root and ascending aorta are structurally normal, with no evidence of dilitation. Venous: The inferior vena cava was not well visualized. IAS/Shunts: The interatrial septum was not well visualized. Additional Comments: 3D was performed not requiring image post processing on an independent workstation and was indeterminate.  LEFT VENTRICLE PLAX 2D LVIDd:         4.70 cm      Diastology LVIDs:         2.50 cm       LV e' medial:    8.81 cm/s LV PW:         1.20 cm      LV E/e' medial:  8.2 LV IVS:        1.10 cm      LV e' lateral:   12.70 cm/s LVOT diam:     2.20 cm      LV E/e' lateral: 5.7 LV SV:         53 LV SV Index:  25 LVOT Area:     3.80 cm  LV Volumes (MOD) LV vol d, MOD A2C: 149.0 ml LV vol d, MOD A4C: 182.5 ml LV vol s, MOD A2C: 46.5 ml LV vol s, MOD A4C: 56.6 ml LV SV MOD A2C:     102.5 ml LV SV MOD A4C:     182.5 ml LV SV MOD BP:      114.5 ml RIGHT VENTRICLE             IVC RV Basal diam:  4.30 cm     IVC diam: 0.80 cm RV Mid diam:    3.00 cm RV S prime:     12.60 cm/s TAPSE (M-mode): 3.1 cm LEFT ATRIUM             Index        RIGHT ATRIUM           Index LA diam:        3.90 cm 1.86 cm/m   RA Area:     10.20 cm LA Vol (A2C):   40.9 ml 19.55 ml/m  RA Volume:   16.00 ml  7.65 ml/m LA Vol (A4C):   42.5 ml 20.32 ml/m LA Biplane Vol: 41.7 ml 19.94 ml/m  AORTIC VALVE                    PULMONIC VALVE AV Area (Vmax):    2.63 cm     PV Vmax:       0.74 m/s AV Area (Vmean):   2.25 cm     PV Peak grad:  2.2 mmHg AV Area (VTI):     2.73 cm AV Vmax:           115.00 cm/s AV Vmean:          83.800 cm/s AV VTI:            0.195 m AV Peak Grad:      5.3 mmHg AV Mean Grad:      3.0 mmHg LVOT Vmax:         79.60 cm/s LVOT Vmean:        49.700 cm/s LVOT VTI:          0.140 m LVOT/AV VTI ratio: 0.72  AORTA Ao Root diam: 3.40 cm Ao Asc diam:  3.00 cm MITRAL VALVE MV Area (PHT): 3.00 cm    SHUNTS MV Area VTI:   2.42 cm    Systemic VTI:  0.14 m MV Peak grad:  2.0 mmHg    Systemic Diam: 2.20 cm MV Mean grad:  1.0 mmHg MV Vmax:       0.71 m/s MV Vmean:      47.4 cm/s MV Decel Time: 253 msec MV E velocity: 72.00 cm/s MV A velocity: 66.80 cm/s MV E/A ratio:  1.08 Vishnu Priya Mallipeddi Electronically signed by Winfield Rast Mallipeddi Signature Date/Time: 05/16/2023/4:06:10 PM    Final     Cardiac Studies   Echo:     1. No LV thrombus by Definity. Left ventricular ejection fraction, by  estimation, is 60 to 65%.  The left ventricle has normal function. The left  ventricle has no regional wall motion abnormalities. Left ventricular  diastolic parameters are consistent  with Grade I diastolic dysfunction (impaired relaxation).   2. Right ventricular systolic function is normal. The right ventricular  size is mild to moderately enlarged. Tricuspid regurgitation signal is  inadequate for assessing PA pressure.   3. The mitral valve  is grossly normal. Trivial mitral valve  regurgitation. No evidence of mitral stenosis.   4. The aortic valve has an indeterminant number of cusps. Aortic valve  regurgitation is not visualized. No aortic stenosis is present.   Patient Profile     58 y.o. male  known to have HTN, HLD, prostate adenocarcinoma s/p radical prostatectomy in 2024 presented to the ER with chest pain   Assessment & Plan    Precordial chest pain:  Coronary CT with   Results pending.  Trop was borderline.  Discussed risk reduction.  Plan pending the results   Addendum:  I spoke with the patient.  I received a prelim result from Dr. Albert Huff.  No obstructive disease on CT .  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  I have suggested Lipitor secondary to the plaque and mildly elevated LDL.  No further intervention.  We have arranged a one time follow up clinic.   HTN:    Continue current meds   Risk reduction:  LDL was 90.  Goals of therapy pending the CT results.     For questions or updates, please contact [facility] Please consult [email or web address] for contact info under Cardiology/STEMI.   Signed, Eilleen Grates, MD  05/18/2023, 8:49 AM

## 2023-07-04 NOTE — Progress Notes (Unsigned)
  Cardiology Office Note:   Date:  07/06/2023  ID:  Elder Greening Pancoast, DOB 08-31-1965, MRN [id number] PCP: Twylla Galen, MD  [facility] Providers Cardiologist:  Lasalle Pointer, MD {  History of Present Illness:   Hamzeh Tall is a 58 y.o. male  with medical history significant for prostate cancer, hypertension.  Patient presented April 2025 with complaints of chest pain .  TTE demonstrated LVEF of 60 to 65%, G1DD and mild to moderately enlarged RV.  Coronary CT with nonobstructive CAD.  The calcium  score was 7.23 with mild plaque.  The LAD comes from the right coronary cusp.   Since he was seen he has had no new cardiovascular complaints. The patient denies any new symptoms such as chest discomfort, neck or arm discomfort. There has been no new shortness of breath, PND or orthopnea. There have been no reported palpitations, presyncope or syncope.   ROS: As stated in the HPI and negative for all other systems.  Studies Reviewed:    EKG:    NA  Risk Assessment/Calculations:              Physical Exam:   VS:  BP 126/82 (BP Location: Left Arm, Patient Position: Sitting, Cuff Size: Normal)   Pulse 69   Ht 5\' 9"  (1.753 m)   Wt 214 lb 6.4 oz (97.3 kg)   SpO2 98%   BMI 31.66 kg/m    Wt Readings from Last 3 Encounters:  07/06/23 214 lb 6.4 oz (97.3 kg)  05/18/23 207 lb (93.9 kg)  04/21/22 211 lb 10.3 oz (96 kg)     GEN: Well nourished, well developed in no acute distress NECK: No JVD; No carotid bruits CARDIAC: RRR, no murmurs, rubs, gallops RESPIRATORY:  Clear to auscultation without rales, wheezing or rhonchi  ABDOMEN: Soft, non-tender, non-distended EXTREMITIES:  No edema; No deformity   ASSESSMENT AND PLAN:   Precordial chest pain:  Coronary CT with   Results pending.  Trop was borderline.  Discussed risk reduction.  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  No further testing is needed.  The vessel does  coursed between the aorta and the pulmonary vein but it does not look like it is causing any ongoing symptoms or ischemia.  His EKG was not diagnostic.  His enzymes were nondiagnostic.   HTN:    BP is at target.  No change in therapy.    Risk reduction:  LDL was 90.  He will continue the meds as listed and we will continue with lifestyle modification.   Pulmonary nodules:  Follow up CT is suggested in one year.       Follow up with me as needed.   Signed, Eilleen Grates, MD

## 2023-07-06 ENCOUNTER — Encounter: Payer: Self-pay | Admitting: Cardiology

## 2023-07-06 ENCOUNTER — Ambulatory Visit: Attending: Cardiology | Admitting: Cardiology

## 2023-07-06 VITALS — BP 126/82 | HR 69 | Ht 69.0 in | Wt 214.4 lb

## 2023-07-06 DIAGNOSIS — Q245 Malformation of coronary vessels: Secondary | ICD-10-CM

## 2023-07-06 DIAGNOSIS — I10 Essential (primary) hypertension: Secondary | ICD-10-CM

## 2023-07-06 DIAGNOSIS — R918 Other nonspecific abnormal finding of lung field: Secondary | ICD-10-CM

## 2023-07-06 NOTE — Patient Instructions (Signed)
 Medication Instructions:  Your physician recommends that you continue on your current medications as directed. Please refer to the Current Medication list given to you today.  *If you need a refill on your cardiac medications before your next appointment, please call your pharmacy*  Lab Work: NONE If you have labs (blood work) drawn today and your tests are completely normal, you will receive your results only by: MyChart Message (if you have MyChart) OR A paper copy in the mail If you have any lab test that is abnormal or we need to change your treatment, we will call you to review the results.  Testing/Procedures: Chest CT in 1 year  Follow-Up: At [facility], you and your health needs are our priority.  As part of our continuing mission to provide you with exceptional heart care, our providers are all part of one team.  This team includes your primary Cardiologist (physician) and Advanced Practice Providers or APPs (Physician Assistants and Nurse Practitioners) who all work together to provide you with the care you need, when you need it.  Your next appointment:   As needed  Provider:   Lavonne Prairie, MD  We recommend signing up for the patient portal called "MyChart".  Sign up information is provided on this After Visit Summary.  MyChart is used to connect with patients for Virtual Visits (Telemedicine).  Patients are able to view lab/test results, encounter notes, upcoming appointments, etc.  Non-urgent messages can be sent to your provider as well.   To learn more about what you can do with MyChart, go to [email or web address].

## 2023-08-12 ENCOUNTER — Other Ambulatory Visit (HOSPITAL_COMMUNITY)

## 2023-08-12 ENCOUNTER — Other Ambulatory Visit: Payer: Self-pay

## 2023-08-12 ENCOUNTER — Inpatient Hospital Stay (HOSPITAL_COMMUNITY)
Admission: EM | Admit: 2023-08-12 | Discharge: 2023-08-14 | DRG: 281 | Disposition: A | Discharge status: Home / Self Care | Attending: Internal Medicine | Admitting: Internal Medicine

## 2023-08-12 ENCOUNTER — Encounter (HOSPITAL_COMMUNITY): Payer: Self-pay

## 2023-08-12 ENCOUNTER — Emergency Department (HOSPITAL_COMMUNITY)

## 2023-08-12 DIAGNOSIS — Z87891 Personal history of nicotine dependence: Secondary | ICD-10-CM | POA: Diagnosis not present

## 2023-08-12 DIAGNOSIS — Z79899 Other long term (current) drug therapy: Secondary | ICD-10-CM

## 2023-08-12 DIAGNOSIS — R918 Other nonspecific abnormal finding of lung field: Secondary | ICD-10-CM | POA: Diagnosis present

## 2023-08-12 DIAGNOSIS — Z8249 Family history of ischemic heart disease and other diseases of the circulatory system: Secondary | ICD-10-CM

## 2023-08-12 DIAGNOSIS — Q2112 Patent foramen ovale: Secondary | ICD-10-CM

## 2023-08-12 DIAGNOSIS — I10 Essential (primary) hypertension: Secondary | ICD-10-CM | POA: Diagnosis present

## 2023-08-12 DIAGNOSIS — I214 Non-ST elevation (NSTEMI) myocardial infarction: Secondary | ICD-10-CM | POA: Diagnosis not present

## 2023-08-12 DIAGNOSIS — E66811 Obesity, class 1: Secondary | ICD-10-CM | POA: Diagnosis present

## 2023-08-12 DIAGNOSIS — E785 Hyperlipidemia, unspecified: Secondary | ICD-10-CM | POA: Diagnosis present

## 2023-08-12 DIAGNOSIS — Z8719 Personal history of other diseases of the digestive system: Secondary | ICD-10-CM | POA: Diagnosis not present

## 2023-08-12 DIAGNOSIS — Z8546 Personal history of malignant neoplasm of prostate: Secondary | ICD-10-CM

## 2023-08-12 DIAGNOSIS — R531 Weakness: Secondary | ICD-10-CM | POA: Diagnosis not present

## 2023-08-12 DIAGNOSIS — K219 Gastro-esophageal reflux disease without esophagitis: Secondary | ICD-10-CM | POA: Diagnosis present

## 2023-08-12 DIAGNOSIS — I252 Old myocardial infarction: Secondary | ICD-10-CM

## 2023-08-12 DIAGNOSIS — I2510 Atherosclerotic heart disease of native coronary artery without angina pectoris: Secondary | ICD-10-CM | POA: Diagnosis present

## 2023-08-12 DIAGNOSIS — R079 Chest pain, unspecified: Secondary | ICD-10-CM | POA: Diagnosis not present

## 2023-08-12 DIAGNOSIS — Z7401 Bed confinement status: Secondary | ICD-10-CM | POA: Diagnosis not present

## 2023-08-12 DIAGNOSIS — Z6831 Body mass index (BMI) 31.0-31.9, adult: Secondary | ICD-10-CM

## 2023-08-12 LAB — CBC
HCT: 40 % (ref 39.0–52.0)
Hemoglobin: 14.3 g/dL (ref 13.0–17.0)
MCH: 31.6 pg (ref 26.0–34.0)
MCHC: 35.8 g/dL (ref 30.0–36.0)
MCV: 88.5 fL (ref 80.0–100.0)
Platelets: 293 K/uL (ref 150–400)
RBC: 4.52 MIL/uL (ref 4.22–5.81)
RDW: 12.4 % (ref 11.5–15.5)
WBC: 7.4 K/uL (ref 4.0–10.5)
nRBC: 0 % (ref 0.0–0.2)

## 2023-08-12 LAB — BASIC METABOLIC PANEL WITH GFR
Anion gap: 11 (ref 5–15)
BUN: 11 mg/dL (ref 6–20)
CO2: 22 mmol/L (ref 22–32)
Calcium: 9.1 mg/dL (ref 8.9–10.3)
Chloride: 104 mmol/L (ref 98–111)
Creatinine, Ser: 0.85 mg/dL (ref 0.61–1.24)
GFR, Estimated: >60 mL/min (ref >60)
Glucose, Bld: 118 mg/dL — ABNORMAL HIGH (ref 70–99)
Potassium: 4.1 mmol/L (ref 3.5–5.1)
Sodium: 137 mmol/L (ref 135–145)

## 2023-08-12 LAB — TROPONIN I (HIGH SENSITIVITY)
Troponin I (High Sensitivity): 43 ng/L — ABNORMAL HIGH (ref <18)
Troponin I (High Sensitivity): 146 ng/L (ref <18)
Troponin I (High Sensitivity): 160 ng/L (ref <18)
Troponin I (High Sensitivity): 121 ng/L (ref <18)

## 2023-08-12 LAB — HEPARIN LEVEL (UNFRACTIONATED): Heparin Unfractionated: 0.39 [IU]/mL (ref 0.30–0.70)

## 2023-08-12 MED ORDER — HEPARIN BOLUS VIA INFUSION
4000.0000 [IU] | Freq: Once | INTRAVENOUS | Status: AC
  Administered 2023-08-12: 4000 [IU] via INTRAVENOUS

## 2023-08-12 MED ORDER — ASPIRIN 81 MG PO CHEW
324.0000 mg | CHEWABLE_TABLET | Freq: Once | ORAL | Status: AC
  Administered 2023-08-12: 324 mg via ORAL
  Filled 2023-08-12: qty 4

## 2023-08-12 MED ORDER — ASPIRIN 81 MG PO TBEC
81.0000 mg | DELAYED_RELEASE_TABLET | Freq: Every day | ORAL | Status: DC
  Administered 2023-08-13: 81 mg via ORAL
  Filled 2023-08-12: qty 1

## 2023-08-12 MED ORDER — METOPROLOL TARTRATE 12.5 MG HALF TABLET
12.5000 mg | ORAL_TABLET | Freq: Two times a day (BID) | ORAL | Status: DC
  Administered 2023-08-12 – 2023-08-14 (×4): 12.5 mg via ORAL
  Filled 2023-08-12 (×4): qty 1

## 2023-08-12 MED ORDER — NITROGLYCERIN 0.4 MG SL SUBL: 0.4000 mg | SUBLINGUAL_TABLET | Freq: Once | SUBLINGUAL | Status: DC

## 2023-08-12 MED ORDER — ACETAMINOPHEN 325 MG PO TABS: 650.0000 mg | ORAL_TABLET | ORAL | Status: DC | PRN

## 2023-08-12 MED ORDER — ONDANSETRON HCL 4 MG/2ML IJ SOLN: 4.0000 mg | Freq: Four times a day (QID) | INTRAMUSCULAR | Status: DC | PRN

## 2023-08-12 MED ORDER — HEPARIN (PORCINE) 25000 UT/250ML-% IV SOLN
1300.0000 [IU]/h | INTRAVENOUS | Status: DC
  Administered 2023-08-12 – 2023-08-13 (×2): 1300 [IU]/h via INTRAVENOUS
  Filled 2023-08-12 (×3): qty 250

## 2023-08-12 MED ORDER — PANTOPRAZOLE SODIUM 40 MG PO TBEC
40.0000 mg | DELAYED_RELEASE_TABLET | Freq: Every day | ORAL | Status: DC
  Administered 2023-08-13 – 2023-08-14 (×2): 40 mg via ORAL
  Filled 2023-08-12 (×3): qty 1

## 2023-08-12 MED ORDER — NITROGLYCERIN 0.4 MG SL SUBL: 0.4000 mg | SUBLINGUAL_TABLET | SUBLINGUAL | Status: DC | PRN

## 2023-08-12 MED ORDER — ATORVASTATIN CALCIUM 10 MG PO TABS
20.0000 mg | ORAL_TABLET | Freq: Every day | ORAL | Status: DC
  Administered 2023-08-13 – 2023-08-14 (×2): 20 mg via ORAL
  Filled 2023-08-12 (×3): qty 2

## 2023-08-12 NOTE — Progress Notes (Signed)
 PHARMACY - ANTICOAGULATION CONSULT NOTE  Pharmacy Consult for Heparin  Indication: chest pain/ACS  No Known Allergies  Patient Measurements: Height: 5' 9 (175.3 cm) Weight: 97.3 kg (214 lb 8.1 oz) IBW/kg (Calculated) : 70.7 HEPARIN  DW (KG): 91.1  Vital Signs: Temp: 97.7 F (36.5 C) (07/12 0629) BP: 120/82 (07/12 0645) Pulse Rate: 74 (07/12 0645)  Labs: Recent Labs    08/12/23 0637 08/12/23 0818  HGB 14.3  --   HCT 40.0  --   PLT 293  --   CREATININE 0.85  --   TROPONINIHS 43* 146*    Estimated Creatinine Clearance: 108.9 mL/min (by C-G formula based on SCr of 0.85 mg/dL).   Medical History: Past Medical History:  Diagnosis Date   Cancer (HCC) 2023   prostate cancer   GERD (gastroesophageal reflux disease)    H/O hiatal hernia    Hyperlipidemia    Hypertension    Inguinal hernia    RIGHT   Tendonitis    KNEES    Assessment: 58 yo M hx HTN, HLD admitted for chest pain. Pharmacy consulted to initiate systemic anticoagulation with heparin  for STEMI. Labs and PTA meds reviewed - no contraindications noted.   Goal of Therapy:  Heparin  level 0.3-0.7 units/ml Monitor platelets by anticoagulation protocol: Yes   Plan:  Give 4000 units bolus x 1 Start heparin  infusion at 1300 units/hr Check anti-Xa level in 6 hours and daily while on heparin  Continue to monitor H&H and platelets  Gregorio Cara, PharmD Clinical Pharmacist 08/12/2023 9:47 AM

## 2023-08-12 NOTE — Progress Notes (Signed)
 Patient arrived in the unit, he is alert, denies chest pain, CCMD notified, V/S obtained, CHG bath given, all needs met, call bell in reach. Heparin  infusion continue. Will continue to monitor.    08/12/23 1522  Vitals  Temp 98.5 F (36.9 C)  Temp Source Oral  BP 137/88  MAP (mmHg) 103  BP Location Left Arm  BP Method Automatic  Patient Position (if appropriate) Lying  Pulse Rate 76  Pulse Rate Source Monitor  ECG Heart Rate 74  Resp 18  Level of Consciousness  Level of Consciousness Alert  Oxygen Therapy  SpO2 99 %  O2 Device Room Air  Pain Assessment  Pain Scale 0-10  Pain Score 0

## 2023-08-12 NOTE — Consult Note (Signed)
 Cardiology Consultation   Patient ID: Luis Hardin MRN: [id number]; DOB: [birth year]  Admit date: 08/12/2023 Date of Consult: 08/12/2023  PCP:  Katrinka Aquas, MD   [facility] Providers Cardiologist:  Diannah SHAUNNA Maywood, MD        Patient Profile: Luis Hardin is a 58 y.o. male with a hx of hypertension, pulmonary nodules and h/o prostate cancer who is being seen 08/12/2023 for the evaluation of NSTEMI at the request of Jesscia Vann DO.  History of Present Illness: [facility]. Salminen follows with [facility]. He was seen by Dr. Lavona on 07/06/23 for hospital follow up to an admission at [facility] on 05/15/23 for chest pain. Patient reported having multiple chest pain episodes with left arm radiation in relation to sexual intercourse [ 1 episode during intercourse, several episodes after intercourse] and one episode of chest pain that awoke him in the morning which is what brought him to [facility]. 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 Echocardiogram LVEF 60-65%, G1 DD and mild to moderately enlarged RV. At that time he had been taking Viagra 100 mg nightly. At discharge his viagra was discontinued. He was started on Lipitor.   At his follow up visit with Dr. Lavona he reported no episodes of chest pain. He began taking Viagra 50 mg nightly.   On the morning of 7/12 patient was having sexual intercourse when he noted a severe chest pain with associated left arm radiation, shortness of breath, diaphoresis, and nausea. This episode lasted about 10 minutes then resolved on its own. He had a lingering mild chest pain that lasted about an hour. He originally presented to [facility] ED. He was given ASA & started on a heparin  gtt. Transferred to [facility]. Hs troponin 43 -> 146-> 160 -> 121 CXR  unremarkable ECG: Sinus with poor R wave progression, non-specific TWI, precordial q waves  Patient denies chest pain with exertion during other strenuous activities including walking, lifting boxes at work. Denies DOE, palpitations, and peripheral edema. During interview he was chest pain free.  Quit using tobacco products 35 years ago Denied EOTH and illicit drug use No family history of MI or sudden cardiac death.   Past Medical History:  Diagnosis Date   Cancer (HCC) 2023   prostate cancer   GERD (gastroesophageal reflux disease)    H/O hiatal hernia    Hyperlipidemia    Hypertension    Inguinal hernia    RIGHT   Tendonitis    KNEES    Past Surgical History:  Procedure Laterality Date   COLONOSCOPY N/A 01/04/2016   Procedure: COLONOSCOPY;  Surgeon: Margo LITTIE Haddock, MD;  Location: [facility];  Service: Endoscopy;  Laterality: N/A;  1:00 PM   COLONOSCOPY WITH PROPOFOL  N/A 08/16/2021   Procedure: COLONOSCOPY WITH PROPOFOL ;  Surgeon: Cindie Carlin POUR, DO;  Location: [facility];  Service: Endoscopy;  Laterality: N/A;  7:30am, asa 2 (needed a monday)   ESOPHAGOGASTRODUODENOSCOPY  11/2007   [place]: ?short segment Barrett's endoscopically but biopsies did not confirm.  Bx eosinophilic esophagitis, acute and chronic inflammation of distal esophagus and gastric cardia   ESOPHAGOGASTRODUODENOSCOPY  03/2003   [place]: ?short segment Barrett's, biopsy showed chronic carditis.    INGUINAL HERNIA REPAIR Right 02/12/2013   Procedure: RIGHT INGUINAL HERNIA REPAIR;  Surgeon: Vicenta DELENA Poli, MD;  Location: [facility] ORS;  Service: General;  Laterality: Right;   INSERTION OF MESH N/A 02/12/2013   Procedure: INSERTION OF MESH;  Surgeon: Vicenta DELENA Poli, MD;  Location: [facility] OR[facility];  Service: General;  Laterality: N/A;   POLYPECTOMY  08/16/2021   Procedure: POLYPECTOMY INTESTINAL;  Surgeon: Cindie Carlin POUR, DO;  Location: [facility];  Service: Endoscopy;;   PROSTATECTOMY  04/2022    ROBOT ASSISTED LAPAROSCOPIC RADICAL PROSTATECTOMY N/A 04/21/2022   Procedure: XI ROBOTIC ASSISTED LAPAROSCOPIC RADICAL PROSTATECTOMY LEVEL 1;  Surgeon: Renda Glance, MD;  Location: [facility] OR[facility];  Service: Urology;  Laterality: N/A;  210 MINUTES NEEDED FRO CASE   SHOULDER SURGERY Left 2009   L SHOULDER   TUMOR REMOVED  1990   LEFT SIDE OF NECK (BENIGN)       Scheduled Meds:  [START ON 08/13/2023] aspirin  EC  81 mg Oral Daily   atorvastatin   20 mg Oral Daily   pantoprazole   40 mg Oral Daily   Continuous Infusions:  heparin  1,300 Units/hr (08/12/23 1008)   PRN Meds: acetaminophen , nitroGLYCERIN , ondansetron  (ZOFRAN ) IV  Allergies:   No Known Allergies  Social History:   Social History   Socioeconomic History   Marital status: Married    Spouse name: Not on file   Number of children: Not on file   Years of education: Not on file   Highest education level: Not on file  Occupational History   Occupation: textiles  Tobacco Use   Smoking status: Former    Current packs/day: 0.00    Average packs/day: 0.3 packs/day for 5.0 years (1.3 ttl pk-yrs)    Types: Cigarettes    Start date: 02/01/1983    Quit date: 02/01/1988    Years since quitting: 35.5   Smokeless tobacco: Never  Vaping Use   Vaping status: Never Used  Substance and Sexual Activity   Alcohol use: No   Drug use: No   Sexual activity: Yes  Other Topics Concern   Not on file  Social History Narrative   Not on file   Social Drivers of Health   Financial Resource Strain: Not on file  Food Insecurity: No Food Insecurity (08/12/2023)   Hunger Vital Sign    Worried About Running Out of Food in the Last Year: Never true    Ran Out of Food in the Last Year: Never true  Transportation Needs: No Transportation Needs (08/12/2023)   PRAPARE - Administrator, Civil Service (Medical): No    Lack of Transportation (Non-Medical): No  Physical Activity: Not on file  Stress: Not on file  Social Connections: Not on file   Intimate Partner Violence: Unknown (08/12/2023)   Humiliation, Afraid, Rape, and Kick questionnaire    Fear of Current or Ex-Partner: No    Emotionally Abused: No    Physically Abused: Not on file    Sexually Abused: Not on file    Family History:    Family History  Problem Relation Age of Onset   Heart disease Mother    Cancer Father        lung   Cancer Brother        skin   Colon polyps Brother    Cancer Paternal Grandfather        mouth   Colon polyps Maternal Uncle      ROS:  Please see the history of present illness.   All other ROS reviewed and negative.     Physical  Exam/Data: Vitals:   08/12/23 1200 08/12/23 1230 08/12/23 1522 08/12/23 1600  BP: 127/78 (!) 120/95 137/88 131/88  Pulse: 74 86 76 81  Resp: 17 (!) 24 18   Temp:   98.5 F (36.9 C)   TempSrc:   Oral   SpO2: 97% 96% 99% 97%  Weight:      Height:        Intake/Output Summary (Last 24 hours) at 08/12/2023 1819 Last data filed at 08/12/2023 1626 Gross per 24 hour  Intake 240 ml  Output --  Net 240 ml      08/12/2023    6:29 AM 07/06/2023    9:16 AM 05/18/2023   12:20 AM  Last 3 Weights  Weight (lbs) 214 lb 8.1 oz 214 lb 6.4 oz 207 lb  Weight (kg) 97.3 kg 97.251 kg 93.895 kg     Body mass index is 31.68 kg/m.  General:  Well nourished, well developed, in no acute distress HEENT: normal Neck: no JVD Vascular: No carotid bruits; Distal pulses 2+ bilaterally Cardiac:  normal S1, S2; RRR; no murmur  Lungs:  clear to auscultation bilaterally, no wheezing, rhonchi or rales  Abd: soft, nontender, no hepatomegaly  Ext: no edema Musculoskeletal:  No deformities, BUE and BLE strength normal and equal Skin: warm and dry  Neuro:  CNs 2-12 intact, no focal abnormalities noted Psych:  Normal affect   EKG:  The EKG was personally reviewed and demonstrates:  See HPI Telemetry:  Telemetry was personally reviewed and demonstrates:  Sinus rhythm HR 80s  Relevant CV Studies: Echocardiogram  05/16/23 IMPRESSIONS     1. No LV thrombus by Definity . Left ventricular ejection fraction, by  estimation, is 60 to 65%. The left ventricle has normal function. The left  ventricle has no regional wall motion abnormalities. Left ventricular  diastolic parameters are consistent  with Grade I diastolic dysfunction (impaired relaxation).   2. Right ventricular systolic function is normal. The right ventricular  size is mild to moderately enlarged. Tricuspid regurgitation signal is  inadequate for assessing PA pressure.   3. The mitral valve is grossly normal. Trivial mitral valve  regurgitation. No evidence of mitral stenosis.   4. The aortic valve has an indeterminant number of cusps. Aortic valve  regurgitation is not visualized. No aortic stenosis is present.   CCTA 05/15/23  Impression 1. No significant extracardiac findings. 2. Scattered small pulmonary nodules, likely benign  Coronary calcium  score is 7.23.   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 Left circumflex originates from left coronary cusp. Right dominance.   Left Anterior Descending Coronary Artery: Originates from the right coronary cusp. Proximal segment patent but courses between the aorta and pulmonary artery, mid segment minimal calcified plaque (0-24%), distal / apical segments patent.   First diagonal branch: Normal caliber, bifurcates into superior and inferior branches, patent.   Left Circumflex Artery: Non-dominant, terminates within the AV groove, gives off 2 obtuse marginal branches, LCX is patent.   First obtuse marginal branch: Patent   Second obtuse marginal branch: Patent   Right Coronary Artery: Dominant.  Patent.    Other findings:   Patent foramen ovale noted.   IMPRESSION: 1.  CT FFR analysis showed no significant stenosis  Laboratory Data: High Sensitivity Troponin:   Recent Labs   Lab 08/12/23 0637 08/12/23 0818 08/12/23 1044 08/12/23 1227  TROPONINIHS 43* 146* 160* 121*     Chemistry Recent Labs  Lab 08/12/23 0637  NA 137  K 4.1  CL 104  CO2 22  GLUCOSE 118*  BUN 11  CREATININE 0.85  CALCIUM  9.1  GFRNONAA >60  ANIONGAP 11    No results for input(s): PROT, ALBUMIN, AST, ALT, ALKPHOS, BILITOT in the last 168 hours. Lipids No results for input(s): CHOL, TRIG, HDL, LABVLDL, LDLCALC, CHOLHDL in the last 168 hours.  Hematology Recent Labs  Lab 08/12/23 0637  WBC 7.4  RBC 4.52  HGB 14.3  HCT 40.0  MCV 88.5  MCH 31.6  MCHC 35.8  RDW 12.4  PLT 293   Thyroid No results for input(s): TSH, FREET4 in the last 168 hours.  BNPNo results for input(s): BNP, PROBNP in the last 168 hours.  DDimer No results for input(s): DDIMER in the last 168 hours.  Radiology/Studies:  DG Chest 2 View Result Date: 08/12/2023 EXAM: 2 VIEW(S) XRAY OF THE CHEST 08/12/2023 07:16:00 AM COMPARISON: 05/15/2023 CLINICAL HISTORY: Left side chest pain that started an hour ago, went down his arm, with nausea and diaphoresis. Uncomfortable pressure 6/10 initially, 1/10 now. Patient stated that he has the same type of pain about a month ago. FINDINGS: LUNGS AND PLEURA: No focal pulmonary opacity. No pulmonary edema. No pleural effusion. No pneumothorax. HEART AND MEDIASTINUM: No acute abnormality of the cardiac and mediastinal silhouettes. BONES AND SOFT TISSUES: No acute osseous abnormality. IMPRESSION: 1. No acute process. 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 He presented to ED after chest pain with intercourse. He is known to have his LAD originating from the RCA cusp with separate ostia. LAD courses through the aorta and pulmonary artery.  Recently restarted daily Viagra Troponin peaked at 160, and  now down trending Stable ECG from prior tracing Currently chest pain free on heparin  gtt  Continue ASA Start metoprolol  tartrate 12.5 mg BID Continue on telemetry, will monitor for arrhythmia given anomalous coronary   He had a recent CCTA that showed non-obstructive coronary disease, which is reassuring. However, given reoccurrence of chest pain, it is unclear if he has obstructive disease vs possible steal syndrome with anomalous coronary. Will manage medically this weekend with definitive plans on Monday as we will discuss with interventional team. He may eventually need to be evaluated by CT surgery. Will make patient npo Sunday at midnight  Dyslipidemia  Lipid panel and Lipoprotein (a) pending Continue lipitor  Hypertension BP: 131/88 Hold PTA lisinopril  in order to start metoprolol , if blood pressure control is needed consider starting amlodipine instead  Patent Foramen Ovale Noted on CCTA.   Per primary  Pulmonary nodules H/o of prostate cancer   Risk Assessment/Risk Scores:    TIMI Risk Score for Unstable Angina or Non-ST Elevation MI:   The patient's TIMI risk score is 4, which indicates a 20% risk of all cause mortality, new or recurrent myocardial infarction or need for urgent revascularization in the next 14 days.     For questions or updates, please contact [facility] Please consult [email or web address] for contact info under    Signed, Leontine LOISE Salen, PA-C  08/12/2023 6:19 PM

## 2023-08-12 NOTE — ED Provider Notes (Signed)
 [place] EMERGENCY DEPARTMENT AT [facility] Provider Note  CSN: [id number] Arrival date & time: 08/12/23 9383  Chief Complaint(s) Chest Pain  HPI Luis Hardin is a 58 y.o. male with past medical history as below, significant for GERD, hypertension hyperlipidemia, prostate cancer who presents to the ED with complaint of chest pain  Patient reports been having chest pain this morning while having sexual intercourse, described a heaviness, pressure sensation left side of his chest rating of left arm.  Diaphoretic, with dry heaving.  Symptoms lasted approximately 5 to 10 minutes.  He is still having some residual aching, heaviness to his left side chest wall but has significant improved since the onset.  Similar prior episode back in April which was admitted, had CT coronary in April which was reassuring.  Past Medical History Past Medical History:  Diagnosis Date   Cancer (HCC) 2023   prostate cancer   GERD (gastroesophageal reflux disease)    H/O hiatal hernia    Hyperlipidemia    Hypertension    Inguinal hernia    RIGHT   Tendonitis    KNEES   Patient Active Problem List   Diagnosis Date Noted   NSTEMI (non-ST elevated myocardial infarction) (HCC) 08/12/2023   Elevated troponin 05/17/2023   Side effect of drug 05/17/2023   Right ventricular enlargement 05/17/2023   Chest pain of uncertain etiology 05/17/2023   Chest pain 05/15/2023   HTN (hypertension) 05/15/2023   Prostate cancer (HCC) 04/21/2022   Colon cancer screening    GERD (gastroesophageal reflux disease) 12/17/2015   Rectal bleeding 12/17/2015   Hemorrhoids 12/17/2015   Right inguinal hernia 01/28/2013   Home Medication(s) Prior to Admission medications   Medication Sig Start Date End Date Taking? Authorizing Provider  acetaminophen  (TYLENOL ) 500 MG tablet Take 1,000 mg by mouth every 6 (six) hours as needed for moderate pain.    [provider]  atorvastatin  (LIPITOR) 20 MG tablet Take  1 tablet (20 mg total) by mouth daily. 05/18/23   Gonfa, Taye T, MD  lisinopril  (ZESTRIL ) 20 MG tablet Take 1 tablet (20 mg total) by mouth daily with breakfast. 05/19/23 08/17/23  Gonfa, Taye T, MD  omeprazole (PRILOSEC) 20 MG capsule Take 20 mg by mouth daily.    [provider]  triamcinolone cream (KENALOG) 0.1 % Apply 1 Application topically 2 (two) times daily as needed (eczema). 02/28/22   [provider]                                                                                                                                    Past Surgical History Past Surgical History:  Procedure Laterality Date   COLONOSCOPY N/A 01/04/2016   Procedure: COLONOSCOPY;  Surgeon: Margo LITTIE Haddock, MD;  [facility];  Service: Endoscopy;  Laterality: N/A;  1:00 PM   COLONOSCOPY WITH PROPOFOL  N/A 08/16/2021   Procedure: COLONOSCOPY WITH PROPOFOL ;  Surgeon:  Cindie Carlin POUR, DO;  Location: [facility];  Service: Endoscopy;  Laterality: N/A;  7:30am, asa 2 (needed a monday)   ESOPHAGOGASTRODUODENOSCOPY  11/2007   Dhiflet: ?short segment Barrett's endoscopically but biopsies did not confirm.  Bx eosinophilic esophagitis, acute and chronic inflammation of distal esophagus and gastric cardia   ESOPHAGOGASTRODUODENOSCOPY  03/2003   Shiflett: ?short segment Barrett's, biopsy showed chronic carditis.    INGUINAL HERNIA REPAIR Right 02/12/2013   Procedure: RIGHT INGUINAL HERNIA REPAIR;  Surgeon: Vicenta DELENA Poli, MD;  Location: [facility];  Service: General;  Laterality: Right;   INSERTION OF MESH N/A 02/12/2013   Procedure: INSERTION OF MESH;  Surgeon: Vicenta DELENA Poli, MD;  Location: [facility];  Service: General;  Laterality: N/A;   POLYPECTOMY  08/16/2021   Procedure: POLYPECTOMY INTESTINAL;  Surgeon: Cindie Carlin POUR, DO;  [facility];  Service: Endoscopy;;   PROSTATECTOMY  04/2022   ROBOT ASSISTED LAPAROSCOPIC RADICAL PROSTATECTOMY N/A 04/21/2022   Procedure: XI  ROBOTIC ASSISTED LAPAROSCOPIC RADICAL PROSTATECTOMY LEVEL 1;  Surgeon: Renda Glance, MD;  Location: [facility];  Service: Urology;  Laterality: N/A;  210 MINUTES NEEDED FRO CASE   SHOULDER SURGERY Left 2009   L SHOULDER   TUMOR REMOVED  1990   LEFT SIDE OF NECK (BENIGN)   Family History Family History  Problem Relation Age of Onset   Heart disease Mother    Cancer Father        lung   Cancer Brother        skin   Colon polyps Brother    Cancer Paternal Grandfather        mouth   Colon polyps Maternal Uncle     Social History Social History   Tobacco Use   Smoking status: Former    Current packs/day: 0.00    Average packs/day: 0.3 packs/day for 5.0 years (1.3 ttl pk-yrs)    Types: Cigarettes    Start date: 02/01/1983    Quit date: 02/01/1988    Years since quitting: 35.5   Smokeless tobacco: Never  Vaping Use   Vaping status: Never Used  Substance Use Topics   Alcohol use: No   Drug use: No   Allergies Patient has no known allergies.  Review of Systems A thorough review of systems was obtained and all systems are negative except as noted in the HPI and PMH.   Physical Exam Vital Signs  I have reviewed the triage vital signs BP (!) 131/99   Pulse 78   Temp 97.7 F (36.5 C)   Resp 17   Ht 5' 9 (1.753 m)   Wt 97.3 kg   SpO2 100%   BMI 31.68 kg/m  Physical Exam Vitals and nursing note reviewed.  Constitutional:      General: He is not in acute distress.    Appearance: He is well-developed. He is obese.  HENT:     Head: Normocephalic and atraumatic.     Right Ear: External ear normal.     Left Ear: External ear normal.     Mouth/Throat:     Mouth: Mucous membranes are moist.  Eyes:     General: No scleral icterus. Cardiovascular:     Rate and Rhythm: Normal rate and regular rhythm.     Pulses: Normal pulses.     Heart sounds: Normal heart sounds.  Pulmonary:     Effort: Pulmonary effort is normal. No respiratory distress.     Breath sounds: Normal  breath sounds.  Abdominal:     General: Abdomen is flat.     Palpations: Abdomen is soft.     Tenderness: There is no abdominal tenderness.  Musculoskeletal:     Cervical back: No rigidity.     Right lower leg: No edema.     Left lower leg: No edema.  Skin:    General: Skin is warm and dry.     Capillary Refill: Capillary refill takes less than 2 seconds.  Neurological:     Mental Status: He is alert.  Psychiatric:        Mood and Affect: Mood normal.        Behavior: Behavior normal.     ED Results and Treatments Labs (all labs ordered are listed, but only abnormal results are displayed) Labs Reviewed  BASIC METABOLIC PANEL WITH GFR - Abnormal; Notable for the following components:      Result Value   Glucose, Bld 118 (*)    All other components within normal limits  TROPONIN I (HIGH SENSITIVITY) - Abnormal; Notable for the following components:   Troponin I (High Sensitivity) 43 (*)    All other components within normal limits  TROPONIN I (HIGH SENSITIVITY) - Abnormal; Notable for the following components:   Troponin I (High Sensitivity) 146 (*)    All other components within normal limits  CBC  HEPARIN  LEVEL (UNFRACTIONATED)  TROPONIN I (HIGH SENSITIVITY)                                                                                                                          Radiology DG Chest 2 View Result Date: 08/12/2023 EXAM: 2 VIEW(S) XRAY OF THE CHEST 08/12/2023 07:16:00 AM COMPARISON: 05/15/2023 CLINICAL HISTORY: Left side chest pain that started an hour ago, went down his arm, with nausea and diaphoresis. Uncomfortable pressure 6/10 initially, 1/10 now. Patient stated that he has the same type of pain about a month ago. FINDINGS: LUNGS AND PLEURA: No focal pulmonary opacity. No pulmonary edema. No pleural effusion. No pneumothorax. HEART AND MEDIASTINUM: No acute abnormality of the cardiac and mediastinal silhouettes. BONES AND SOFT TISSUES: No acute osseous  abnormality. IMPRESSION: 1. No acute process. Electronically signed by: Lonni Necessary MD 08/12/2023 07:24 AM EDT RP Workstation: [id number]    Pertinent labs & imaging results that were available during my care of the patient were reviewed by me and considered in my medical decision making (see MDM for details).  Medications Ordered in ED Medications  heparin  ADULT infusion 100 units/mL (25000 units/250mL) (1,300 Units/hr Intravenous New Bag/Given 08/12/23 1008)  aspirin  chewable tablet 324 mg (324 mg Oral Given 08/12/23 0740)  heparin  bolus via infusion 4,000 Units (4,000 Units Intravenous Bolus from Bag 08/12/23 1009)  Procedures .Critical Care  Performed by: Elnor Jayson LABOR, DO Authorized by: Elnor Jayson LABOR, DO   Critical care provider statement:    Critical care time (minutes):  42   Critical care time was exclusive of:  Separately billable procedures and treating other patients   Critical care was necessary to treat or prevent imminent or life-threatening deterioration of the following conditions:  Cardiac failure   Critical care was time spent personally by me on the following activities:  Development of treatment plan with patient or surrogate, discussions with consultants, evaluation of patient's response to treatment, examination of patient, ordering and review of laboratory studies, ordering and review of radiographic studies, ordering and performing treatments and interventions, pulse oximetry, re-evaluation of patient's condition, review of old charts and obtaining history from patient or surrogate   (including critical care time)  Medical Decision Making / ED Course    Medical Decision Making:    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 The complaint involves an extensive differential diagnosis and also carries with it a high risk of complications and morbidity.  Serious etiology was considered. Ddx includes but is not limited to: Differential includes all life-threatening causes for chest pain. This includes but is not exclusive to acute coronary syndrome, aortic dissection, pulmonary embolism, cardiac tamponade, community-acquired pneumonia, pericarditis, musculoskeletal chest wall pain, etc.   Complete initial physical exam performed, notably the patient was in nad, hds, pain present left chest.    Reviewed and confirmed nursing documentation for past medical history, family history, social history.  Vital signs reviewed.     Chest pain > -admitted 05/15/23 with similar chest pain, pt does feel the pain today is not as bad as it was back in April -ct coronary 05/18/23 no sig stenosis, echo 05/19/23 with LVEF 60-65%, G1DD -f/w Dr Lavona o/p -give asa -trop 43 > 146 - pain ongoing but has improved since the onset, start heparin , EKG w/o stemi, concern for possible NSTEMI, d/w cardiology  Clinical Course as of 08/12/23 1037  Sat Aug 12, 2023  0958 Dr Nancey, okay to stay here, if develops EKG changes or trop continues to elevate would send to [facility] for card eval/cath; o/w ok to stay here [SG]    Clinical Course User Index [SG] Elnor Jayson LABOR, DO     Admit to hospitalist Dr Juvenal               Additional history obtained: -Additional history obtained from spouse -External records from outside source obtained and reviewed including: Chart review including previous notes, labs, imaging, consultation notes including  Prior admit Prior labs Prior imaging/echo Cardiology documentation   Lab Tests: -I ordered, reviewed, and interpreted labs.   The pertinent results include:   Labs Reviewed  BASIC METABOLIC PANEL WITH GFR - Abnormal; Notable for the following  components:      Result Value   Glucose, Bld 118 (*)    All other components within normal limits  TROPONIN I (HIGH SENSITIVITY) - Abnormal; Notable for the following components:   Troponin I (High Sensitivity) 43 (*)    All other components within normal limits  TROPONIN I (HIGH SENSITIVITY) - Abnormal; Notable for the following components:   Troponin I (High Sensitivity) 146 (*)    All other components within normal limits  CBC  HEPARIN  LEVEL (UNFRACTIONATED)  TROPONIN  I (HIGH SENSITIVITY)    Notable for trop +  EKG   EKG Interpretation Date/Time:  Saturday August 12 2023 06:28:10 EDT Ventricular Rate:  79 PR Interval:  170 QRS Duration:  100 QT Interval:  375 QTC Calculation: 430 R Axis:   54  Text Interpretation: Sinus rhythm Low voltage, precordial leads  Confirmed by Elnor Savant ([id number]) on 08/12/2023 9:36:41 AM         Imaging Studies ordered: I ordered imaging studies including cxr I independently visualized the following imaging with scope of interpretation limited to determining acute life threatening conditions related to emergency care; findings noted above I agree with the radiologist interpretation If any imaging was obtained with contrast I closely monitored patient for any possible adverse reaction a/w contrast administration in the emergency department   Medicines ordered and prescription drug management: Meds ordered this encounter  Medications   aspirin  chewable tablet 324 mg   heparin  bolus via infusion 4,000 Units   heparin  ADULT infusion 100 units/mL (25000 units/250mL)   DISCONTD: nitroGLYCERIN  (NITROSTAT ) SL tablet 0.4 mg    -I have reviewed the patients home medicines and have made adjustments as needed   Consultations Obtained: I requested consultation with the dr nancey,  and discussed lab and imaging findings as well as pertinent plan - they recommend: trend trop   Cardiac Monitoring: The patient was maintained on a cardiac monitor.  I  personally viewed and interpreted the cardiac monitored which showed an underlying rhythm of: nsr Continuous pulse oximetry interpreted by myself, 100% on ra.    Social Determinants of Health:  Diagnosis or treatment significantly limited by social determinants of health: former smoker and obesity   Reevaluation: After the interventions noted above, I reevaluated the patient and found that they have improved  Co morbidities that complicate the patient evaluation  Past Medical History:  Diagnosis Date   Cancer (HCC) 2023   prostate cancer   GERD (gastroesophageal reflux disease)    H/O hiatal hernia    Hyperlipidemia    Hypertension    Inguinal hernia    RIGHT   Tendonitis    KNEES      Dispostion: Disposition decision including need for hospitalization was considered, and patient admitted to the hospital.    Final Clinical Impression(s) / ED Diagnoses Final diagnoses:  NSTEMI (non-ST elevated myocardial infarction) (HCC)        Elnor Savant LABOR, DO 08/12/23 1037

## 2023-08-12 NOTE — ED Triage Notes (Signed)
 Pov from home. Cc of left side chest pain that started an hour ago. Says it went down his arm. C/o nausea and diaphoretic with it. Uncomfortable pressure 6/10 initially. 1/10 now

## 2023-08-12 NOTE — H&P (Addendum)
 History and Physical    Luis Hardin FMW:[id number] DOB: April 20, 1965 DOA: 08/12/2023  I have briefly reviewed the patient's prior medical records in [facility] Link  PCP: Katrinka Aquas, MD  Patient coming from: Home via [facility] hospital  Chief Complaint: Chest pain  HPI: Luis Hardin is a 58 y.o. male with medical history significant of prostate cancer, hypertension, hyperlipidemia.  Patient comes from home with chest pain: Left-sided that goes down his left arm.  Denies radiation up into his neck.  Patient did have some nausea and diaphoresis.  Initially pain/pressure was 6 out of 10 but currently down to 1 out of 10.  Patient was exerting himself when pain began (having sexual intercourse with wife).  Patient states he exerts himself every other week but for the last several months has had no chest discomfort.  He did have a similar episode in April 2025 for which he was admitted to [facility] with a CT angio.  Patient does take sildenafil daily.    In the ER, troponin went from 43--->146 in 2 hours, EKG reassuring as well as other laboratory data. Third set of troponins are pending  Review of Systems: As per HPI otherwise 10 point review of systems negative.   Past Medical History:  Diagnosis Date   Cancer (HCC) 2023   prostate cancer   GERD (gastroesophageal reflux disease)    H/O hiatal hernia    Hyperlipidemia    Hypertension    Inguinal hernia    RIGHT   Tendonitis    KNEES    Past Surgical History:  Procedure Laterality Date   COLONOSCOPY N/A 01/04/2016   Procedure: COLONOSCOPY;  Surgeon: Margo LITTIE Haddock, MD;  [facility];  Service: Endoscopy;  Laterality: N/A;  1:00 PM   COLONOSCOPY WITH PROPOFOL  N/A 08/16/2021   Procedure: COLONOSCOPY WITH PROPOFOL ;  Surgeon: Cindie Carlin POUR, DO;  [facility];  Service: Endoscopy;  Laterality: N/A;  7:30am, asa 2 (needed a monday)   ESOPHAGOGASTRODUODENOSCOPY  11/2007   Dhiflet: ?short segment  Barrett's endoscopically but biopsies did not confirm.  Bx eosinophilic esophagitis, acute and chronic inflammation of distal esophagus and gastric cardia   ESOPHAGOGASTRODUODENOSCOPY  03/2003   Shiflett: ?short segment Barrett's, biopsy showed chronic carditis.    INGUINAL HERNIA REPAIR Right 02/12/2013   Procedure: RIGHT INGUINAL HERNIA REPAIR;  Surgeon: Vicenta DELENA Poli, MD;  [facility]: [facility];  Service: General;  Laterality: Right;   INSERTION OF MESH N/A 02/12/2013   Procedure: INSERTION OF MESH;  Surgeon: Vicenta DELENA Poli, MD;  [facility]: [facility];  Service: General;  Laterality: N/A;   POLYPECTOMY  08/16/2021   Procedure: POLYPECTOMY INTESTINAL;  Surgeon: Cindie Carlin POUR, DO;  [facility];  Service: Endoscopy;;   PROSTATECTOMY  04/2022   ROBOT ASSISTED LAPAROSCOPIC RADICAL PROSTATECTOMY N/A 04/21/2022   Procedure: XI ROBOTIC ASSISTED LAPAROSCOPIC RADICAL PROSTATECTOMY LEVEL 1;  Surgeon: Renda Glance, MD;  Location: [facility];  Service: Urology;  Laterality: N/A;  210 MINUTES NEEDED FRO CASE   SHOULDER SURGERY Left 2009   L SHOULDER   TUMOR REMOVED  1990   LEFT SIDE OF NECK (BENIGN)     reports that he quit smoking about 35 years ago. His smoking use included cigarettes. He started smoking about 40 years ago. He has a 1.3 pack-year smoking history. He has never used smokeless tobacco. He reports that he does not drink alcohol and does not use drugs.  No Known Allergies  Family  History  Problem Relation Age of Onset   Heart disease Mother    Cancer Father        lung   Cancer Brother        skin   Colon polyps Brother    Cancer Paternal Grandfather        mouth   Colon polyps Maternal Uncle     Prior to Admission medications   Medication Sig Start Date End Date Taking? Authorizing Provider  acetaminophen  (TYLENOL ) 500 MG tablet Take 1,000 mg by mouth every 6 (six) hours as needed for moderate pain.    [provider]  atorvastatin  (LIPITOR) 20 MG  tablet Take 1 tablet (20 mg total) by mouth daily. 05/18/23   Gonfa, Taye T, MD  lisinopril  (ZESTRIL ) 20 MG tablet Take 1 tablet (20 mg total) by mouth daily with breakfast. 05/19/23 08/17/23  Gonfa, Taye T, MD  omeprazole (PRILOSEC) 20 MG capsule Take 20 mg by mouth daily.    [provider]  triamcinolone cream (KENALOG) 0.1 % Apply 1 Application topically 2 (two) times daily as needed (eczema). 02/28/22   [provider]    Physical Exam: Vitals:   08/12/23 0630 08/12/23 0645 08/12/23 1015 08/12/23 1030  BP: (!) 145/93 120/82 (!) 131/99 138/84  Pulse: 77 74 78 68  Resp: 19 (!) 21 17 (!) 21  Temp:      SpO2: 98% 97% 100% 98%  Weight:      Height:         General: Appearance:    Obese male in no acute distress     Lungs:     Clear to auscultation bilaterally, respirations unlabored  Heart:    Normal heart rate. Normal rhythm. No murmurs, rubs, or gallops.   MS:   All extremities are intact.   Neurologic:   Awake, alert, oriented x 3. No apparent focal neurological           defect.      Labs on Admission: I have personally reviewed following labs and imaging studies  CBC: Recent Labs  Lab 08/12/23 0637  WBC 7.4  HGB 14.3  HCT 40.0  MCV 88.5  PLT 293   Basic Metabolic Panel: Recent Labs  Lab 08/12/23 0637  NA 137  K 4.1  CL 104  CO2 22  GLUCOSE 118*  BUN 11  CREATININE 0.85  CALCIUM  9.1   GFR: Estimated Creatinine Clearance: 108.9 mL/min (by C-G formula based on SCr of 0.85 mg/dL). Liver Function Tests: No results for input(s): AST, ALT, ALKPHOS, BILITOT, PROT, ALBUMIN in the last 168 hours. No results for input(s): LIPASE, AMYLASE in the last 168 hours. No results for input(s): AMMONIA in the last 168 hours. Coagulation Profile: No results for input(s): INR, PROTIME in the last 168 hours. Cardiac Enzymes: No results for input(s): CKTOTAL, CKMB, CKMBINDEX, TROPONINI in the last 168 hours. BNP (last 3  results) No results for input(s): PROBNP in the last 8760 hours. HbA1C: No results for input(s): HGBA1C in the last 72 hours. CBG: No results for input(s): GLUCAP in the last 168 hours. Lipid Profile: No results for input(s): CHOL, HDL, LDLCALC, TRIG, CHOLHDL, LDLDIRECT in the last 72 hours. Thyroid Function Tests: No results for input(s): TSH, T4TOTAL, FREET4, T3FREE, THYROIDAB in the last 72 hours. Anemia Panel: No results for input(s): VITAMINB12, FOLATE, FERRITIN, TIBC, IRON, RETICCTPCT in the last 72 hours. Urine analysis:    Component Value Date/Time   APPEARANCEUR Clear 02/22/2022 1303   GLUCOSEU  Negative 02/22/2022 1303   BILIRUBINUR Negative 02/22/2022 1303   PROTEINUR Negative 02/22/2022 1303   NITRITE Negative 02/22/2022 1303   LEUKOCYTESUR Negative 02/22/2022 1303     Radiological Exams on Admission: DG Chest 2 View Result Date: 08/12/2023 EXAM: 2 VIEW(S) XRAY OF THE CHEST 08/12/2023 07:16:00 AM COMPARISON: 05/15/2023 CLINICAL HISTORY: Left side chest pain that started an hour ago, went down his arm, with nausea and diaphoresis. Uncomfortable pressure 6/10 initially, 1/10 now. Patient stated that he has the same type of pain about a month ago. FINDINGS: LUNGS AND PLEURA: No focal pulmonary opacity. No pulmonary edema. No pleural effusion. No pneumothorax. HEART AND MEDIASTINUM: No acute abnormality of the cardiac and mediastinal silhouettes. BONES AND SOFT TISSUES: No acute osseous abnormality. IMPRESSION: 1. No acute process. Electronically signed by: Lonni Necessary MD 08/12/2023 07:24 AM EDT RP Workstation: [id number]    EKG: Independently reviewed.  Sinus rhythm, low voltage  Assessment/Plan Principal Problem:   NSTEMI (non-ST elevated myocardial infarction) (HCC)    Chest pain with elevated troponins - Troponins have tripled in 2 hours so we will order second set - Heparin  drip started in the ER - Discussed with  cardiology: No current plan for intervention but would continue to trend troponin.  Agreeable to transfer to [facility] for closer monitoring in case intervention is needed as any been does not have cardiology coverage on the weekends  HTN - Resume home meds as needed  Hyperlipidemia - On Lipitor - Check fasting lipid panel  History of prostate cancer - Follows outpatient with urology   DVT prophylaxis: Heparin  drip Code Status: Full Family Communication: At bedside Disposition Plan: Transfer to [facility] Consults called: Discussed with cardiology, they will need to be renotified once patient arrives     Harlene RAYMOND Bowl [facility]   How to contact the [facility] Attending or Consulting provider 7A - 7P or covering provider during after hours 7P -7A, for this patient?  Check the care team in [facility] and look for a) attending/consulting [facility] provider listed and b) the [facility] team listed Log into [email or web address] and use [place]'s universal password to access. If you do not have the password, please contact the hospital operator. Locate the [facility] provider you are looking for under [facility] and page to a number that you can be directly reached. If you still have difficulty reaching the provider, please page the [facility] (Director on Call) for the Hospitalists listed on amion for assistance.  08/12/2023, 10:56 AM

## 2023-08-12 NOTE — Plan of Care (Signed)
  Problem: Cardiac: Goal: Ability to achieve and maintain adequate cardiovascular perfusion will improve Outcome: Progressing   Problem: Activity: Goal: Ability to tolerate increased activity will improve Outcome: Progressing   Problem: Clinical Measurements: Goal: Diagnostic test results will improve Outcome: Progressing   Problem: Clinical Measurements: Goal: Cardiovascular complication will be avoided Outcome: Progressing   Problem: Clinical Measurements: Goal: Will remain free from infection Outcome: Progressing   Problem: Activity: Goal: Risk for activity intolerance will decrease Outcome: Progressing   Problem: Pain Managment: Goal: General experience of comfort will improve and/or be controlled Outcome: Progressing   Problem: Skin Integrity: Goal: Risk for impaired skin integrity will decrease Outcome: Progressing

## 2023-08-13 ENCOUNTER — Inpatient Hospital Stay (HOSPITAL_COMMUNITY)

## 2023-08-13 DIAGNOSIS — I214 Non-ST elevation (NSTEMI) myocardial infarction: Secondary | ICD-10-CM | POA: Diagnosis not present

## 2023-08-13 DIAGNOSIS — R079 Chest pain, unspecified: Secondary | ICD-10-CM

## 2023-08-13 LAB — BASIC METABOLIC PANEL WITH GFR
Anion gap: 9 (ref 5–15)
BUN: 12 mg/dL (ref 6–20)
CO2: 21 mmol/L — ABNORMAL LOW (ref 22–32)
Calcium: 8.8 mg/dL — ABNORMAL LOW (ref 8.9–10.3)
Chloride: 106 mmol/L (ref 98–111)
Creatinine, Ser: 0.95 mg/dL (ref 0.61–1.24)
GFR, Estimated: >60 mL/min (ref >60)
Glucose, Bld: 124 mg/dL — ABNORMAL HIGH (ref 70–99)
Potassium: 4.4 mmol/L (ref 3.5–5.1)
Sodium: 136 mmol/L (ref 135–145)

## 2023-08-13 LAB — ECHOCARDIOGRAM COMPLETE
Area-P 1/2: 3.65 cm2
Height: 69 in
S' Lateral: 2.8 cm
Weight: 3297.6 [oz_av]

## 2023-08-13 LAB — HEPARIN LEVEL (UNFRACTIONATED)
Heparin Unfractionated: 0.37 [IU]/mL (ref 0.30–0.70)
Heparin Unfractionated: 0.34 [IU]/mL (ref 0.30–0.70)

## 2023-08-13 LAB — LIPID PANEL
Cholesterol: 91 mg/dL (ref 0–200)
HDL: 35 mg/dL — ABNORMAL LOW (ref >40)
LDL Cholesterol: 40 mg/dL (ref 0–99)
Total CHOL/HDL Ratio: 2.6 ratio
Triglycerides: 81 mg/dL (ref <150)
VLDL: 16 mg/dL (ref 0–40)

## 2023-08-13 LAB — CBC
HCT: 39.1 % (ref 39.0–52.0)
Hemoglobin: 13.5 g/dL (ref 13.0–17.0)
MCH: 31 pg (ref 26.0–34.0)
MCHC: 34.5 g/dL (ref 30.0–36.0)
MCV: 89.9 fL (ref 80.0–100.0)
Platelets: 289 K/uL (ref 150–400)
RBC: 4.35 MIL/uL (ref 4.22–5.81)
RDW: 12.4 % (ref 11.5–15.5)
WBC: 8.3 K/uL (ref 4.0–10.5)
nRBC: 0 % (ref 0.0–0.2)

## 2023-08-13 MED ORDER — SODIUM CHLORIDE 0.9 % WEIGHT BASED INFUSION
3.0000 mL/kg/h | INTRAVENOUS | Status: AC
  Administered 2023-08-14: 3 mL/kg/h via INTRAVENOUS

## 2023-08-13 MED ORDER — SODIUM CHLORIDE 0.9 % WEIGHT BASED INFUSION
1.0000 mL/kg/h | INTRAVENOUS | Status: DC
  Administered 2023-08-14: 1 mL/kg/h via INTRAVENOUS

## 2023-08-13 MED ORDER — ASPIRIN 81 MG PO CHEW
81.0000 mg | CHEWABLE_TABLET | ORAL | Status: AC
  Administered 2023-08-14: 81 mg via ORAL
  Filled 2023-08-13: qty 1

## 2023-08-13 NOTE — Plan of Care (Signed)

## 2023-08-13 NOTE — Progress Notes (Signed)
 PHARMACY - ANTICOAGULATION CONSULT NOTE  Pharmacy Consult for IV heparin  Indication: chest pain/ACS  No Known Allergies  Patient Measurements: Height: 5' 9 (175.3 cm) Weight: 93.5 kg (206 lb 1.6 oz) IBW/kg (Calculated) : 70.7 HEPARIN  DW (KG): 91.1  Vital Signs: Temp: 97.8 F (36.6 C) (07/13 1106) Temp Source: Oral (07/13 1106) BP: 123/82 (07/13 1106) Pulse Rate: 107 (07/13 1106)  Labs: Recent Labs    08/12/23 0637 08/12/23 0818 08/12/23 1044 08/12/23 1227 08/13/23 0416 08/13/23 1054  HGB 14.3  --   --   --  13.5  --   HCT 40.0  --   --   --  39.1  --   PLT 293  --   --   --  289  --   HEPARINUNFRC  --   --   --   --  0.37 0.34  CREATININE 0.85  --   --   --  0.95  --   TROPONINIHS 43* 146* 160* 121*  --   --     Estimated Creatinine Clearance: 95.7 mL/min (by C-G formula based on SCr of 0.95 mg/dL).   Medical History: Past Medical History:  Diagnosis Date   Cancer (HCC) 2023   prostate cancer   GERD (gastroesophageal reflux disease)    H/O hiatal hernia    Hyperlipidemia    Hypertension    Inguinal hernia    RIGHT   Tendonitis    KNEES    Medications:  Scheduled:   aspirin  EC  81 mg Oral Daily   atorvastatin   20 mg Oral Daily   metoprolol  tartrate  12.5 mg Oral BID   pantoprazole   40 mg Oral Daily   Infusions:   heparin  1,300 Units/hr (08/13/23 0258)    Assessment: Pt is a 58 yo male transferred from [facility] on IV heparin  for NSTEMI. Pt is therapeutic with anti-Xa 0.37 on heparin  1300 units/hr. PLT WNL 289. Hgb WNL 13.5. Confirmation heparin  level 7/13 1000 remains therapeutic (0.37). Will proceed with daily heparin  level monitoring.  Goal of Therapy:  Heparin  level 0.3-0.7 units/ml Monitor platelets by anticoagulation protocol: Yes   Plan:  Continue heparin  1300 units/hr Monitor daily heparin  levels Continue to monitor H&H and platelets  Izetta Carl, PharmD PGY1 Pharmacy Resident [facility]  08/13/2023 1:27 PM

## 2023-08-13 NOTE — Progress Notes (Signed)
 Progress Note  Patient Name: Luis Hardin Date of Encounter: 08/13/2023  Primary Cardiologist: Vishnu P Mallipeddi, MD   Subjective   No complaints. Reports he feels well. No recurrence of chest pain.  He notes today that though he has had only 2 ER presentations for chest pain, he has had multiple additional episodes.  The majority these occurred short after taking Viagra and during sexual activity, but if you have occurred the following morning, many hours after Viagra.  Inpatient Medications    Scheduled Meds:  aspirin  EC  81 mg Oral Daily   atorvastatin   20 mg Oral Daily   metoprolol  tartrate  12.5 mg Oral BID   pantoprazole   40 mg Oral Daily   Continuous Infusions:  heparin  1,300 Units/hr (08/13/23 0258)   PRN Meds: acetaminophen , nitroGLYCERIN , ondansetron  (ZOFRAN ) IV   Vital Signs    Vitals:   08/12/23 1931 08/12/23 2300 08/13/23 0251 08/13/23 0750  BP: 115/69 102/66 110/68 124/74  Pulse: 85 68 69 72  Resp: 18 18 18 16   Temp: 97.6 F (36.4 C) 97.9 F (36.6 C) 97.7 F (36.5 C) 98 F (36.7 C)  TempSrc: Oral Oral Oral Oral  SpO2: 99% 98% 97% 99%  Weight:   93.5 kg   Height:        Intake/Output Summary (Last 24 hours) at 08/13/2023 1001 Last data filed at 08/12/2023 1626 Gross per 24 hour  Intake 240 ml  Output --  Net 240 ml   Filed Weights   08/12/23 0629 08/13/23 0251  Weight: 97.3 kg 93.5 kg    Telemetry    Sinus rhythm - Personally Reviewed  ECG    Sinus rhythm - Personally Reviewed  Physical Exam   GEN: No acute distress.   Neck: No JVD Cardiac: RRR, no murmurs, rubs, or gallops.  Respiratory: Clear to auscultation bilaterally. GI: Soft, nontender, non-distended  MS: No edema; No deformity. Neuro:  Nonfocal  Psych: Normal affect   Labs    Chemistry Recent Labs  Lab 08/12/23 0637 08/13/23 0416  NA 137 136  K 4.1 4.4  CL 104 106  CO2 22 21*  GLUCOSE 118* 124*  BUN 11 12  CREATININE 0.85 0.95  CALCIUM  9.1 8.8*   GFRNONAA >60 >60  ANIONGAP 11 9     Hematology Recent Labs  Lab 08/12/23 0637 08/13/23 0416  WBC 7.4 8.3  RBC 4.52 4.35  HGB 14.3 13.5  HCT 40.0 39.1  MCV 88.5 89.9  MCH 31.6 31.0  MCHC 35.8 34.5  RDW 12.4 12.4  PLT 293 289    Cardiac EnzymesNo results for input(s): TROPONINI in the last 168 hours. No results for input(s): TROPIPOC in the last 168 hours.   BNPNo results for input(s): BNP, PROBNP in the last 168 hours.   DDimer No results for input(s): DDIMER in the last 168 hours.   Summary of Pertinent studies    TTE: pending  Cardiac cath: ordered  Imaging: CXR - no acute process  Labs: TnI peak 160  Patient Profile     58 y.o. male with a history of anomalous coronary artery, hypertension, pulmonary nodules, prior prostate cancer admitted for recurrence of chest pain during sexual activity, recent CCTA with nonobstructive CAD.  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 We will prepare for cath on Monday; will have interventional cardiologists weigh in Monday Continue heparin  for 48 hours Metoprolol  started this admit, continue ASA 81, atorvastatin    Dyslipidemia Continue ASA 81 and atorvastatin   Hypertension BP controlled on current regimen Lisinopril  held, metoprolol  started on admit    For questions or updates, please contact [facility] Please consult [email or web address] for contact info under Cardiology/STEMI.      Signed, Eulas FORBES Furbish, MD 08/13/2023, 10:01 AM

## 2023-08-13 NOTE — Progress Notes (Signed)
 Progress Note    Luis Hardin  FMW:[id number] DOB: Apr 29, 1965  DOA: 08/12/2023 PCP: Katrinka Aquas, MD      Brief Narrative:    Medical records reviewed and are as summarized below:  Luis Hardin is a 58 y.o. male  with medical history significant of prostate cancer, hypertension, hyperlipidemia, who presented to the hospital with chest pain that radiated down his left arm and up into his neck.  He also had some nausea and diaphoresis.  He developed the pain while exerting himself (-sexual intercourse with his wife).   He did have a similar episode in April 2025 for which he was admitted to [facility] with a CT angio.  Patient does take sildenafil daily.   Troponins were elevated.  Troponin went from 43-146.  He was admitted to [facility] for NSTEMI.  He was subsequently transferred to [facility] for further management.    Assessment/Plan:   Principal Problem:   NSTEMI (non-ST elevated myocardial infarction) (HCC)    Body mass index is 30.44 kg/m.  (Class I obesity)   Acute NSTEMI: Troponin went from 43 and peaked at 160.  Down to 121.  Continue IV heparin  drip and monitor heparin  level per protocol.  Continue metoprolol , aspirin  and Lipitor.  Follow-up with cardiologist.  Cardiologist contemplating cardiac catheterization tomorrow. 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 Yes; Fluid consistency: Thin  Diet effective now                            Consultants: Cardiologist  Procedures: None    Medications:    aspirin  EC  81 mg Oral Daily   atorvastatin   20 mg Oral Daily   metoprolol  tartrate  12.5 mg Oral BID   pantoprazole   40 mg Oral Daily   Continuous Infusions:  heparin  1,300 Units/hr (08/13/23 0258)      Anti-infectives (From admission, onward)    None              Family Communication/Anticipated D/C date and plan/Code Status   DVT prophylaxis: SCDs Start: 08/12/23 1055     Code Status: Full Code  Family Communication: Plan discussed with his wife at the bedside Disposition Plan: Plan to discharge home   Status is: Inpatient Remains inpatient appropriate because: Acute NSTEMI       Subjective:   Interval events noted.  He complains of shortness of breath with minimal exertion, even in bed.  No chest pain.  His wife is at the bedside.  Objective:    Vitals:   08/12/23 2300 08/13/23 0251 08/13/23 0750 08/13/23 1106  BP: 102/66 110/68 124/74 123/82  Pulse: 68 69 72 (!) 107  Resp: 18 18 16 17   Temp: 97.9 F (36.6 C) 97.7 F (36.5 C) 98 F (36.7 C) 97.8 F (36.6 C)  TempSrc: Oral Oral Oral Oral  SpO2: 98% 97% 99% 90%  Weight:  93.5 kg    Height:       No data found.   Intake/Output Summary (Last 24 hours) at 08/13/2023 1607 Last data filed at 08/13/2023 1421 Gross per 24 hour  Intake 240 ml  Output 100 ml  Net 140 ml  Filed Weights   08/12/23 0629 08/13/23 0251  Weight: 97.3 kg 93.5 kg    Exam:  GEN: NAD SKIN: Warm and dry EYES: No pallor or icterus ENT: MMM CV: RRR PULM: CTA B ABD: soft, ND, NT, +BS CNS: AAO x 3, non focal EXT: No edema or tenderness        Data Reviewed:   I have personally reviewed following labs and imaging studies:  Labs: Labs show the following:   Basic Metabolic Panel: Recent Labs  Lab 08/12/23 0637 08/13/23 0416  NA 137 136  K 4.1 4.4  CL 104 106  CO2 22 21*  GLUCOSE 118* 124*  BUN 11 12  CREATININE 0.85 0.95  CALCIUM  9.1 8.8*   GFR Estimated Creatinine Clearance: 95.7 mL/min (by C-G formula based on SCr of 0.95 mg/dL). Liver Function Tests: No results for input(s): AST, ALT, ALKPHOS, BILITOT, PROT, ALBUMIN in the last 168 hours. No results for input(s): LIPASE,  AMYLASE in the last 168 hours. No results for input(s): AMMONIA in the last 168 hours. Coagulation profile No results for input(s): INR, PROTIME in the last 168 hours.  CBC: Recent Labs  Lab 08/12/23 0637 08/13/23 0416  WBC 7.4 8.3  HGB 14.3 13.5  HCT 40.0 39.1  MCV 88.5 89.9  PLT 293 289   Cardiac Enzymes: No results for input(s): CKTOTAL, CKMB, CKMBINDEX, TROPONINI in the last 168 hours. BNP (last 3 results) No results for input(s): PROBNP in the last 8760 hours. CBG: No results for input(s): GLUCAP in the last 168 hours. D-Dimer: No results for input(s): DDIMER in the last 72 hours. Hgb A1c: No results for input(s): HGBA1C in the last 72 hours. Lipid Profile: Recent Labs    08/13/23 0416  CHOL 91  HDL 35*  LDLCALC 40  TRIG 81  CHOLHDL 2.6   Thyroid function studies: No results for input(s): TSH, T4TOTAL, T3FREE, THYROIDAB in the last 72 hours.  Invalid input(s): FREET3 Anemia work up: No results for input(s): VITAMINB12, FOLATE, FERRITIN, TIBC, IRON, RETICCTPCT in the last 72 hours. Sepsis Labs: Recent Labs  Lab 08/12/23 0637 08/13/23 0416  WBC 7.4 8.3    Microbiology No results found for this or any previous visit (from the past 240 hours).  Procedures and diagnostic studies:  ECHOCARDIOGRAM COMPLETE Result Date: 08/13/2023    ECHOCARDIOGRAM REPORT   Patient Name:   Luis Hardin Date of Exam: 08/13/2023 Medical Rec #:  [id number]        Height:       69.0 in Accession #:    [id number]       Weight:       206.1 lb Date of Birth:  1965-09-27        BSA:          2.093 m Patient Age:    58 years         BP:           124/74 mmHg Patient Gender: M                HR:           69 bpm. Exam Location:  Inpatient Procedure: 2D Echo, Cardiac Doppler and Color Doppler (Both Spectral and Color            Flow Doppler were utilized during procedure). Indications:    Chest Pain  History:        Patient has prior history of  Echocardiogram examinations, most  recent 05/16/2023. Signs/Symptoms:Chest Pain; Risk                 Factors:Hypertension.  Sonographer:    Philomena Daring Referring Phys: JESSICA U VANN IMPRESSIONS  1. Left ventricular ejection fraction, by estimation, is 60 to 65%. The left ventricle has normal function. The left ventricle has no regional wall motion abnormalities. There is mild left ventricular hypertrophy. Left ventricular diastolic parameters were normal.  2. Right ventricular systolic function is normal. The right ventricular size is normal. Tricuspid regurgitation signal is inadequate for assessing PA pressure.  3. The mitral valve is grossly normal. Trivial mitral valve regurgitation. No evidence of mitral stenosis.  4. The aortic valve is grossly normal. There is mild thickening of the aortic valve. Aortic valve regurgitation is trivial. No aortic stenosis is present.  5. The inferior vena cava is normal in size with greater than 50% respiratory variability, suggesting right atrial pressure of 3 mmHg. FINDINGS  Left Ventricle: Left ventricular ejection fraction, by estimation, is 60 to 65%. The left ventricle has normal function. The left ventricle has no regional wall motion abnormalities. The left ventricular internal cavity size was normal in size. There is  mild left ventricular hypertrophy. Left ventricular diastolic parameters were normal. Right Ventricle: The right ventricular size is normal. No increase in right ventricular wall thickness. Right ventricular systolic function is normal. Tricuspid regurgitation signal is inadequate for assessing PA pressure. Left Atrium: Left atrial size was normal in size. Right Atrium: Right atrial size was normal in size. Pericardium: There is no evidence of pericardial effusion. Presence of epicardial fat layer. Mitral Valve: The mitral valve is grossly normal. Trivial mitral valve regurgitation. No evidence of mitral valve stenosis. Tricuspid Valve:  The tricuspid valve is normal in structure. Tricuspid valve regurgitation is not demonstrated. No evidence of tricuspid stenosis. Aortic Valve: The aortic valve is grossly normal. There is mild thickening of the aortic valve. Aortic valve regurgitation is trivial. No aortic stenosis is present. Pulmonic Valve: The pulmonic valve was normal in structure. Pulmonic valve regurgitation is trivial. No evidence of pulmonic stenosis. Aorta: The aortic root is normal in size and structure. Venous: The inferior vena cava is normal in size with greater than 50% respiratory variability, suggesting right atrial pressure of 3 mmHg. IAS/Shunts: No atrial level shunt detected by color flow Doppler.  LEFT VENTRICLE PLAX 2D LVIDd:         4.90 cm   Diastology LVIDs:         2.80 cm   LV e' medial:    10.60 cm/s LV PW:         1.00 cm   LV E/e' medial:  8.0 LV IVS:        1.00 cm   LV e' lateral:   12.50 cm/s LVOT diam:     2.30 cm   LV E/e' lateral: 6.8 LV SV:         80 LV SV Index:   38 LVOT Area:     4.15 cm  RIGHT VENTRICLE             IVC RV S prime:     13.60 cm/s  IVC diam: 1.20 cm TAPSE (M-mode): 2.4 cm LEFT ATRIUM             Index        RIGHT ATRIUM           Index LA diam:        3.70 cm  1.77 cm/m   RA Area:     18.20 cm LA Vol (A2C):   48.5 ml 23.18 ml/m  RA Volume:   49.80 ml  23.80 ml/m LA Vol (A4C):   52.6 ml 25.14 ml/m LA Biplane Vol: 50.4 ml 24.09 ml/m  AORTIC VALVE LVOT Vmax:   103.00 cm/s LVOT Vmean:  64.500 cm/s LVOT VTI:    0.192 m  AORTA Ao Root diam: 3.30 cm Ao Asc diam:  3.30 cm MITRAL VALVE MV Area (PHT): 3.65 cm    SHUNTS MV Decel Time: 208 msec    Systemic VTI:  0.19 m MV E velocity: 84.40 cm/s  Systemic Diam: 2.30 cm MV A velocity: 83.30 cm/s MV E/A ratio:  1.01 Soyla Merck MD Electronically signed by Soyla Merck MD Signature Date/Time: 08/13/2023/11:30:29 AM    Final    DG Chest 2 View Result Date: 08/12/2023 EXAM: 2 VIEW(S) XRAY OF THE CHEST 08/12/2023 07:16:00 AM COMPARISON:  05/15/2023 CLINICAL HISTORY: Left side chest pain that started an hour ago, went down his arm, with nausea and diaphoresis. Uncomfortable pressure 6/10 initially, 1/10 now. Patient stated that he has the same type of pain about a month ago. FINDINGS: LUNGS AND PLEURA: No focal pulmonary opacity. No pulmonary edema. No pleural effusion. No pneumothorax. HEART AND MEDIASTINUM: No acute abnormality of the cardiac and mediastinal silhouettes. BONES AND SOFT TISSUES: No acute osseous abnormality. IMPRESSION: 1. No acute process. Electronically signed by: Lonni Necessary MD 08/12/2023 07:24 AM EDT RP Workstation: [id number]               LOS: 1 day   Leland Raver  [facility]   Pager on [email or web address]. If 7PM-7AM, please contact night-coverage at [email or web address]     08/13/2023, 4:07 PM

## 2023-08-13 NOTE — Progress Notes (Signed)
 PHARMACY - ANTICOAGULATION CONSULT NOTE  Pharmacy Consult for IV heparin  Indication: chest pain/ACS  No Known Allergies  Patient Measurements: Height: 5' 9 (175.3 cm) Weight: 93.5 kg (206 lb 1.6 oz) IBW/kg (Calculated) : 70.7 HEPARIN  DW (KG): 91.1  Vital Signs: Temp: 98 F (36.7 C) (07/13 0750) Temp Source: Oral (07/13 0750) BP: 124/74 (07/13 0750) Pulse Rate: 72 (07/13 0750)  Labs: Recent Labs    08/12/23 0637 08/12/23 0818 08/12/23 1044 08/12/23 1227 08/13/23 0416  HGB 14.3  --   --   --  13.5  HCT 40.0  --   --   --  39.1  PLT 293  --   --   --  289  HEPARINUNFRC  --   --   --   --  0.37  CREATININE 0.85  --   --   --  0.95  TROPONINIHS 43* 146* 160* 121*  --     Estimated Creatinine Clearance: 95.7 mL/min (by C-G formula based on SCr of 0.95 mg/dL).   Medical History: Past Medical History:  Diagnosis Date   Cancer (HCC) 2023   prostate cancer   GERD (gastroesophageal reflux disease)    H/O hiatal hernia    Hyperlipidemia    Hypertension    Inguinal hernia    RIGHT   Tendonitis    KNEES    Medications:  Scheduled:   aspirin  EC  81 mg Oral Daily   atorvastatin   20 mg Oral Daily   metoprolol  tartrate  12.5 mg Oral BID   pantoprazole   40 mg Oral Daily   Infusions:   heparin  1,300 Units/hr (08/13/23 0258)    Assessment: Pt is a 58 yo male transferred from [facility] on IV heparin  for NSTEMI. Pt is therapeutic with anti-Xa 0.37 on heparin  1300 units/hr. PLT WNL 289. Hgb WNL 13.5.  Goal of Therapy:  Heparin  level 0.3-0.7 units/ml Monitor platelets by anticoagulation protocol: Yes   Plan:  Continue heparin  1300 units/hr Order confirmatory anti-Xa level at 1000. If this level is therapeutic, will proceed with daily anti-Xa levels while on heparin . Continue to monitor H&H and platelets  Izetta Carl, PharmD PGY1 Pharmacy Resident [facility]  08/13/2023 9:51 AM

## 2023-08-14 ENCOUNTER — Encounter (HOSPITAL_COMMUNITY)
Admission: EM | Disposition: A | Discharge status: Home / Self Care | Payer: Self-pay | Source: Home / Self Care | Attending: Internal Medicine

## 2023-08-14 DIAGNOSIS — I214 Non-ST elevation (NSTEMI) myocardial infarction: Secondary | ICD-10-CM | POA: Diagnosis not present

## 2023-08-14 DIAGNOSIS — I10 Essential (primary) hypertension: Secondary | ICD-10-CM

## 2023-08-14 DIAGNOSIS — E785 Hyperlipidemia, unspecified: Secondary | ICD-10-CM

## 2023-08-14 HISTORY — PX: LEFT HEART CATH AND CORONARY ANGIOGRAPHY: CATH118249

## 2023-08-14 LAB — CBC
HCT: 38.3 % — ABNORMAL LOW (ref 39.0–52.0)
Hemoglobin: 13.4 g/dL (ref 13.0–17.0)
MCH: 31.2 pg (ref 26.0–34.0)
MCHC: 35 g/dL (ref 30.0–36.0)
MCV: 89.1 fL (ref 80.0–100.0)
Platelets: 264 K/uL (ref 150–400)
RBC: 4.3 MIL/uL (ref 4.22–5.81)
RDW: 12.4 % (ref 11.5–15.5)
WBC: 7.1 K/uL (ref 4.0–10.5)
nRBC: 0 % (ref 0.0–0.2)

## 2023-08-14 LAB — BASIC METABOLIC PANEL WITH GFR
Anion gap: 8 (ref 5–15)
BUN: 10 mg/dL (ref 6–20)
CO2: 23 mmol/L (ref 22–32)
Calcium: 9 mg/dL (ref 8.9–10.3)
Chloride: 106 mmol/L (ref 98–111)
Creatinine, Ser: 1 mg/dL (ref 0.61–1.24)
GFR, Estimated: >60 mL/min (ref >60)
Glucose, Bld: 125 mg/dL — ABNORMAL HIGH (ref 70–99)
Potassium: 4.6 mmol/L (ref 3.5–5.1)
Sodium: 137 mmol/L (ref 135–145)

## 2023-08-14 LAB — HEPARIN LEVEL (UNFRACTIONATED): Heparin Unfractionated: 0.5 [IU]/mL (ref 0.30–0.70)

## 2023-08-14 SURGERY — LEFT HEART CATH AND CORONARY ANGIOGRAPHY
Anesthesia: LOCAL

## 2023-08-14 MED ORDER — HEPARIN SODIUM (PORCINE) 1000 UNIT/ML IJ SOLN
INTRAMUSCULAR | Status: AC
Start: 2023-08-14 — End: 2023-08-14
  Filled 2023-08-14: qty 10

## 2023-08-14 MED ORDER — FENTANYL CITRATE (PF) 100 MCG/2ML IJ SOLN
INTRAMUSCULAR | Status: AC
  Filled 2023-08-14: qty 2

## 2023-08-14 MED ORDER — SODIUM CHLORIDE 0.9 % IV SOLN
250.0000 mL | INTRAVENOUS | Status: DC | PRN
Start: 2023-08-14 — End: 2023-08-14

## 2023-08-14 MED ORDER — ASPIRIN 81 MG PO TBEC: 81.0000 mg | DELAYED_RELEASE_TABLET | Freq: Every day | ORAL | 12 refills | Status: DC

## 2023-08-14 MED ORDER — ENOXAPARIN SODIUM 40 MG/0.4ML IJ SOSY: 40.0000 mg | PREFILLED_SYRINGE | INTRAMUSCULAR | Status: DC

## 2023-08-14 MED ORDER — LABETALOL HCL 5 MG/ML IV SOLN: 10.0000 mg | INTRAVENOUS | Status: DC | PRN

## 2023-08-14 MED ORDER — VERAPAMIL HCL 2.5 MG/ML IV SOLN
INTRAVENOUS | Status: DC | PRN
  Administered 2023-08-14: 10 mL via INTRA_ARTERIAL

## 2023-08-14 MED ORDER — FENTANYL CITRATE (PF) 100 MCG/2ML IJ SOLN
INTRAMUSCULAR | Status: DC | PRN
  Administered 2023-08-14: 25 ug via INTRAVENOUS

## 2023-08-14 MED ORDER — METOPROLOL SUCCINATE ER 25 MG PO TB24: 25.0000 mg | ORAL_TABLET | Freq: Every day | ORAL | 0 refills | Status: DC

## 2023-08-14 MED ORDER — SODIUM CHLORIDE 0.9 % IV SOLN: INTRAVENOUS | Status: DC

## 2023-08-14 MED ORDER — SODIUM CHLORIDE 0.9% FLUSH
3.0000 mL | Freq: Two times a day (BID) | INTRAVENOUS | Status: DC
  Administered 2023-08-14: 3 mL via INTRAVENOUS

## 2023-08-14 MED ORDER — MIDAZOLAM HCL 2 MG/2ML IJ SOLN
INTRAMUSCULAR | Status: AC
  Filled 2023-08-14: qty 2

## 2023-08-14 MED ORDER — SODIUM CHLORIDE 0.9% FLUSH: 3.0000 mL | INTRAVENOUS | Status: DC | PRN

## 2023-08-14 MED ORDER — HYDRALAZINE HCL 20 MG/ML IJ SOLN: 10.0000 mg | INTRAMUSCULAR | Status: DC | PRN

## 2023-08-14 MED ORDER — LIDOCAINE HCL (PF) 1 % IJ SOLN
INTRAMUSCULAR | Status: DC | PRN
  Administered 2023-08-14: 2 mL

## 2023-08-14 MED ORDER — IOHEXOL 350 MG/ML SOLN
INTRAVENOUS | Status: DC | PRN
  Administered 2023-08-14: 45 mL

## 2023-08-14 MED ORDER — MIDAZOLAM HCL 2 MG/2ML IJ SOLN
INTRAMUSCULAR | Status: DC | PRN
Start: 2023-08-14 — End: 2023-08-14
  Administered 2023-08-14: 1 mg via INTRAVENOUS

## 2023-08-14 MED ORDER — HEPARIN (PORCINE) IN NACL 1000-0.9 UT/500ML-% IV SOLN
INTRAVENOUS | Status: DC | PRN
  Administered 2023-08-14 (×2): 500 mL

## 2023-08-14 MED ORDER — LIDOCAINE HCL (PF) 1 % IJ SOLN
INTRAMUSCULAR | Status: AC
  Filled 2023-08-14: qty 30

## 2023-08-14 MED ORDER — HEPARIN SODIUM (PORCINE) 1000 UNIT/ML IJ SOLN
INTRAMUSCULAR | Status: DC | PRN
  Administered 2023-08-14: 5000 [IU] via INTRAVENOUS

## 2023-08-14 MED ORDER — VERAPAMIL HCL 2.5 MG/ML IV SOLN
INTRAVENOUS | Status: AC
  Filled 2023-08-14: qty 2

## 2023-08-14 SURGICAL SUPPLY — 6 items
CATH 5FR JL3.5 JR4 ANG PIG MP (CATHETERS) IMPLANT
DEVICE RAD COMP TR BAND LRG (VASCULAR PRODUCTS) IMPLANT
GLIDESHEATH SLEND SS 6F .021 (SHEATH) IMPLANT
GUIDEWIRE INQWIRE 1.5J.035X260 (WIRE) IMPLANT
PACK CARDIAC CATHETERIZATION (CUSTOM PROCEDURE TRAY) ×1 IMPLANT
SET ATX-X65L (MISCELLANEOUS) IMPLANT

## 2023-08-14 NOTE — Discharge Summary (Signed)
 Luis Hardin FMW:[id number] DOB: 01/09/1966 DOA: 08/12/2023  PCP: Katrinka Aquas, MD  Admit date: 08/12/2023  Discharge date: 08/14/2023  Admitted From: Home   disposition: Home   Recommendations for Outpatient Follow-up:   Follow up with cardiology per their recommendations  Home Health: N/A Equipment/Devices: N/A Consultations: Cardiology Discharge Condition: Stable CODE STATUS: Full Diet Recommendation: Heart Healthy   Diet Order             Diet Heart Room service appropriate? Yes; Fluid consistency: Thin  Diet effective now                    Chief Complaint  Patient presents with   Chest Pain     Brief history of present illness from the day of admission and additional interim summary    Luis Hardin is a 58 y.o. male with medical history significant of prostate cancer, hypertension, hyperlipidemia.  Patient comes from home with chest pain: Left-sided that goes down his left arm.  Denies radiation up into his neck.  Patient did have some nausea and diaphoresis.  Initially pain/pressure was 6 out of 10 but currently down to 1 out of 10.  Patient was exerting himself when pain began (having sexual intercourse with wife).  Patient states he exerts himself every other week but for the last several months has had no chest discomfort.  He did have a similar episode in April 2025 for which he was admitted to [facility] with a CT angio.  Patient does take sildenafil daily.                                                                     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  There is some consideration that this was a steal syndrome.   Viagra and lisinopril  were discontinued.  Patient was started on metoprolol .  Patient underwent cardiac catheterization on 714/25 and was not found to have any stenotic lesions concerning for ischemia.  Patient was started on Toprol -XL in hopes of decreasing myocardial contraction/rate to ease pressure on the anomalous artery.  Patient is discharged home to follow-up with cardiology per their recommendations.   Discharge diagnosis     Principal Problem:   NSTEMI (non-ST elevated myocardial infarction) [facility])    Discharge instructions    Discharge Instructions     AMB referral to Phase II Cardiac Rehabilitation   Complete by: As directed    Diagnosis: Type II MI   After initial evaluation and assessments completed: Virtual Based Care may be provided alone or in conjunction with Phase 2 Cardiac Rehab based on  patient barriers.: Yes   Intensive Cardiac Rehabilitation (ICR) [facility] location only OR Traditional Cardiac Rehabilitation (TCR) *If criteria for ICR are not met will enroll in TCR [facility] only): Yes   Discharge instructions   Complete by: As directed    Radial Site Care Refer to this sheet in the next few weeks. These instructions provide you with information on caring for yourself after your procedure. Your caregiver may also give you more specific instructions. Your treatment has been planned according to current medical practices, but problems sometimes occur. Call your caregiver if you have any problems or questions after your procedure. HOME CARE INSTRUCTIONS You may shower the day after the procedure. Remove the bandage (dressing) and gently wash the site with plain soap and water . Gently pat the site dry.  Do not apply powder or lotion to the site.  Do not submerge the affected site in water  for 3 to 5 days.  Inspect the site at least twice daily.  Do not flex or bend the affected arm for 24 hours.  No lifting over 5 pounds (2.3 kg) for 5 days after your procedure.  Do not drive  home if you are discharged the same day of the procedure. Have someone else drive you.  You may drive 24 hours after the procedure unless otherwise instructed by your caregiver.  What to expect: Any bruising will usually fade within 1 to 2 weeks.  Blood that collects in the tissue (hematoma) may be painful to the touch. It should usually decrease in size and tenderness within 1 to 2 weeks.  SEEK IMMEDIATE MEDICAL CARE IF: You have unusual pain at the radial site.  You have redness, warmth, swelling, or pain at the radial site.  You have drainage (other than a small amount of blood on the dressing).  You have chills.  You have a fever or persistent symptoms for more than 72 hours.  You have a fever and your symptoms suddenly get worse.  Your arm becomes pale, cool, tingly, or numb.  You have heavy bleeding from the site. Hold pressure on the site.   Discharge instructions   Complete by: As directed    It is recommended that you stop taking the Viagra for now.  Discuss with your cardiologist about when it will be safe for you to restart taking it.   Increase activity slowly   Complete by: As directed        Discharge Medications   Allergies as of 08/14/2023   No Known Allergies      Medication List     STOP taking these medications    lisinopril  20 MG tablet Commonly known as: ZESTRIL    sildenafil 100 MG tablet Commonly known as: VIAGRA       TAKE these medications    acetaminophen  500 MG tablet Commonly known as: TYLENOL  Take 1,000 mg by mouth every 6 (six) hours as needed for moderate pain.   aspirin  EC 81 MG tablet Take 1 tablet (81 mg total) by mouth daily. Swallow whole. Start taking on: August 15, 2023   atorvastatin  20 MG tablet Commonly known as: Lipitor Take 1 tablet (20 mg total) by mouth daily.   FISH OIL PO Take 1 capsule by mouth daily.   metoprolol  succinate 25 MG 24 hr tablet Commonly known as: Toprol  XL Take 1 tablet (25 mg total) by mouth  daily.   multivitamin tablet Take 1 tablet by mouth daily.   omeprazole 20 MG capsule Commonly known as: PRILOSEC  Take 20 mg by mouth daily.   triamcinolone cream 0.1 % Commonly known as: KENALOG Apply 1 Application topically 2 (two) times daily as needed (eczema).          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  No significant disease involving LCx or RCA. Normal left ventricular systolic function (LVEF 55-65%) and filling pressure (LVEDP 10 mmHg). Recommendations: Continue medical therapy, including aggressive beta-blockage. Consider functional study to assess for exercise-induced LAD-territory ischemia and/or cardiac surgery consultation. Luis Hanson, MD [facility]  ECHOCARDIOGRAM COMPLETE Result Date: 08/13/2023    ECHOCARDIOGRAM REPORT   Patient Name:   Luis Hardin Date of Exam: 08/13/2023 Medical Rec #:  [id number]        Height:       69.0 in Accession #:    [id number]       Weight:       206.1 lb Date of Birth:  [birth year]        BSA:          2.093 m Patient Age:    58 years         BP:           124/74 mmHg Patient Gender: M                HR:           69 bpm. Exam Location:  Inpatient Procedure: 2D Echo, Cardiac Doppler and Color Doppler (Both Spectral and Color            Flow Doppler were utilized during procedure). Indications:    Chest Pain  History:        Patient has prior history of Echocardiogram examinations, most                 recent 05/16/2023. Signs/Symptoms:Chest Pain; Risk                 Factors:Hypertension.  Sonographer:    Philomena Daring Referring Phys: JESSICA U VANN IMPRESSIONS  1. Left ventricular ejection fraction, by estimation, is 60 to 65%. The left ventricle has normal function. The left  ventricle has no regional wall motion abnormalities. There is mild left ventricular hypertrophy. Left ventricular diastolic parameters were normal.  2. Right ventricular systolic function is normal. The right ventricular size is normal. Tricuspid regurgitation signal is inadequate for assessing PA pressure.  3. The mitral valve is grossly normal. Trivial mitral valve regurgitation. No evidence of mitral stenosis.  4. The aortic valve is grossly normal. There is mild thickening of the aortic valve. Aortic valve regurgitation is trivial. No aortic stenosis is present.  5. The inferior vena cava is normal in size with greater than 50% respiratory variability, suggesting right atrial pressure of 3 mmHg. FINDINGS  Left Ventricle: Left ventricular ejection fraction, by estimation, is 60 to 65%. The left ventricle has normal function. The left ventricle has no regional wall motion abnormalities. The left ventricular internal cavity size was normal in size. There is  mild left ventricular hypertrophy. Left ventricular diastolic parameters were normal. Right Ventricle: The right ventricular size is normal. No increase in right ventricular wall thickness. Right ventricular systolic function is normal. Tricuspid regurgitation signal is inadequate for assessing PA pressure. Left Atrium: Left atrial size was  normal in size. Right Atrium: Right atrial size was normal in size. Pericardium: There is no evidence of pericardial effusion. Presence of epicardial fat layer. Mitral Valve: The mitral valve is grossly normal. Trivial mitral valve regurgitation. No evidence of mitral valve stenosis. Tricuspid Valve: The tricuspid valve is normal in structure. Tricuspid valve regurgitation is not demonstrated. No evidence of tricuspid stenosis. Aortic Valve: The aortic valve is grossly normal. There is mild thickening of the aortic valve. Aortic valve regurgitation is trivial. No aortic stenosis is present. Pulmonic Valve: The pulmonic valve  was normal in structure. Pulmonic valve regurgitation is trivial. No evidence of pulmonic stenosis. Aorta: The aortic root is normal in size and structure. Venous: The inferior vena cava is normal in size with greater than 50% respiratory variability, suggesting right atrial pressure of 3 mmHg. IAS/Shunts: No atrial level shunt detected by color flow Doppler.  LEFT VENTRICLE PLAX 2D LVIDd:         4.90 cm   Diastology LVIDs:         2.80 cm   LV e' medial:    10.60 cm/s LV PW:         1.00 cm   LV E/e' medial:  8.0 LV IVS:        1.00 cm   LV e' lateral:   12.50 cm/s LVOT diam:     2.30 cm   LV E/e' lateral: 6.8 LV SV:         80 LV SV Index:   38 LVOT Area:     4.15 cm  RIGHT VENTRICLE             IVC RV S prime:     13.60 cm/s  IVC diam: 1.20 cm TAPSE (M-mode): 2.4 cm LEFT ATRIUM             Index        RIGHT ATRIUM           Index LA diam:        3.70 cm 1.77 cm/m   RA Area:     18.20 cm LA Vol (A2C):   48.5 ml 23.18 ml/m  RA Volume:   49.80 ml  23.80 ml/m LA Vol (A4C):   52.6 ml 25.14 ml/m LA Biplane Vol: 50.4 ml 24.09 ml/m  AORTIC VALVE LVOT Vmax:   103.00 cm/s LVOT Vmean:  64.500 cm/s LVOT VTI:    0.192 m  AORTA Ao Root diam: 3.30 cm Ao Asc diam:  3.30 cm MITRAL VALVE MV Area (PHT): 3.65 cm    SHUNTS MV Decel Time: 208 msec    Systemic VTI:  0.19 m MV E velocity: 84.40 cm/s  Systemic Diam: 2.30 cm MV A velocity: 83.30 cm/s MV E/A ratio:  1.01 Luis Merck MD Electronically signed by Luis Merck MD Signature Date/Time: 08/13/2023/11:30:29 AM    Final    DG Chest 2 View Result Date: 08/12/2023 EXAM: 2 VIEW(S) XRAY OF THE CHEST 08/12/2023 07:16:00 AM COMPARISON: 05/15/2023 CLINICAL HISTORY: Left side chest pain that started an hour ago, went down his arm, with nausea and diaphoresis. Uncomfortable pressure 6/10 initially, 1/10 now. Patient stated that he has the same type of pain about a month ago. FINDINGS: LUNGS AND PLEURA: No focal pulmonary opacity. No pulmonary edema. No pleural effusion.  No pneumothorax. HEART AND MEDIASTINUM: No acute abnormality of the cardiac and mediastinal silhouettes. BONES AND SOFT TISSUES: No acute osseous abnormality. IMPRESSION: 1. No acute process. Electronically signed by: Luis Necessary MD  08/12/2023 07:24 AM EDT RP Workstation: [id number]    Micro Results    No results found for this or any previous visit (from the past 240 hours).  Today   Subjective    Luis Hardin feels much improved since admission.  Feels ready to go home.  Denies chest pain, shortness of breath or abdominal pain.  Feels they can take care of themselves with the resources they have at home.  Objective   Blood pressure 135/82, pulse 66, temperature 97.7 F (36.5 C), temperature source Oral, resp. rate 18, height 5' 9 (1.753 m), weight 93.4 kg, SpO2 100%.   Intake/Output Summary (Last 24 hours) at 08/14/2023 1522 Last data filed at 08/14/2023 0400 Gross per 24 hour  Intake 696.75 ml  Output --  Net 696.75 ml    Exam General: Patient appears well and in good spirits sitting up in bed in no acute distress.  Eyes: sclera anicteric, conjuctiva mild injection bilaterally CVS: S1-S2, regular  Respiratory:  decreased air entry bilaterally secondary to decreased inspiratory effort, rales at bases  GI: NABS, soft, NT  LE: No edema.  Neuro: A/O x 3, Moving all extremities equally with normal strength, CN 3-12 intact, grossly nonfocal.  Psych: patient is logical and coherent, judgement and insight appear normal, mood and affect appropriate to situation.    Data Review   CBC w Diff:  Lab Results  Component Value Date   WBC 7.1 08/14/2023   HGB 13.4 08/14/2023   HCT 38.3 (L) 08/14/2023   PLT 264 08/14/2023   LYMPHOPCT 28 09/07/2009   MONOPCT 9 09/07/2009   EOSPCT 2 09/07/2009   BASOPCT 0 09/07/2009    CMP:  Lab Results  Component Value Date   NA 137 08/14/2023   K 4.6 08/14/2023   CL 106 08/14/2023   CO2 23 08/14/2023   BUN 10 08/14/2023    CREATININE 1.00 08/14/2023   PROT 6.6 05/15/2023   ALBUMIN 4.0 05/15/2023   BILITOT 0.2 05/15/2023   ALKPHOS 64 05/15/2023   AST 17 05/15/2023   ALT 19 05/15/2023  .   Total Time in preparing paper work, data evaluation and todays exam - 35 minutes  Reginal Wojcicki Tublu Danese Dorsainvil M.D on 08/14/2023 at 3:22 PM  [facility]

## 2023-08-14 NOTE — Progress Notes (Signed)
 PHARMACY - ANTICOAGULATION CONSULT NOTE  Pharmacy Consult for IV heparin  Indication: chest pain/ACS  No Known Allergies  Patient Measurements: Height: 5' 9 (175.3 cm) Weight: 93.4 kg (205 lb 14.4 oz) IBW/kg (Calculated) : 70.7 HEPARIN  DW (KG): 91.1  Vital Signs: Temp: 97.9 F (36.6 C) (07/14 0425) Temp Source: Oral (07/14 0425) BP: 129/90 (07/14 0425) Pulse Rate: 74 (07/14 0425)  Labs: Recent Labs    08/12/23 0637 08/12/23 0818 08/12/23 1044 08/12/23 1227 08/13/23 0416 08/13/23 1054 08/14/23 0405  HGB 14.3  --   --   --  13.5  --  13.4  HCT 40.0  --   --   --  39.1  --  38.3*  PLT 293  --   --   --  289  --  264  HEPARINUNFRC  --   --   --   --  0.37 0.34 0.50  CREATININE 0.85  --   --   --  0.95  --  1.00  TROPONINIHS 43* 146* 160* 121*  --   --   --     Estimated Creatinine Clearance: 90.9 mL/min (by C-G formula based on SCr of 1 mg/dL).   Medical History: Past Medical History:  Diagnosis Date   Cancer (HCC) 2023   prostate cancer   GERD (gastroesophageal reflux disease)    H/O hiatal hernia    Hyperlipidemia    Hypertension    Inguinal hernia    RIGHT   Tendonitis    KNEES    Medications:  Scheduled:   aspirin  EC  81 mg Oral Daily   atorvastatin   20 mg Oral Daily   metoprolol  tartrate  12.5 mg Oral BID   pantoprazole   40 mg Oral Daily   Infusions:   sodium chloride  1 mL/kg/hr (08/14/23 0534)   heparin  1,300 Units/hr (08/14/23 0400)    Assessment: Pt is a 58 yo male transferred from [facility] on IV heparin  for NSTEMI. Pt is therapeutic with anti-Xa 0.5 on heparin  1300 units/hr. PLT WNL 264. Hgb WNL 13.4. Patient for LHC today. Will f/u after cath for continuation.   Goal of Therapy:  Heparin  level 0.3-0.7 units/ml Monitor platelets by anticoagulation protocol: Yes   Plan:  Continue heparin  1300 units/hr Monitor daily heparin  levels Continue to monitor H&H and platelets  Anagha Loseke A. Lyle, PharmD, BCPS, FNKF Clinical Pharmacist [facility] Please utilize Amion for appropriate phone number to reach the unit pharmacist [facility])   08/14/2023 7:14 AM

## 2023-08-14 NOTE — Interval H&P Note (Signed)
 History and Physical Interval Note:  08/14/2023 1:27 PM  Luis Hardin  has presented today for surgery, with the diagnosis of NSTEMI.  The various methods of treatment have been discussed with the patient and family. After consideration of risks, benefits and other options for treatment, the patient has consented to  Procedure(s): LEFT HEART CATH AND CORONARY ANGIOGRAPHY (N/A) as a surgical intervention.  The patient's history has been reviewed, patient examined, no change in status, stable for surgery.  I have reviewed the patient's chart and labs.  Questions were answered to the patient's satisfaction.    Cath Lab Visit (complete for each Cath Lab visit)  Clinical Evaluation Leading to the Procedure:   ACS: Yes.    Non-ACS:  N/A  [name]

## 2023-08-14 NOTE — H&P (View-Only) (Signed)
  Progress Note  Patient Name: Luis Hardin Date of Encounter: 08/14/2023 [facility] Cardiologist: Vishnu P Mallipeddi, MD   Interval Summary    No chest pain this morning. Wife at the bedside.   Vital Signs Vitals:   08/13/23 2225 08/14/23 0425 08/14/23 0435 08/14/23 0759  BP: 106/67 (!) 129/90  124/81  Pulse: 64 74  70  Resp: 14 16  20   Temp: 97.9 F (36.6 C) 97.9 F (36.6 C)  97.7 F (36.5 C)  TempSrc: Oral Oral  Oral  SpO2: 97% 100%  100%  Weight:   93.4 kg   Height:        Intake/Output Summary (Last 24 hours) at 08/14/2023 0843 Last data filed at 08/14/2023 0400 Gross per 24 hour  Intake 696.75 ml  Output 100 ml  Net 596.75 ml      08/14/2023    4:35 AM 08/13/2023    2:51 AM 08/12/2023    6:29 AM  Last 3 Weights  Weight (lbs) 205 lb 14.4 oz 206 lb 1.6 oz 214 lb 8.1 oz  Weight (kg) 93.396 kg 93.486 kg 97.3 kg      Telemetry/ECG   Sinus Rhythm - Personally Reviewed  Physical Exam  GEN: No acute distress.   Neck: No JVD Cardiac: RRR, no murmurs, rubs, or gallops.  Respiratory: Clear to auscultation bilaterally. GI: Soft, nontender, non-distended  MS: No edema  Assessment & Plan   58 y.o. male with a history of anomalous coronary artery, hypertension, pulmonary nodules, prior prostate cancer admitted for recurrence of chest pain recent CCTA with nonobstructive CAD.   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 He is known to have his LAD originating from the RCA cusp with separate ostia. LAD courses through the aorta and pulmonary artery.  -- Recently restarted daily Viagra -- hsTn peaked at 160 -- no chest pain today, planned for cardiac cath today -- continue ASA, metoprolol  12.5mg  BID   Dyslipidemia  -- LDL 40, HDL 35 -- Continue lipitor   Hypertension -- controlled -- continue metoprolol     Patent Foramen Ovale --  Noted on CCTA   Per primary  Pulmonary nodules H/o of prostate cancer  For questions or updates, please contact [facility] Please consult [email or web address] for contact info under       Signed, Manuelita Rummer, NP

## 2023-08-14 NOTE — Progress Notes (Signed)
  Progress Note  Patient Name: Luis Hardin Date of Encounter: 08/14/2023 [facility] Cardiologist: Vishnu P Mallipeddi, MD   Interval Summary    No chest pain this morning. Wife at the bedside.   Vital Signs Vitals:   08/13/23 2225 08/14/23 0425 08/14/23 0435 08/14/23 0759  BP: 106/67 (!) 129/90  124/81  Pulse: 64 74  70  Resp: 14 16  20   Temp: 97.9 F (36.6 C) 97.9 F (36.6 C)  97.7 F (36.5 C)  TempSrc: Oral Oral  Oral  SpO2: 97% 100%  100%  Weight:   93.4 kg   Height:        Intake/Output Summary (Last 24 hours) at 08/14/2023 0843 Last data filed at 08/14/2023 0400 Gross per 24 hour  Intake 696.75 ml  Output 100 ml  Net 596.75 ml      08/14/2023    4:35 AM 08/13/2023    2:51 AM 08/12/2023    6:29 AM  Last 3 Weights  Weight (lbs) 205 lb 14.4 oz 206 lb 1.6 oz 214 lb 8.1 oz  Weight (kg) 93.396 kg 93.486 kg 97.3 kg      Telemetry/ECG   Sinus Rhythm - Personally Reviewed  Physical Exam  GEN: No acute distress.   Neck: No JVD Cardiac: RRR, no murmurs, rubs, or gallops.  Respiratory: Clear to auscultation bilaterally. GI: Soft, nontender, non-distended  MS: No edema  Assessment & Plan   58 y.o. male with a history of anomalous coronary artery, hypertension, pulmonary nodules, prior prostate cancer admitted for recurrence of chest pain recent CCTA with nonobstructive CAD.   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 He is known to have his LAD originating from the RCA cusp with separate ostia. LAD courses through the aorta and pulmonary artery.  -- Recently restarted daily Viagra -- hsTn peaked at 160 -- no chest pain today, planned for cardiac cath today -- continue ASA, metoprolol  12.5mg  BID   Dyslipidemia  -- LDL 40, HDL 35 -- Continue lipitor   Hypertension -- controlled -- continue metoprolol     Patent Foramen Ovale --  Noted on CCTA   Per primary  Pulmonary nodules H/o of prostate cancer  For questions or updates, please contact [facility] Please consult [email or web address] for contact info under       Signed, Manuelita Rummer, NP

## 2023-08-14 NOTE — Progress Notes (Signed)
 Luis Hardin to be D/C'd Home per MD order.  Discussed with the patient and all questions fully answered.  VSS, Skin clean, dry and intact without evidence of skin break down, no evidence of skin tears noted. IV catheter discontinued intact. Site without signs and symptoms of complications. Dressing and pressure applied.  An After Visit Summary was printed and given to the patient. Patient received prescription.  D/c education completed with patient/family including follow up instructions, medication list, d/c activities limitations if indicated, with other d/c instructions as indicated by MD - patient able to verbalize understanding, all questions fully answered.   Patient instructed to return to ED, call 911, or call MD for any changes in condition.   Patient escorted via WC, and D/C home via private auto.  [name] Tobie Hellen 08/14/2023 6:13 PM

## 2023-08-15 ENCOUNTER — Encounter (HOSPITAL_COMMUNITY): Payer: Self-pay | Admitting: Internal Medicine

## 2023-08-15 LAB — LIPOPROTEIN A (LPA): Lipoprotein (a): 19.2 nmol/L (ref <75.0)

## 2023-08-18 NOTE — Progress Notes (Deleted)
 Cardiology Office Note:    Date:  08/18/2023   ID:  Luis Hardin, DOB Oct 16, 1965, MRN [id number]  PCP:  Katrinka Aquas, MD  Cardiologist:  Diannah SHAUNNA Maywood, MD { Click to update primary MD,subspecialty MD or APP then REFRESH:1}    Referring MD: Katrinka Aquas, MD   Chief Complaint: hospital follow-up of chest pain  History of Present Illness:    Luis Hardin is a 58 y.o. 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   Patient was admitted in 05/2023 for chest pain. He described multiple episodes of chest pain with radiation down left arm during sexual intercourse. Echo showed LVEF of 60-65% with normal wall motion and grade 1 diastolic dysfunction, normal RV function, and no significant valvular disease. 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   He was seen by Dr. Lavona for follow-up in 07/2023 at which time he was doing well with no recurrent chest pain.   However, he was recently readmitted from 08/12/2023 to 08/14/2023 for recurrent chest pain. High-sensitivity troponin peaked at 160. Echo showed LVEF of 60-65% with mild LVH but normal diastolic parameters, normal RV function, and no significant valvular disease. There was concern that his symptoms was coming from the anomalous LAD. LHC showed minimal disease in the proximal LAD and confirmed anomalous LAD arising form right coronary cusp (known to have interatrial course by coronary CTA). Continue medical therapy was recommended with consideration of outpatient functional study to assess for exercise induced LAD territory ischemia and/ or CT surgery.   Patient presents today for  follow-up. ***  Mild Non-Obstructive CAD Anomalous LAD Arising from Right Coronary Cusp Patient has had 2 admissions over the last 3 months for chest pain with exertion (usually during sexual intercourse). 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 LHC on 08/14/2023 during recent admission showed minimal disease in the proximal LAD and confirmed anomalous LAD arising form right coronary cusp (known to have interatrial course by coronary CTA). Continue medical therapy was recommended with consideration of outpatient functional study to assess for exercise induced LAD territory ischemia and/ or outpatient CT surgery evaluation.  - No chest pain. *** - Continue Toprol -XL 25mg  daily.  - Continue aspirin  and statin. - Exercise Myoview ***  Hypertension BP well controlled. *** - Continue Toprol -XL 25mg  daily.   Hyperlipidemia Lipid panel on 08/13/2023: Total Cholesterol 91, Triglycerides 81, HDL 35, LDL 40. LDL goal <70 given CAD. - Continue Lipitor 20mg  daily.   EKGs/Labs/Other Studies Reviewed:    The following studies were reviewed:  Coronary CTA 05/18/2023: Impressions: 1. Coronary calcium  score of 7.23. This was 42nd percentile for age and sex matched control. 2. Total plaque volume 12 mm3 which is 12 th percentile for age- and sex-matched controls (calcified plaque 1 mm3; non-calcified plaque 11 mm3). TPV is mild. 3. 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 Right dominance. 4. CAD-RADS 1 Minimal calcified plaque (0-24%) mid LAD. 5. 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 Findings will  be reported separately. 6. Patent foramen ovale noted.  FFR Summary: CT FFR analysis showed no significant stenosis.  _______________  Echocardiogram 08/13/2023: Impressions: 1. Left  ventricular ejection fraction, by estimation, is 60 to 65%. The  left ventricle has normal function. The left ventricle has no regional  wall motion abnormalities. There is mild left ventricular hypertrophy.  Left ventricular diastolic parameters  were normal.   2. Right ventricular systolic function is normal. The right ventricular  size is normal. Tricuspid regurgitation signal is inadequate for assessing  PA pressure.   3. The mitral valve is grossly normal. Trivial mitral valve  regurgitation. No evidence of mitral stenosis.   4. The aortic valve is grossly normal. There is mild thickening of the  aortic valve. Aortic valve regurgitation is trivial. No aortic stenosis is  present.   5. The inferior vena cava is normal in size with greater than 50%  respiratory variability, suggesting right atrial pressure of 3 mmHg.  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  No significant disease involving LCx or RCA. Normal left ventricular systolic function (LVEF 55-65%) and filling pressure (LVEDP 10 mmHg).   Recommendations: Continue medical therapy, including aggressive beta-blockage. Consider functional study to assess for exercise-induced LAD-territory ischemia and/or cardiac surgery consultation.  Diagnostic Dominance: Right     EKG:  EKG not ordered today.  Recent Labs: 05/15/2023: ALT 19 08/14/2023: BUN 10; Creatinine, Ser 1.00; Hemoglobin 13.4; Platelets 264; Potassium 4.6; Sodium 137  Recent Lipid Panel    Component Value Date/Time   CHOL 91 08/13/2023 0416   TRIG 81 08/13/2023 0416   HDL 35 (L) 08/13/2023 0416   CHOLHDL 2.6 08/13/2023 0416   VLDL 16 08/13/2023 0416   LDLCALC 40 08/13/2023 0416    Physical Exam:    Vital Signs: There were no vitals taken for this visit.    Wt Readings from Last 3 Encounters:   08/14/23 205 lb 14.4 oz (93.4 kg)  07/06/23 214 lb 6.4 oz (97.3 kg)  05/18/23 207 lb (93.9 kg)     General: 58 y.o. male in no acute distress. HEENT: Normocephalic and atraumatic. Sclera clear.  Neck: Supple. No carotid bruits. No JVD. Heart: *** RRR. Distinct S1 and S2. No murmurs, gallops, or rubs.  Lungs: No increased work of breathing. Clear to ausculation bilaterally. No wheezes, rhonchi, or rales.  Abdomen: Soft, non-distended, and non-tender to palpation.  Extremities: No lower extremity edema.  Radial and distal pedal pulses 2+ and equal bilaterally. Skin: Warm and dry. Neuro: No focal deficits. Psych: Normal affect. Responds appropriately.   Assessment:    No diagnosis found.  Plan:     Disposition: Follow up in ***   Signed, Wilhelmena Zea E Randie Bloodgood, PA-C  08/18/2023 8:16 AM    [facility]

## 2023-08-20 ENCOUNTER — Emergency Department (HOSPITAL_COMMUNITY)
Admission: EM | Admit: 2023-08-20 | Discharge: 2023-08-20 | Disposition: A | Discharge status: Home / Self Care | Attending: Emergency Medicine | Admitting: Emergency Medicine

## 2023-08-20 ENCOUNTER — Encounter (HOSPITAL_COMMUNITY): Payer: Self-pay

## 2023-08-20 ENCOUNTER — Emergency Department (HOSPITAL_COMMUNITY)

## 2023-08-20 ENCOUNTER — Other Ambulatory Visit: Payer: Self-pay

## 2023-08-20 DIAGNOSIS — R079 Chest pain, unspecified: Secondary | ICD-10-CM

## 2023-08-20 DIAGNOSIS — Z7982 Long term (current) use of aspirin: Secondary | ICD-10-CM | POA: Diagnosis not present

## 2023-08-20 DIAGNOSIS — R0789 Other chest pain: Secondary | ICD-10-CM | POA: Diagnosis not present

## 2023-08-20 LAB — CBC WITH DIFFERENTIAL/PLATELET
Abs Immature Granulocytes: 0.03 K/uL (ref 0.00–0.07)
Basophils Absolute: 0.1 K/uL (ref 0.0–0.1)
Basophils Relative: 1 %
Eosinophils Absolute: 0.3 K/uL (ref 0.0–0.5)
Eosinophils Relative: 3 %
HCT: 41.5 % (ref 39.0–52.0)
Hemoglobin: 14.9 g/dL (ref 13.0–17.0)
Immature Granulocytes: 0 %
Lymphocytes Relative: 20 %
Lymphs Abs: 1.6 K/uL (ref 0.7–4.0)
MCH: 31.8 pg (ref 26.0–34.0)
MCHC: 35.9 g/dL (ref 30.0–36.0)
MCV: 88.7 fL (ref 80.0–100.0)
Monocytes Absolute: 0.6 K/uL (ref 0.1–1.0)
Monocytes Relative: 7 %
Neutro Abs: 5.6 K/uL (ref 1.7–7.7)
Neutrophils Relative %: 69 %
Platelets: 257 K/uL (ref 150–400)
RBC: 4.68 MIL/uL (ref 4.22–5.81)
RDW: 12.7 % (ref 11.5–15.5)
WBC: 8.2 K/uL (ref 4.0–10.5)
nRBC: 0 % (ref 0.0–0.2)

## 2023-08-20 LAB — BASIC METABOLIC PANEL WITH GFR
Anion gap: 10 (ref 5–15)
BUN: 10 mg/dL (ref 6–20)
CO2: 25 mmol/L (ref 22–32)
Calcium: 9.4 mg/dL (ref 8.9–10.3)
Chloride: 102 mmol/L (ref 98–111)
Creatinine, Ser: 1.03 mg/dL (ref 0.61–1.24)
GFR, Estimated: >60 mL/min (ref >60)
Glucose, Bld: 114 mg/dL — ABNORMAL HIGH (ref 70–99)
Potassium: 4.3 mmol/L (ref 3.5–5.1)
Sodium: 137 mmol/L (ref 135–145)

## 2023-08-20 LAB — MAGNESIUM: Magnesium: 2.1 mg/dL (ref 1.7–2.4)

## 2023-08-20 LAB — TROPONIN I (HIGH SENSITIVITY)
Troponin I (High Sensitivity): 7 ng/L (ref <18)
Troponin I (High Sensitivity): 8 ng/L (ref <18)

## 2023-08-20 MED ORDER — ASPIRIN 81 MG PO CHEW: 324.0000 mg | CHEWABLE_TABLET | Freq: Once | ORAL | Status: DC

## 2023-08-20 MED ORDER — CARVEDILOL 6.25 MG PO TABS: 6.2500 mg | ORAL_TABLET | Freq: Two times a day (BID) | ORAL | 0 refills | Status: DC

## 2023-08-20 MED ORDER — ASPIRIN 81 MG PO CHEW
243.0000 mg | CHEWABLE_TABLET | Freq: Once | ORAL | Status: AC
  Administered 2023-08-20: 243 mg via ORAL
  Filled 2023-08-20: qty 3

## 2023-08-20 NOTE — ED Provider Notes (Signed)
 [place] EMERGENCY DEPARTMENT AT [facility] Provider Note   CSN: [id number] Arrival date & time: 08/20/23  9373     Patient presents with: Chest Pain   Luis Hardin is a 58 y.o. male.    Chest Pain  This patient is a 58 year old male recently diagnosed with an anomalous LAD artery, ischemia thought to be related to compression of his artery externally, stopped on lisinopril  and Cialis and started on metoprolol .  He presents today with recurrent chest discomfort similar to what he had before, this is intense, severe at onset but now has eased off and there is just a very subtle left-sided chest discomfort remaining.  He did feel little bit sweaty when it occurred, states he stopped smoking about 30 years ago    Prior to Admission medications   Medication Sig Start Date End Date Taking? Authorizing Provider  carvedilol  (COREG ) 6.25 MG tablet Take 1 tablet (6.25 mg total) by mouth 2 (two) times daily with a meal. 08/20/23 09/19/23 Yes Cleotilde Rogue, MD  acetaminophen  (TYLENOL ) 500 MG tablet Take 1,000 mg by mouth every 6 (six) hours as needed for moderate pain.    [provider]  aspirin  EC 81 MG tablet Take 1 tablet (81 mg total) by mouth daily. Swallow whole. 08/15/23   Chatterjee, Srobona Tublu, MD  atorvastatin  (LIPITOR) 20 MG tablet Take 1 tablet (20 mg total) by mouth daily. 05/18/23   Gonfa, Taye T, MD  Multiple Vitamin (MULTIVITAMIN) tablet Take 1 tablet by mouth daily.    [provider]  Omega-3 Fatty Acids (FISH OIL PO) Take 1 capsule by mouth daily.    [provider]  omeprazole (PRILOSEC) 20 MG capsule Take 20 mg by mouth daily.    [provider]  triamcinolone cream (KENALOG) 0.1 % Apply 1 Application topically 2 (two) times daily as needed (eczema). 02/28/22   [provider]    Allergies: Patient has no known allergies.    Review of Systems  Cardiovascular:  Positive for chest pain.  All other systems  reviewed and are negative.   Updated Vital Signs BP (!) 153/97 (BP Location: Left Arm)   Pulse 83   Temp 98.1 F (36.7 C) (Oral)   Resp 20   Ht 1.753 m (5' 9)   Wt 95.3 kg   SpO2 99%   BMI 31.01 kg/m   Physical Exam Vitals and nursing note reviewed.  Constitutional:      General: He is not in acute distress.    Appearance: He is well-developed.  HENT:     Head: Normocephalic and atraumatic.     Mouth/Throat:     Pharynx: No oropharyngeal exudate.  Eyes:     General: No scleral icterus.       Right eye: No discharge.        Left eye: No discharge.     Conjunctiva/sclera: Conjunctivae normal.     Pupils: Pupils are equal, round, and reactive to light.  Neck:     Thyroid: No thyromegaly.     Vascular: No JVD.  Cardiovascular:     Rate and Rhythm: Normal rate and regular rhythm.     Heart sounds: Normal heart sounds. No murmur heard.    No friction rub. No gallop.  Pulmonary:     Effort: Pulmonary effort is normal. No respiratory distress.     Breath sounds: Normal breath sounds. No wheezing or rales.  Abdominal:     General: Bowel sounds are normal. There  is no distension.     Palpations: Abdomen is soft. There is no mass.     Tenderness: There is no abdominal tenderness.  Musculoskeletal:        General: No tenderness. Normal range of motion.     Cervical back: Normal range of motion and neck supple.     Right lower leg: No tenderness. No edema.     Left lower leg: No tenderness. No edema.  Lymphadenopathy:     Cervical: No cervical adenopathy.  Skin:    General: Skin is warm and dry.     Findings: No erythema or rash.  Neurological:     Mental Status: He is alert.     Coordination: Coordination normal.  Psychiatric:        Behavior: Behavior normal.     (all labs ordered are listed, but only abnormal results are displayed) Labs Reviewed  BASIC METABOLIC PANEL WITH GFR - Abnormal; Notable for the following components:      Result Value   Glucose, Bld  114 (*)    All other components within normal limits  MAGNESIUM   CBC WITH DIFFERENTIAL/PLATELET  TROPONIN I (HIGH SENSITIVITY)  TROPONIN I (HIGH SENSITIVITY)    EKG: EKG Interpretation Date/Time:  Sunday August 20 2023 06:55:15 EDT Ventricular Rate:  81 PR Interval:  178 QRS Duration:  101 QT Interval:  373 QTC Calculation: 433 R Axis:   20  Text Interpretation: Sinus rhythm Low voltage, precordial leads Anteroseptal infarct, old since last tracing no significant change Confirmed by Cleotilde Rogue ([id number]) on 08/20/2023 8:13:31 AM  Radiology: DG Chest Portable 1 View Result Date: 08/20/2023 CLINICAL DATA:  Left-sided chest pain with radiation to left arm that began this morning. Diaphoresis and shortness of breath. EXAM: PORTABLE CHEST 1 VIEW COMPARISON:  08/12/2023 FINDINGS: The heart size and mediastinal contours are within normal limits. Both lungs are clear. The visualized skeletal structures are unremarkable. IMPRESSION: No active disease. Electronically Signed   By: Waddell Calk M.D.   On: 08/20/2023 07:10     Procedures   Medications Ordered in the ED  aspirin  chewable tablet 243 mg (243 mg Oral Given 08/20/23 0652)    Clinical Course as of 08/20/23 0936  Sun Aug 20, 2023  0818 Patient is currently chest pain-free, awaiting second troponin before we talk to cardiology  [BM]    Clinical Course User Index [BM] Cleotilde Rogue, MD                                 Medical Decision Making Risk Prescription drug management.    This patient presents to the ED for concern of chest discomfort, this involves an extensive number of treatment options, and is a complaint that carries with it a high risk of complications and morbidity.  The differential diagnosis includes recurrent ischemia, external compression of artery seems most likely, could be related to gastrointestinal causes but seems less likely given his recent history   Co morbidities / Chronic conditions that  complicate the patient evaluation  Known coronary anomaly   Additional history obtained:  Additional history obtained from EMR External records from outside source obtained and reviewed including heart catheterization and echocardiogram from last week   Lab Tests:  I Ordered, and personally interpreted labs.  The pertinent results include: Troponin measured twice with no delta rise   Imaging Studies ordered:  I ordered imaging studies including chest x-ray I independently visualized  and interpreted imaging which showed no acute findings I agree with the radiologist interpretation   Cardiac Monitoring: / EKG:  The patient was maintained on a cardiac monitor.  I personally viewed and interpreted the cardiac monitored which showed an underlying rhythm of: The patient remained in sinus rhythm throughout Reevaluated several times, EKG shows no ischemia   Problem List / ED Course / Critical interventions / Medication management  I discussed the patient's care with the cardiologist on-call, they recommended that the patient be switched over to carvedilol  but if he continues to have chest pain and needs to be reseen in the emergency department he will likely need transfer to [facility] and evaluation by CT surgery to consider bypass of this blood vessel.  Although there is no obstructive coronary disease the anatomy of in the course of this blood vessel weds itself to the possible need for surgery.  That being said they recommend the patient be discharged on carvedilol  twice daily to return if symptoms worsen I ordered medication including carvedilol  twice daily Reevaluation of the patient after these medicines showed that the patient the patient received no meds in the ED and did very well without any symptoms at all I have reviewed the patients home medicines and have made adjustments as needed   Consultations Obtained:  I requested consultation with the cardiologist,  and  discussed lab and imaging findings as well as pertinent plan - they recommend: As above   Social Determinants of Health:  Prior NSTEMI from anomalous LAD   Test / Admission - Considered:  Considered admission but cardiology recommends discharge with new medications Patient is symptom-free and comfortable with plan      Final diagnoses:  Chest pain at rest    ED Discharge Orders          Ordered    carvedilol  (COREG ) 6.25 MG tablet  2 times daily with meals        08/20/23 0934               Cleotilde Rogue, MD 08/20/23 [phone]

## 2023-08-20 NOTE — Discharge Instructions (Addendum)
 I have spoken with the cardiologist on-call and we have reviewed your case.  At this time they want you to be changed to a medication called carvedilol .  You can stop taking the metoprolol  immediately and switch over to this medication.  It is taken twice a day.  Please keep your appointment in 9 days with the cardiologist, they will likely refer you to the cardiothoracic surgeons as they may need to look at doing a bypass on this artery.  In the meantime if you do develop severe or worsening symptoms return to the hospital immediately

## 2023-08-20 NOTE — ED Provider Triage Note (Signed)
 Emergency Medicine Provider Triage Evaluation Note  Luis Hardin , a 58 y.o. male  was evaluated in triage.  Pt complains of chest pain.  Review of Systems  Positive: Chest pain, feeling hot Negative: Nausea, diaphoresis, shortness of breath  Physical Exam  Ht 5' 9 (1.753 m)   Wt 95.3 kg   BMI 31.01 kg/m  Gen:   Awake, no distress   Resp:  Normal effort  MSK:   Moves extremities without difficulty   Medical Decision Making  Medically screening exam initiated at 6:41 AM.  Appropriate orders placed.  [name] was informed that the remainder of the evaluation will be completed by another provider, this initial triage assessment does not replace that evaluation, and the importance of remaining in the ED until their evaluation is complete.  Patient presents for chest pain.  He had a similar episode a week ago.  His troponins elevated in the range of 150-200 at the time.  He was admitted for NSTEMI and underwent a left heart cath 6 days ago.  Results showed no areas of significant stenoses.  He was found to have an anomalous artery and there was consideration of a steal syndrome.  Recommendations were for aggressive beta-blockade.  Last night at 2 AM, he woke up with some left-sided chest pain with radiation of the left shoulder.  He had recurrence of this at 5 AM.  Episode lasted for 5 to 10 minutes.  He is currently pain-free but endorses some ongoing mild pressure in the left side of his chest.   [name], MD 08/20/23 [phone]

## 2023-08-20 NOTE — ED Triage Notes (Signed)
 Patient from home for L side chest pain with radiation to L arm that started this morning. Recent admission to [facility] for heart attack. Reports some diaphoresis and SOB. Denies N/V. Upon arrival to ER, patient is alert and oriented, ambu

## 2023-08-23 ENCOUNTER — Telehealth: Payer: Self-pay | Admitting: Student

## 2023-08-23 NOTE — Telephone Encounter (Signed)
 Received an FMLA form from [employer] (Insurance account manager) for daughter, Harlene Nose.  Patient is coming to see Callie Goodrich on 08/29/2023 and will sign release of information then.  I spoke with Harlene who understood that we do not accept debit or credit cards for the $29 form fee.

## 2023-08-27 NOTE — Progress Notes (Signed)
 Cardiology Progress Note    08/27/2023 Hospital Day: 5  Luis Hardin is a 58 y.o.male who was admitted on 08/23/2023 with anomalous left coronary artery off the right coronary cusp with an interarterial course now pending surgical repair on Monday 7/28.   Subjective:   No acute events overnight. Ambulating multiple laps in hallways. Denies CP or SOB  Objective:   Vital signs in last 24 hours: Temp:  [36.4 C (97.6 F)-36.6 C (97.9 F)] 36.6 C (97.8 F) Heart Rate:  [60-70] 65 Resp:  [16-18] 18 BP: (103-122)/(61-73) 122/73 SpO2: 97 % Last BM Date: 08/27/23  Weights: Last weight: 94.7 kg (208 lb 11.2 oz)    First weight: 94.4 kg (208 lb 1.6 oz) BMI: Body mass index is 30.82 kg/m.  24-Hour Intake/Output: I/O last 2 completed shifts: In: 1557 [P.O.:1557] Out: 0   Telemetry: SR 60s-70's    VAD:  No  Physical Exam:  General: alert, cooperative, and in NAD Respiratory: regular rate, symmetric, unlabored, clear to auscultation bilaterally, and no accessory muscle use Cardiac: regular rate, regular rhythm, S1, S2 present, no murmur, no rub, no gallop, and JVD non-elevated Abdomen: normal bowel sounds, soft, nontender, nondistended, and bowel sounds present in all 4 quadrants Extremities: extremities warm and well perfused, no clubbing or cyanosis, no edema, and distal pulses intact Lines: PIV  Labs:  BMP: Recent Labs  Lab 08/27/23 0516  NA 139  K 4.1  CL 103  CO2 25  BUN 9  CREATININE 1.2  GLUCOSE 127  CALCIUM  9.3  MG 2.0   CBC: Recent Labs  Lab 08/27/23 0516  WBC 9.6  HGB 13.7  HCT 38.9*  PLT 253   INR: Recent Labs  Lab 08/23/23 1927  INR 1.0    FK: No results for input(s): FK506 in the last 168 hours. CYA: No results for input(s): CYA in the last 73719 hours. LDH: No results for input(s): LDH in the last 168 hours.       Scheduled Medications: .  acetaminophen , 650 mg, Oral, Q4H PRN .  atorvastatin , 20 mg, Oral, Daily .  lidocaine ,  0.5 mL, Subcutaneous, As Directed .  metoprolol  SUCCinate, 50 mg, Oral, Q12H SCH .  nitroGLYcerin , 0.4 mg, Sublingual, As Directed .  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 Chest pain is described as left sided and substernal with radiation down the left arm, it is intermittent and occurs both with and without exertion. The character of pain is concerning for cardiac etiology, and given known anomalous lesion, intermittent coronary ischemia is highest on the differential. It is curious that he has only now developed symptoms given age (usually patients with ischemia from anomalous coronary artery present earlier). He had recent coronary angiogram this month at [facility] without significant coronary artery disease. Symptomatic anomalous left coronary artery off the right coronary cusp with interarterial course carries class I recommendation for surgery.  - CTS following, appreciate assistance. Plan for OR Wednesday  - Cont increase home Toprol  XL  - Hold ASA   # Dyslipidemia  LDL 40, HDL 35 - Continue lipitor  Comorbid Conditions:        Code Status:  Full Code VTE Prophylaxis:  VTE Prophylaxis (Last Assessment on 7/24 at  1:05 PM)  + Anti-Embolism Compression Device Ordered  - Anticoagulant Not Indicated due to Low Risk for VTE     Discharge Planning: pending clinical course   JESSICA POE SHUMATE, NP  ------------------------------------------------------------------------------- Attestation signed by [name], MD at 08/27/2023  4:17 PM [facility] Service Attending  Attending Vilinda H. Alexander) Hospital Day: 5  I  personally saw Luis Hardin and performed a substantive portion of the encounter, including complete performance of at least one of the key components (history, physical exam, or medical decision making), in conjunction with the Advanced Practice Provider or House-staff for as documented above.  Summary Ambulating. Feels well. CABG next week.   Luis H. Marsa, MD, MHS Professor of Medicine [facility] [phone] -------------------------------------------------------------------------------

## 2023-08-28 ENCOUNTER — Telehealth: Payer: Self-pay

## 2023-08-28 NOTE — Telephone Encounter (Signed)
 The FMLA forms for this patient have been put in Dr. Denver box instructed by Callie. Patient was scheduled to come in tomorrow to see Callie, however pt is currently in [facility].

## 2023-08-28 NOTE — Telephone Encounter (Signed)
 Patient has FMLA forms that will be placed in Dr. Denver box for completion.

## 2023-08-29 ENCOUNTER — Ambulatory Visit: Admitting: Student

## 2023-08-30 NOTE — Telephone Encounter (Signed)
 Patient cancelled his visit with me on 08/29/2023 because he is currently admitted at [facility] and undergoing surgery for his anomalous LAD. I have never seen the patient so am unable to fill out FMLA paperwork. Recommended putting paperwork back in primary Cardiologist box. His primary Cardiologist is Dr. Lavona and he recently saw him in 07/2023.   Tamorah Hada E Makala Fetterolf, PA-C 08/30/2023 6:48 PM

## 2023-08-31 NOTE — Telephone Encounter (Signed)
 Left voice message to call back 7/31

## 2023-09-04 NOTE — Telephone Encounter (Signed)
 FMLA papers given to Hochrein 8/4. Paperwork completed and place in completed FMLA papers mail box.

## 2023-09-07 NOTE — Telephone Encounter (Signed)
 I have left several messages for Luis Hardin to call me regarding the FMLA paperwork for her job.  Her father has to sign the release of information in order for us  to send this form to her employer.  I left another message today and told her that I will send the blank form back to her until she is ready for us  to send to her employer; meaning Mr. Egger has to sign the release. And she needs to pay the $29 form fee.

## 2023-09-11 NOTE — Telephone Encounter (Signed)
 I just received an e-mail from [employer].  She asked that we disregard the form because her father decided not to sign the release.  I informed her that if she needed anything in the future to let us  know.  I scanned the completed form into the patient's chart but did NOT send to  Jessica's employer due to lack of release of information.

## 2023-09-25 ENCOUNTER — Encounter (HOSPITAL_COMMUNITY)
Admission: RE | Admit: 2023-09-25 | Discharge: 2023-09-25 | Disposition: A | Discharge status: Home / Self Care | Source: Ambulatory Visit | Attending: Internal Medicine | Admitting: Internal Medicine

## 2023-09-25 DIAGNOSIS — I214 Non-ST elevation (NSTEMI) myocardial infarction: Secondary | ICD-10-CM | POA: Insufficient documentation

## 2023-09-25 DIAGNOSIS — Z951 Presence of aortocoronary bypass graft: Secondary | ICD-10-CM | POA: Insufficient documentation

## 2023-09-25 NOTE — Progress Notes (Signed)
 Virtual orientation visit completed for cardiac rehab with NSTEMI/ CABGx1. On-site orientation visit scheduled for 08/28/23 at 8:30.

## 2023-09-28 ENCOUNTER — Encounter (HOSPITAL_COMMUNITY)
Admission: RE | Admit: 2023-09-28 | Discharge: 2023-09-28 | Disposition: A | Discharge status: Home / Self Care | Source: Ambulatory Visit | Attending: Internal Medicine | Admitting: Internal Medicine

## 2023-09-28 VITALS — Ht 68.0 in | Wt 214.9 lb

## 2023-09-28 DIAGNOSIS — Z951 Presence of aortocoronary bypass graft: Secondary | ICD-10-CM

## 2023-09-28 DIAGNOSIS — I214 Non-ST elevation (NSTEMI) myocardial infarction: Secondary | ICD-10-CM

## 2023-09-28 NOTE — Patient Instructions (Signed)
 Patient Instructions  Patient Details  Name: Luis Hardin MRN: [id number] Date of Birth: 1965-11-09 Referring Provider:  Mady Bruckner, MD  Below are your personal goals for exercise, nutrition, and risk factors. Our goal is to help you stay on track towards obtaining and maintaining these goals. We will be discussing your progress on these goals with you throughout the program.  Initial Exercise Prescription:  Initial Exercise Prescription - 09/28/23 1000       Date of Initial Exercise RX and Referring Provider   Date 09/28/23    Referring Provider End, Christopher MD   Primary Cardiologist: Dr. Diannah Proffer Mallipeddi     Oxygen   Maintain Oxygen Saturation 88% or higher      Treadmill   MPH 2.5    Grade 1    Minutes 15    METs 3.26      Bike   Level 2    Minutes 15    METs 3.5      REL-XR   Level 3    Speed 50    Minutes 15    METs 3.5      Prescription Details   Frequency (times per week) 4    Duration Progress to 30 minutes of continuous aerobic without signs/symptoms of physical distress      Intensity   THRR 40-80% of Max Heartrate 113-146    Ratings of Perceived Exertion 11-13    Perceived Dyspnea 0-4      Progression   Progression Continue to progress workloads to maintain intensity without signs/symptoms of physical distress.      Resistance Training   Training Prescription Yes    Weight 4 lb    Reps 10-15          Exercise Goals: Frequency: Be able to perform aerobic exercise two to three times per week in program working toward 2-5 days per week of home exercise.  Intensity: Work with a perceived exertion of 11 (fairly light) - 15 (hard) while following your exercise prescription.  We will make changes to your prescription with you as you progress through the program.   Duration: Be able to do 30 to 45 minutes of continuous aerobic exercise in addition to a 5 minute warm-up and a 5 minute cool-down routine.   Nutrition Goals: Your  personal nutrition goals will be established when you do your nutrition analysis with the dietician.  The following are general nutrition guidelines to follow: Cholesterol < 200mg /day Sodium < 1500mg /day Fiber: Men over 50 yrs - 30 grams per day  Personal Goals:  Personal Goals and Risk Factors at Admission - 09/28/23 1022       Core Components/Risk Factors/Patient Goals on Admission    Weight Management Obesity;Weight Maintenance;Yes    Intervention Weight Management: Develop a combined nutrition and exercise program designed to reach desired caloric intake, while maintaining appropriate intake of nutrient and fiber, sodium and fats, and appropriate energy expenditure required for the weight goal.;Weight Management: Provide education and appropriate resources to help participant work on and attain dietary goals.;Weight Management/Obesity: Establish reasonable short term and long term weight goals.;Obesity: Provide education and appropriate resources to help participant work on and attain dietary goals.    Admit Weight 214 lb 14.4 oz (97.5 kg)    Goal Weight: Short Term 209 lb (94.8 kg)    Goal Weight: Long Term 200 lb (90.7 kg)    Expected Outcomes Short Term: Continue to assess and modify interventions until short term weight is achieved;Long  Term: Adherence to nutrition and physical activity/exercise program aimed toward attainment of established weight goal;Weight Loss: Understanding of general recommendations for a balanced deficit meal plan, which promotes 1-2 lb weight loss per week and includes a negative energy balance of [phone] kcal/d;Understanding recommendations for meals to include 15-35% energy as protein, 25-35% energy from fat, 35-60% energy from carbohydrates, less than 200mg  of dietary cholesterol, 20-35 gm of total fiber daily;Understanding of distribution of calorie intake throughout the day with the consumption of 4-5 meals/snacks    Hypertension Yes    Intervention Provide  education on lifestyle modifcations including regular physical activity/exercise, weight management, moderate sodium restriction and increased consumption of fresh fruit, vegetables, and low fat dairy, alcohol moderation, and smoking cessation.;Monitor prescription use compliance.    Expected Outcomes Short Term: Continued assessment and intervention until BP is < 140/4mm HG in hypertensive participants. < 130/23mm HG in hypertensive participants with diabetes, heart failure or chronic kidney disease.;Long Term: Maintenance of blood pressure at goal levels.    Lipids Yes    Intervention Provide education and support for participant on nutrition & aerobic/resistive exercise along with prescribed medications to achieve LDL 70mg , HDL >40mg .    Expected Outcomes Short Term: Participant states understanding of desired cholesterol values and is compliant with medications prescribed. Participant is following exercise prescription and nutrition guidelines.;Long Term: Cholesterol controlled with medications as prescribed, with individualized exercise RX and with personalized nutrition plan. Value goals: LDL < 70mg , HDL > 40 mg.          Tobacco Use Initial Evaluation: Social History   Tobacco Use  Smoking Status Former   Current packs/day: 0.00   Average packs/day: 0.3 packs/day for 5.0 years (1.3 ttl pk-yrs)   Types: Cigarettes   Start date: 02/01/1983   Quit date: 02/01/1988   Years since quitting: 35.6  Smokeless Tobacco Never    Exercise Goals and Review:  Exercise Goals     Row Name 09/28/23 1021             Exercise Goals   Increase Physical Activity Yes       Intervention Provide advice, education, support and counseling about physical activity/exercise needs.;Develop an individualized exercise prescription for aerobic and resistive training based on initial evaluation findings, risk stratification, comorbidities and participant's personal goals.       Expected Outcomes Short Term:  Attend rehab on a regular basis to increase amount of physical activity.;Long Term: Exercising regularly at least 3-5 days a week.;Long Term: Add in home exercise to make exercise part of routine and to increase amount of physical activity.       Increase Strength and Stamina Yes       Intervention Provide advice, education, support and counseling about physical activity/exercise needs.;Develop an individualized exercise prescription for aerobic and resistive training based on initial evaluation findings, risk stratification, comorbidities and participant's personal goals.       Expected Outcomes Short Term: Increase workloads from initial exercise prescription for resistance, speed, and METs.;Short Term: Perform resistance training exercises routinely during rehab and add in resistance training at home;Long Term: Improve cardiorespiratory fitness, muscular endurance and strength as measured by increased METs and functional capacity ( )       Able to understand and use rate of perceived exertion (RPE) scale Yes       Intervention Provide education and explanation on how to use RPE scale       Expected Outcomes Short Term: Able to use RPE daily in rehab to express  subjective intensity level;Long Term:  Able to use RPE to guide intensity level when exercising independently       Able to understand and use Dyspnea scale Yes       Intervention Provide education and explanation on how to use Dyspnea scale       Expected Outcomes Short Term: Able to use Dyspnea scale daily in rehab to express subjective sense of shortness of breath during exertion;Long Term: Able to use Dyspnea scale to guide intensity level when exercising independently       Knowledge and understanding of Target Heart Rate Range (THRR) Yes       Intervention Provide education and explanation of THRR including how the numbers were predicted and where they are located for reference       Expected Outcomes Short Term: Able to state/look up  THRR;Long Term: Able to use THRR to govern intensity when exercising independently;Short Term: Able to use daily as guideline for intensity in rehab       Able to check pulse independently Yes       Intervention Provide education and demonstration on how to check pulse in carotid and radial arteries.;Review the importance of being able to check your own pulse for safety during independent exercise       Expected Outcomes Short Term: Able to explain why pulse checking is important during independent exercise;Long Term: Able to check pulse independently and accurately       Understanding of Exercise Prescription Yes       Intervention Provide education, explanation, and written materials on patient's individual exercise prescription       Expected Outcomes Short Term: Able to explain program exercise prescription;Long Term: Able to explain home exercise prescription to exercise independently        Copy of goals given to participant.

## 2023-09-28 NOTE — Progress Notes (Signed)
 Cardiac Individual Treatment Plan  Patient Details  Name: Luis Hardin MRN: [id number] Date of Birth: December 17, 1965 Referring Provider:   Flowsheet Row CARDIAC REHAB PHASE II ORIENTATION from 09/28/2023 in [facility] CARDIAC REHABILITATION  Referring Provider End, Lonni MD  [facility] Cardiologist: Dr. Diannah Proffer Mallipeddi]    Initial Encounter Date:  Flowsheet Row CARDIAC REHAB PHASE II ORIENTATION from 09/28/2023 in [facility] CARDIAC REHABILITATION  Date 09/28/23    Visit Diagnosis: NSTEMI (non-ST elevated myocardial infarction) (HCC)  S/P CABG x 1  Patient's Home Medications on Admission:  Current Outpatient Medications:    acetaminophen  (TYLENOL ) 500 MG tablet, Take 1,000 mg by mouth every 6 (six) hours as needed for moderate pain., Disp: , Rfl:    aspirin  EC 81 MG tablet, Take 1 tablet (81 mg total) by mouth daily. Swallow whole., Disp: 30 tablet, Rfl: 12   atorvastatin  (LIPITOR) 20 MG tablet, Take 1 tablet (20 mg total) by mouth daily., Disp: 90 tablet, Rfl: 1   carvedilol  (COREG ) 6.25 MG tablet, Take 1 tablet (6.25 mg total) by mouth 2 (two) times daily with a meal. (Patient not taking: Reported on 09/25/2023), Disp: 60 tablet, Rfl: 0   metoprolol  tartrate (LOPRESSOR ) 25 MG tablet, Take 25 mg by mouth 2 (two) times daily., Disp: , Rfl:    Multiple Vitamin (MULTIVITAMIN) tablet, Take 1 tablet by mouth daily. (Patient not taking: Reported on 09/25/2023), Disp: , Rfl:    Omega-3 Fatty Acids (FISH OIL PO), Take 1 capsule by mouth daily. (Patient not taking: Reported on 09/25/2023), Disp: , Rfl:    omeprazole (PRILOSEC) 20 MG capsule, Take 20 mg by mouth daily., Disp: , Rfl:    triamcinolone cream (KENALOG) 0.1 %, Apply 1 Application topically 2 (two) times daily as needed (eczema). (Patient not taking: Reported on 09/25/2023), Disp: , Rfl:   Past Medical History: Past Medical History:  Diagnosis Date   Cancer (HCC) 2023   prostate cancer   GERD (gastroesophageal reflux  disease)    H/O hiatal hernia    Hyperlipidemia    Hypertension    Inguinal hernia    RIGHT   Tendonitis    KNEES    Tobacco Use: Social History   Tobacco Use  Smoking Status Former   Current packs/day: 0.00   Average packs/day: 0.3 packs/day for 5.0 years (1.3 ttl pk-yrs)   Types: Cigarettes   Start date: 02/01/1983   Quit date: 02/01/1988   Years since quitting: 35.6  Smokeless Tobacco Never    Labs: Review Flowsheet       Latest Ref Rng & Units 05/17/2023 08/13/2023  Labs for ITP Cardiac and Pulmonary Rehab  Cholestrol 0 - 200 mg/dL 848  91   LDL (calc) 0 - 99 mg/dL 90  40   HDL-C >59 mg/dL 30  35   Trlycerides <849 mg/dL 845  81      Exercise Target Goals: Exercise Program Goal: Individual exercise prescription set using results from initial 6 min walk test and THRR while considering  patient's activity barriers and safety.   Exercise Prescription Goal: Initial exercise prescription builds to 30-45 minutes a day of aerobic activity, 2-3 days per week.  Home exercise guidelines will be given to patient during program as part of exercise prescription that the participant will acknowledge.   Education: Aerobic Exercise: - Group verbal and visual presentation on the components of exercise prescription. Introduces F.I.T.T principle from ACSM for exercise prescriptions.  Reviews F.I.T.T. principles of aerobic exercise including progression. Written material  provided at class time.   Education: Resistance Exercise: - Group verbal and visual presentation on the components of exercise prescription. Introduces F.I.T.T principle from ACSM for exercise prescriptions  Reviews F.I.T.T. principles of resistance exercise including progression. Written material provided at class time.    Education: Exercise & Equipment Safety: - Individual verbal instruction and demonstration of equipment use and safety with use of the equipment.   Education: Exercise Physiology & General  Exercise Guidelines: - Group verbal and written instruction with models to review the exercise physiology of the cardiovascular system and associated critical values. Provides general exercise guidelines with specific guidelines to those with heart or lung disease. Written material provided at class time.   Education: Flexibility, Balance, Mind/Body Relaxation: - Group verbal and visual presentation with interactive activity on the components of exercise prescription. Introduces F.I.T.T principle from ACSM for exercise prescriptions. Reviews F.I.T.T. principles of flexibility and balance exercise training including progression. Also discusses the mind body connection.  Reviews various relaxation techniques to help reduce and manage stress (i.e. Deep breathing, progressive muscle relaxation, and visualization). Balance handout provided to take home. Written material provided at class time.   Activity Barriers & Risk Stratification:  Activity Barriers & Cardiac Risk Stratification - 09/25/23 1332       Activity Barriers & Cardiac Risk Stratification   Activity Barriers None    Cardiac Risk Stratification Moderate          6 Minute Walk:  6 Minute Walk     Row Name 09/28/23 1018         6 Minute Walk   Phase Initial     Distance 1350 feet     Walk Time 6 minutes     # of Rest Breaks 0     MPH 2.56     METS 3.41     RPE 7     VO2 Peak 11.93     Symptoms No     Resting HR 80 bpm     Resting BP 100/70     Resting Oxygen Saturation  99 %     Exercise Oxygen Saturation  during 6 min walk 100 %     Max Ex. HR 110 bpm     Max Ex. BP 126/74     2 Minute Post BP 122/74        Oxygen Initial Assessment:   Oxygen Re-Evaluation:   Oxygen Discharge (Final Oxygen Re-Evaluation):   Initial Exercise Prescription:  Initial Exercise Prescription - 09/28/23 1000       Date of Initial Exercise RX and Referring Provider   Date 09/28/23    Referring Provider End, Christopher MD    Primary Cardiologist: Dr. Diannah Proffer Mallipeddi     Oxygen   Maintain Oxygen Saturation 88% or higher      Treadmill   MPH 2.5    Grade 1    Minutes 15    METs 3.26      Bike   Level 2    Minutes 15    METs 3.5      REL-XR   Level 3    Speed 50    Minutes 15    METs 3.5      Prescription Details   Frequency (times per week) 4    Duration Progress to 30 minutes of continuous aerobic without signs/symptoms of physical distress      Intensity   THRR 40-80% of Max Heartrate 113-146    Ratings of Perceived Exertion 11-13  Perceived Dyspnea 0-4      Progression   Progression Continue to progress workloads to maintain intensity without signs/symptoms of physical distress.      Resistance Training   Training Prescription Yes    Weight 4 lb    Reps 10-15          Perform Capillary Blood Glucose checks as needed.  Exercise Prescription Changes:   Exercise Prescription Changes     Row Name 09/28/23 1000             Response to Exercise   Blood Pressure (Admit) 100/70       Blood Pressure (Exercise) 126/74       Blood Pressure (Exit) 122/74       Heart Rate (Admit) 80 bpm       Heart Rate (Exercise) 110 bpm       Heart Rate (Exit) 83 bpm       Oxygen Saturation (Admit) 99 %       Oxygen Saturation (Exercise) 100 %       Rating of Perceived Exertion (Exercise) 7       Symptoms none       Comments walk test results          Exercise Comments:   Exercise Goals and Review:   Exercise Goals     Row Name 09/28/23 1021             Exercise Goals   Increase Physical Activity Yes       Intervention Provide advice, education, support and counseling about physical activity/exercise needs.;Develop an individualized exercise prescription for aerobic and resistive training based on initial evaluation findings, risk stratification, comorbidities and participant's personal goals.       Expected Outcomes Short Term: Attend rehab on a regular basis to  increase amount of physical activity.;Long Term: Exercising regularly at least 3-5 days a week.;Long Term: Add in home exercise to make exercise part of routine and to increase amount of physical activity.       Increase Strength and Stamina Yes       Intervention Provide advice, education, support and counseling about physical activity/exercise needs.;Develop an individualized exercise prescription for aerobic and resistive training based on initial evaluation findings, risk stratification, comorbidities and participant's personal goals.       Expected Outcomes Short Term: Increase workloads from initial exercise prescription for resistance, speed, and METs.;Short Term: Perform resistance training exercises routinely during rehab and add in resistance training at home;Long Term: Improve cardiorespiratory fitness, muscular endurance and strength as measured by increased METs and functional capacity ( )       Able to understand and use rate of perceived exertion (RPE) scale Yes       Intervention Provide education and explanation on how to use RPE scale       Expected Outcomes Short Term: Able to use RPE daily in rehab to express subjective intensity level;Long Term:  Able to use RPE to guide intensity level when exercising independently       Able to understand and use Dyspnea scale Yes       Intervention Provide education and explanation on how to use Dyspnea scale       Expected Outcomes Short Term: Able to use Dyspnea scale daily in rehab to express subjective sense of shortness of breath during exertion;Long Term: Able to use Dyspnea scale to guide intensity level when exercising independently       Knowledge and understanding of Target Heart Rate  Range (THRR) Yes       Intervention Provide education and explanation of THRR including how the numbers were predicted and where they are located for reference       Expected Outcomes Short Term: Able to state/look up THRR;Long Term: Able to use THRR to  govern intensity when exercising independently;Short Term: Able to use daily as guideline for intensity in rehab       Able to check pulse independently Yes       Intervention Provide education and demonstration on how to check pulse in carotid and radial arteries.;Review the importance of being able to check your own pulse for safety during independent exercise       Expected Outcomes Short Term: Able to explain why pulse checking is important during independent exercise;Long Term: Able to check pulse independently and accurately       Understanding of Exercise Prescription Yes       Intervention Provide education, explanation, and written materials on patient's individual exercise prescription       Expected Outcomes Short Term: Able to explain program exercise prescription;Long Term: Able to explain home exercise prescription to exercise independently          Exercise Goals Re-Evaluation :   Discharge Exercise Prescription (Final Exercise Prescription Changes):  Exercise Prescription Changes - 09/28/23 1000       Response to Exercise   Blood Pressure (Admit) 100/70    Blood Pressure (Exercise) 126/74    Blood Pressure (Exit) 122/74    Heart Rate (Admit) 80 bpm    Heart Rate (Exercise) 110 bpm    Heart Rate (Exit) 83 bpm    Oxygen Saturation (Admit) 99 %    Oxygen Saturation (Exercise) 100 %    Rating of Perceived Exertion (Exercise) 7    Symptoms none    Comments walk test results          Nutrition:  Target Goals: Understanding of nutrition guidelines, daily intake of sodium 1500mg , cholesterol 200mg , calories 30% from fat and 7% or less from saturated fats, daily to have 5 or more servings of fruits and vegetables.  Education: Nutrition 1 -Group instruction provided by verbal, written material, interactive activities, discussions, models, and posters to present general guidelines for heart healthy nutrition including macronutrients, label reading, and promoting whole  foods over processed counterparts. Education serves as Pensions consultant of discussion of heart healthy eating for all. Written material provided at class time.    Education: Nutrition 2 -Group instruction provided by verbal, written material, interactive activities, discussions, models, and posters to present general guidelines for heart healthy nutrition including sodium, cholesterol, and saturated fat. Providing guidance of habit forming to improve blood pressure, cholesterol, and body weight. Written material provided at class time.     Biometrics:  Pre Biometrics - 09/28/23 1021       Pre Biometrics   Height 5' 8 (1.727 m)    Weight 97.5 kg    Waist Circumference 43 inches    Hip Circumference 40.25 inches    Waist to Hip Ratio 1.07 %    BMI (Calculated) 32.68    Grip Strength 47.8 kg    Single Leg Stand 30 seconds           Nutrition Therapy Plan and Nutrition Goals:   Nutrition Assessments:  MEDIFICTS Score Key: >=70 Need to make dietary changes  40-70 Heart Healthy Diet <= 40 Therapeutic Level Cholesterol Diet  Flowsheet Row CARDIAC REHAB PHASE II ORIENTATION from 09/28/2023 in [place]  [facility]  Picture Your Plate Total Score on Admission 41   Picture Your Plate Scores: <59 Unhealthy dietary pattern with much room for improvement. 41-50 Dietary pattern unlikely to meet recommendations for good health and room for improvement. 51-60 More healthful dietary pattern, with some room for improvement.  >60 Healthy dietary pattern, although there may be some specific behaviors that could be improved.    Nutrition Goals Re-Evaluation:   Nutrition Goals Discharge (Final Nutrition Goals Re-Evaluation):   Psychosocial: Target Goals: Acknowledge presence or absence of significant depression and/or stress, maximize coping skills, provide positive support system. Participant is able to verbalize types and ability to use techniques and skills needed for  reducing stress and depression.   Education: Stress, Anxiety, and Depression - Group verbal and visual presentation to define topics covered.  Reviews how body is impacted by stress, anxiety, and depression.  Also discusses healthy ways to reduce stress and to treat/manage anxiety and depression. Written material provided at class time.   Education: Sleep Hygiene -Provides group verbal and written instruction about how sleep can affect your health.  Define sleep hygiene, discuss sleep cycles and impact of sleep habits. Review good sleep hygiene tips.   Initial Review & Psychosocial Screening:  Initial Psych Review & Screening - 09/25/23 1354       Initial Review   Current issues with None Identified      Family Dynamics   Good Support System? Yes    Comments Patient's wife and daughter are his main support and some friends.      Barriers   Psychosocial barriers to participate in program The patient should benefit from training in stress management and relaxation.;There are no identifiable barriers or psychosocial needs.      Screening Interventions   Interventions To provide support and resources with identified psychosocial needs;Encouraged to exercise;Provide feedback about the scores to participant    Expected Outcomes Short Term goal: Utilizing psychosocial counselor, staff and physician to assist with identification of specific Stressors or current issues interfering with healing process. Setting desired goal for each stressor or current issue identified.;Long Term Goal: Stressors or current issues are controlled or eliminated.;Short Term goal: Identification and review with participant of any Quality of Life or Depression concerns found by scoring the questionnaire.;Long Term goal: The participant improves quality of Life and PHQ9 Scores as seen by post scores and/or verbalization of changes          Quality of Life Scores:   Quality of Life - 09/28/23 1115       Quality of  Life   Select Quality of Life      Quality of Life Scores   Health/Function Pre 29.57 %    Socioeconomic Pre 30 %    Psych/Spiritual Pre 30 %    Family Pre 30 %    GLOBAL Pre 29.82 %         Scores of 19 and below usually indicate a poorer quality of life in these areas.  A difference of  2-3 points is a clinically meaningful difference.  A difference of 2-3 points in the total score of the Quality of Life Index has been associated with significant improvement in overall quality of life, self-image, physical symptoms, and general health in studies assessing change in quality of life.  PHQ-9: Review Flowsheet       09/28/2023  Depression screen PHQ 2/9  Decreased Interest 0  Down, Depressed, Hopeless 0  PHQ - 2 Score 0  Altered sleeping 0  Tired, decreased energy 0  Change in appetite 0  Feeling bad or failure about yourself  0  Trouble concentrating 0  Moving slowly or fidgety/restless 0  Suicidal thoughts 0  PHQ-9 Score 0   Interpretation of Total Score  Total Score Depression Severity:  1-4 = Minimal depression, 5-9 = Mild depression, 10-14 = Moderate depression, 15-19 = Moderately severe depression, 20-27 = Severe depression   Psychosocial Evaluation and Intervention:  Psychosocial Evaluation - 09/25/23 1355       Psychosocial Evaluation & Interventions   Interventions Stress management education;Relaxation education;Encouraged to exercise with the program and follow exercise prescription    Comments Patient was referred to cardiac rehab with NSTEMI 7/12 and he had CABGx1 at [facility] 7/29. He has been doing well since his surgery. He denies any depression or anxiety. He sleeps well at night. He works for a company in [employer] and plans to return to work 10/20 and hopes to have finished most of his CR by this date. His goals for the program are to get back to work and doing all the activities he wants to do and to learn what he is able to do. He has no barriers identified to  complete the program.    Expected Outcomes Short Term: Patient will start the program and attend consistently. Long Term: Patient will complete the program meeting personal goals.    Continue Psychosocial Services  Follow up required by staff          Psychosocial Re-Evaluation:   Psychosocial Discharge (Final Psychosocial Re-Evaluation):   Vocational Rehabilitation: Provide vocational rehab assistance to qualifying candidates.   Vocational Rehab Evaluation & Intervention:  Vocational Rehab - 09/25/23 1353       Initial Vocational Rehab Evaluation & Intervention   Assessment shows need for Vocational Rehabilitation No      Vocational Rehab Re-Evaulation   Comments Patient plans to return to his job.          Education: Education Goals: Education classes will be provided on a variety of topics geared toward better understanding of heart health and risk factor modification. Participant will state understanding/return demonstration of topics presented as noted by education test scores.  Learning Barriers/Preferences:  Learning Barriers/Preferences - 09/25/23 1353       Learning Barriers/Preferences   Learning Barriers None    Learning Preferences Skilled Demonstration          General Cardiac Education Topics:  AED/CPR: - Group verbal and written instruction with the use of models to demonstrate the basic use of the AED with the basic ABC's of resuscitation.   Test and Procedures: - Group verbal and visual presentation and models provide information about basic cardiac anatomy and function. Reviews the testing methods done to diagnose heart disease and the outcomes of the test results. Describes the treatment choices: Medical Management, Angioplasty, or Coronary Bypass Surgery for treating various heart conditions including Myocardial Infarction, Angina, Valve Disease, and Cardiac Arrhythmias. Written material provided at class time.   Medication Safety: - Group  verbal and visual instruction to review commonly prescribed medications for heart and lung disease. Reviews the medication, class of the drug, and side effects. Includes the steps to properly store meds and maintain the prescription regimen. Written material provided at class time.   Intimacy: - Group verbal instruction through game format to discuss how heart and lung disease can affect sexual intimacy. Written material provided at class time.   Know Your Numbers and Heart  Failure: - Group verbal and visual instruction to discuss disease risk factors for cardiac and pulmonary disease and treatment options.  Reviews associated critical values for Overweight/Obesity, Hypertension, Cholesterol, and Diabetes.  Discusses basics of heart failure: signs/symptoms and treatments.  Introduces Heart Failure Zone chart for action plan for heart failure. Written material provided at class time.   Infection Prevention: - Provides verbal and written material to individual with discussion of infection control including proper hand washing and proper equipment cleaning during exercise session.   Falls Prevention: - Provides verbal and written material to individual with discussion of falls prevention and safety.   Other: -Provides group and verbal instruction on various topics (see comments)   Knowledge Questionnaire Score:  Knowledge Questionnaire Score - 09/28/23 1100       Knowledge Questionnaire Score   Pre Score 18/26          Core Components/Risk Factors/Patient Goals at Admission:  Personal Goals and Risk Factors at Admission - 09/28/23 1022       Core Components/Risk Factors/Patient Goals on Admission    Weight Management Obesity;Weight Maintenance;Yes    Intervention Weight Management: Develop a combined nutrition and exercise program designed to reach desired caloric intake, while maintaining appropriate intake of nutrient and fiber, sodium and fats, and appropriate energy  expenditure required for the weight goal.;Weight Management: Provide education and appropriate resources to help participant work on and attain dietary goals.;Weight Management/Obesity: Establish reasonable short term and long term weight goals.;Obesity: Provide education and appropriate resources to help participant work on and attain dietary goals.    Admit Weight 214 lb 14.4 oz (97.5 kg)    Goal Weight: Short Term 209 lb (94.8 kg)    Goal Weight: Long Term 200 lb (90.7 kg)    Expected Outcomes Short Term: Continue to assess and modify interventions until short term weight is achieved;Long Term: Adherence to nutrition and physical activity/exercise program aimed toward attainment of established weight goal;Weight Loss: Understanding of general recommendations for a balanced deficit meal plan, which promotes 1-2 lb weight loss per week and includes a negative energy balance of [phone] kcal/d;Understanding recommendations for meals to include 15-35% energy as protein, 25-35% energy from fat, 35-60% energy from carbohydrates, less than 200mg  of dietary cholesterol, 20-35 gm of total fiber daily;Understanding of distribution of calorie intake throughout the day with the consumption of 4-5 meals/snacks    Hypertension Yes    Intervention Provide education on lifestyle modifcations including regular physical activity/exercise, weight management, moderate sodium restriction and increased consumption of fresh fruit, vegetables, and low fat dairy, alcohol moderation, and smoking cessation.;Monitor prescription use compliance.    Expected Outcomes Short Term: Continued assessment and intervention until BP is < 140/22mm HG in hypertensive participants. < 130/64mm HG in hypertensive participants with diabetes, heart failure or chronic kidney disease.;Long Term: Maintenance of blood pressure at goal levels.    Lipids Yes    Intervention Provide education and support for participant on nutrition & aerobic/resistive  exercise along with prescribed medications to achieve LDL 70mg , HDL >40mg .    Expected Outcomes Short Term: Participant states understanding of desired cholesterol values and is compliant with medications prescribed. Participant is following exercise prescription and nutrition guidelines.;Long Term: Cholesterol controlled with medications as prescribed, with individualized exercise RX and with personalized nutrition plan. Value goals: LDL < 70mg , HDL > 40 mg.          Education:Diabetes - Individual verbal and written instruction to review signs/symptoms of diabetes, desired ranges of glucose level  fasting, after meals and with exercise. Acknowledge that pre and post exercise glucose checks will be done for 3 sessions at entry of program.   Core Components/Risk Factors/Patient Goals Review:    Core Components/Risk Factors/Patient Goals at Discharge (Final Review):    ITP Comments:  ITP Comments     Row Name 09/25/23 1403 09/28/23 1108         ITP Comments Virtual orientation visit completed for cardiac rehab with NSTEMI/ CABGx1. On-site orientation visit scheduled for 08/28/23 at 8:30. Patient arrived for 1st visit/orientation/education at 0830. Patient was referred to CR by Dr. Lonni End /Attending Dr. Mallipeddi due to NSTEMI/CABGx1. During orientation advised patient on arrival and appointment times what to wear, what to do before, during and after exercise. Reviewed attendance and class policy.  Pt is scheduled to return Cardiac Rehab on 09/29/23 at 747. Pt was advised to come to class 15 minutes before class starts.  Discussed RPE/Dpysnea scales. Patient participated in warm up stretches. Patient was able to complete 6 minute walk test.  Telemetry:NSR. Patient was measured for the equipment. Discussed equipment safety with patient. Took patient pre-anthropometric measurements. Patient finished visit at 1015.         Comments: Patient arrived for 1st visit/orientation/education  at 0830. Patient was referred to CR by Dr. Lonni End /Attending Dr. Mallipeddi due to NSTEMI/CABGx1. During orientation advised patient on arrival and appointment times what to wear, what to do before, during and after exercise. Reviewed attendance and class policy.  Pt is scheduled to return Cardiac Rehab on 09/29/23 at 747. Pt was advised to come to class 15 minutes before class starts.  Discussed RPE/Dpysnea scales. Patient participated in warm up stretches. Patient was able to complete 6 minute walk test.  Telemetry:NSR. Patient was measured for the equipment. Discussed equipment safety with patient. Took patient pre-anthropometric measurements. Patient finished visit at 1015.

## 2023-09-29 ENCOUNTER — Encounter (HOSPITAL_COMMUNITY)
Admission: RE | Admit: 2023-09-29 | Discharge: 2023-09-29 | Disposition: A | Discharge status: Home / Self Care | Source: Ambulatory Visit | Attending: Internal Medicine | Admitting: Internal Medicine

## 2023-09-29 DIAGNOSIS — I214 Non-ST elevation (NSTEMI) myocardial infarction: Secondary | ICD-10-CM

## 2023-09-29 DIAGNOSIS — Z951 Presence of aortocoronary bypass graft: Secondary | ICD-10-CM

## 2023-09-29 NOTE — Progress Notes (Signed)
 Daily Session Note  Patient Details  Name: Boston Cookson MRN: [id number] Date of Birth: 1965-09-07 Referring Provider:   Flowsheet Row CARDIAC REHAB PHASE II ORIENTATION from 09/28/2023 in [facility] CARDIAC REHABILITATION  Referring Provider End, Lonni MD  [facility] Cardiologist: Dr. Diannah Proffer Mallipeddi]    Encounter Date: 09/29/2023  Check In:  Session Check In - 09/29/23 0802       Check-In   Supervising physician immediately available to respond to emergencies See telemetry face sheet for immediately available MD    Staff Present Harlene Gelineau, MA, RCEP, CCRP, CCET;Rachel Holcomb, RN, BSN    Virtual Visit No    Medication changes reported     No    Fall or balance concerns reported    No    Warm-up and Cool-down Performed on first and last piece of equipment    Resistance Training Performed Yes    VAD Patient? No    PAD/SET Patient? No      Pain Assessment   Currently in Pain? No/denies          Capillary Blood Glucose: No results found for this or any previous visit (from the past 24 hours).    Social History   Tobacco Use  Smoking Status Former   Current packs/day: 0.00   Average packs/day: 0.3 packs/day for 5.0 years (1.3 ttl pk-yrs)   Types: Cigarettes   Start date: 02/01/1983   Quit date: 02/01/1988   Years since quitting: 35.6  Smokeless Tobacco Never    Goals Met:  Independence with exercise equipment Exercise tolerated well No report of concerns or symptoms today Strength training completed today  Goals Unmet:  Not Applicable  Comments: First full day of exercise!  Patient was oriented to gym and equipment including functions, settings, policies, and procedures.  Patient's individual exercise prescription and treatment plan were reviewed.  All starting workloads were established based on the results of the 6 minute walk test done at initial orientation visit.  The plan for exercise progression was also introduced and progression will be  customized based on patient's performance and goals.

## 2023-10-04 ENCOUNTER — Encounter (HOSPITAL_COMMUNITY)
Admission: RE | Admit: 2023-10-04 | Discharge: 2023-10-04 | Disposition: A | Discharge status: Home / Self Care | Source: Ambulatory Visit | Attending: Internal Medicine | Admitting: Internal Medicine

## 2023-10-04 DIAGNOSIS — Z951 Presence of aortocoronary bypass graft: Secondary | ICD-10-CM | POA: Diagnosis present

## 2023-10-04 DIAGNOSIS — I214 Non-ST elevation (NSTEMI) myocardial infarction: Secondary | ICD-10-CM | POA: Insufficient documentation

## 2023-10-04 NOTE — Progress Notes (Signed)
 Daily Session Note  Patient Details  Name: Luis Hardin MRN: [id number] Date of Birth: May 11, 1965 Referring Provider:   Flowsheet Row CARDIAC REHAB PHASE II ORIENTATION from 09/28/2023 in [facility] CARDIAC REHABILITATION  Referring Provider End, Lonni MD  [facility] Cardiologist: Dr. Diannah Proffer Mallipeddi]    Encounter Date: 10/04/2023  Check In:  Session Check In - 10/04/23 0804       Check-In   Supervising physician immediately available to respond to emergencies See telemetry face sheet for immediately available MD    Location [facility]    Staff Present Powell Benders, BS, Exercise Physiologist;Brittany Jackquline, BSN, RN, Rosalba Gelineau, MA, RCEP, CCRP, CCET;Victoria Elmo, RN    Virtual Visit No    Medication changes reported     No    Fall or balance concerns reported    No    Tobacco Cessation No Change    Warm-up and Cool-down Performed on first and last piece of equipment    Resistance Training Performed Yes    VAD Patient? No    PAD/SET Patient? No      Pain Assessment   Currently in Pain? No/denies    Multiple Pain Sites No          Capillary Blood Glucose: No results found for this or any previous visit (from the past 24 hours).    Social History   Tobacco Use  Smoking Status Former   Current packs/day: 0.00   Average packs/day: 0.3 packs/day for 5.0 years (1.3 ttl pk-yrs)   Types: Cigarettes   Start date: 02/01/1983   Quit date: 02/01/1988   Years since quitting: 35.6  Smokeless Tobacco Never    Goals Met:  Independence with exercise equipment Exercise tolerated well No report of concerns or symptoms today Strength training completed today  Goals Unmet:  Not Applicable  Comments: Pt able to follow exercise prescription today without complaint.  Will continue to monitor for progression.

## 2023-10-05 ENCOUNTER — Encounter (HOSPITAL_COMMUNITY)

## 2023-10-06 ENCOUNTER — Encounter (HOSPITAL_COMMUNITY)
Admission: RE | Admit: 2023-10-06 | Discharge: 2023-10-06 | Disposition: A | Discharge status: Home / Self Care | Source: Ambulatory Visit | Attending: Internal Medicine | Admitting: Internal Medicine

## 2023-10-06 DIAGNOSIS — I214 Non-ST elevation (NSTEMI) myocardial infarction: Secondary | ICD-10-CM

## 2023-10-06 DIAGNOSIS — Z951 Presence of aortocoronary bypass graft: Secondary | ICD-10-CM

## 2023-10-06 NOTE — Progress Notes (Signed)
 Daily Session Note  Patient Details  Name: Luis Hardin MRN: [id number] Date of Birth: August 12, 1965 Referring Provider:   Flowsheet Row CARDIAC REHAB PHASE II ORIENTATION from 09/28/2023 in [facility] CARDIAC REHABILITATION  Referring Provider End, Lonni MD  [facility] Cardiologist: Dr. Diannah Proffer Mallipeddi]    Encounter Date: 10/06/2023  Check In:  Session Check In - 10/06/23 0756       Check-In   Supervising physician immediately available to respond to emergencies See telemetry face sheet for immediately available MD    Location [facility]    Staff Present Powell Benders, BS, Exercise Physiologist;Brittany Jackquline, BSN, RN, Rosalba Gelineau, MA, RCEP, CCRP, CCET    Virtual Visit No    Medication changes reported     No    Fall or balance concerns reported    No    Tobacco Cessation No Change    Warm-up and Cool-down Performed on first and last piece of equipment    Resistance Training Performed Yes    VAD Patient? No    PAD/SET Patient? No      Pain Assessment   Currently in Pain? No/denies    Multiple Pain Sites No          Capillary Blood Glucose: No results found for this or any previous visit (from the past 24 hours).    Social History   Tobacco Use  Smoking Status Former   Current packs/day: 0.00   Average packs/day: 0.3 packs/day for 5.0 years (1.3 ttl pk-yrs)   Types: Cigarettes   Start date: 02/01/1983   Quit date: 02/01/1988   Years since quitting: 35.7  Smokeless Tobacco Never    Goals Met:  Independence with exercise equipment Exercise tolerated well No report of concerns or symptoms today Strength training completed today  Goals Unmet:  Not Applicable  Comments: Pt able to follow exercise prescription today without complaint.  Will continue to monitor for progression.

## 2023-10-09 ENCOUNTER — Encounter (HOSPITAL_COMMUNITY)
Admission: RE | Admit: 2023-10-09 | Discharge: 2023-10-09 | Disposition: A | Discharge status: Home / Self Care | Source: Ambulatory Visit | Attending: Internal Medicine | Admitting: Internal Medicine

## 2023-10-09 DIAGNOSIS — I214 Non-ST elevation (NSTEMI) myocardial infarction: Secondary | ICD-10-CM | POA: Diagnosis not present

## 2023-10-09 DIAGNOSIS — Z951 Presence of aortocoronary bypass graft: Secondary | ICD-10-CM

## 2023-10-09 NOTE — Progress Notes (Signed)
 Daily Session Note  Patient Details  Name: Luis Hardin MRN: [id number] Date of Birth: [birth year] Referring Provider:   Flowsheet Row CARDIAC REHAB PHASE II ORIENTATION from 09/28/2023 in [facility] CARDIAC REHABILITATION  Referring Provider End, Lonni MD  [facility] Cardiologist: Dr. Diannah Proffer Mallipeddi]    Encounter Date: 10/09/2023  Check In:  Session Check In - 10/09/23 0755       Check-In   Supervising physician immediately available to respond to emergencies See telemetry face sheet for immediately available MD    Location [facility]    Staff Present Laymon Rattler, BSN, RN, Rosalba Gelineau, MA, RCEP, CCRP, CCET    Virtual Visit No    Medication changes reported     No    Fall or balance concerns reported    No    Tobacco Cessation No Change    Warm-up and Cool-down Performed on first and last piece of equipment    Resistance Training Performed Yes    VAD Patient? No    PAD/SET Patient? No      Pain Assessment   Currently in Pain? No/denies          Capillary Blood Glucose: No results found for this or any previous visit (from the past 24 hours).    Social History   Tobacco Use  Smoking Status Former   Current packs/day: 0.00   Average packs/day: 0.3 packs/day for 5.0 years (1.3 ttl pk-yrs)   Types: Cigarettes   Start date: 02/01/1983   Quit date: 02/01/1988   Years since quitting: 35.7  Smokeless Tobacco Never    Goals Met:  Independence with exercise equipment Exercise tolerated well No report of concerns or symptoms today Strength training completed today  Goals Unmet:  Not Applicable  Comments: Pt able to follow exercise prescription today without complaint.  Will continue to monitor for progression.

## 2023-10-11 ENCOUNTER — Encounter (HOSPITAL_COMMUNITY): Payer: Self-pay | Admitting: *Deleted

## 2023-10-11 ENCOUNTER — Encounter (HOSPITAL_COMMUNITY)
Admission: RE | Admit: 2023-10-11 | Discharge: 2023-10-11 | Disposition: A | Discharge status: Home / Self Care | Source: Ambulatory Visit | Attending: Internal Medicine | Admitting: Internal Medicine

## 2023-10-11 DIAGNOSIS — I214 Non-ST elevation (NSTEMI) myocardial infarction: Secondary | ICD-10-CM

## 2023-10-11 DIAGNOSIS — Z951 Presence of aortocoronary bypass graft: Secondary | ICD-10-CM

## 2023-10-11 NOTE — Progress Notes (Signed)
 Daily Session Note  Patient Details  Name: Luis Hardin MRN: [id number] Date of Birth: May 05, 1965 Referring Provider:   Flowsheet Row CARDIAC REHAB PHASE II ORIENTATION from 09/28/2023 in [facility] CARDIAC REHABILITATION  Referring Provider End, Lonni MD  [facility] Cardiologist: Dr. Diannah Proffer Mallipeddi]    Encounter Date: 10/11/2023  Check In:  Session Check In - 10/11/23 0800       Check-In   Supervising physician immediately available to respond to emergencies See telemetry face sheet for immediately available MD    Location [facility]    Staff Present Laymon Rattler, BSN, RN, WTA-C;Heather Con, BS, Exercise Physiologist    Virtual Visit No    Medication changes reported     No    Fall or balance concerns reported    No    Tobacco Cessation No Change    Warm-up and Cool-down Performed on first and last piece of equipment    Resistance Training Performed Yes    VAD Patient? No    PAD/SET Patient? No      Pain Assessment   Currently in Pain? No/denies          Capillary Blood Glucose: No results found for this or any previous visit (from the past 24 hours).    Social History   Tobacco Use  Smoking Status Former   Current packs/day: 0.00   Average packs/day: 0.3 packs/day for 5.0 years (1.3 ttl pk-yrs)   Types: Cigarettes   Start date: 02/01/1983   Quit date: 02/01/1988   Years since quitting: 35.7  Smokeless Tobacco Never    Goals Met:  Independence with exercise equipment Exercise tolerated well No report of concerns or symptoms today Strength training completed today  Goals Unmet:  Not Applicable  Comments: Pt able to follow exercise prescription today without complaint.  Will continue to monitor for progression.

## 2023-10-11 NOTE — Progress Notes (Signed)
 Cardiac Individual Treatment Plan  Patient Details  Name: Luis Hardin MRN: [id number] Date of Birth: [birth year] Referring Provider:   Flowsheet Row CARDIAC REHAB PHASE II ORIENTATION from 09/28/2023 in [facility] CARDIAC REHABILITATION  Referring Provider End, Lonni MD  [facility] Cardiologist: Dr. Diannah Proffer Mallipeddi]    Initial Encounter Date:  Flowsheet Row CARDIAC REHAB PHASE II ORIENTATION from 09/28/2023 in [facility] CARDIAC REHABILITATION  Date 09/28/23    Visit Diagnosis: NSTEMI (non-ST elevated myocardial infarction) (HCC)  S/P CABG x 1  Patient's Home Medications on Admission:  Current Outpatient Medications:    acetaminophen  (TYLENOL ) 500 MG tablet, Take 1,000 mg by mouth every 6 (six) hours as needed for moderate pain., Disp: , Rfl:    aspirin  EC 81 MG tablet, Take 1 tablet (81 mg total) by mouth daily. Swallow whole., Disp: 30 tablet, Rfl: 12   atorvastatin  (LIPITOR) 20 MG tablet, Take 1 tablet (20 mg total) by mouth daily., Disp: 90 tablet, Rfl: 1   carvedilol  (COREG ) 6.25 MG tablet, Take 1 tablet (6.25 mg total) by mouth 2 (two) times daily with a meal. (Patient not taking: Reported on 09/25/2023), Disp: 60 tablet, Rfl: 0   metoprolol  tartrate (LOPRESSOR ) 25 MG tablet, Take 25 mg by mouth 2 (two) times daily., Disp: , Rfl:    Multiple Vitamin (MULTIVITAMIN) tablet, Take 1 tablet by mouth daily. (Patient not taking: Reported on 09/25/2023), Disp: , Rfl:    Omega-3 Fatty Acids (FISH OIL PO), Take 1 capsule by mouth daily. (Patient not taking: Reported on 09/25/2023), Disp: , Rfl:    omeprazole (PRILOSEC) 20 MG capsule, Take 20 mg by mouth daily., Disp: , Rfl:    triamcinolone cream (KENALOG) 0.1 %, Apply 1 Application topically 2 (two) times daily as needed (eczema). (Patient not taking: Reported on 09/25/2023), Disp: , Rfl:   Past Medical History: Past Medical History:  Diagnosis Date   Cancer (HCC) 2023   prostate cancer   GERD (gastroesophageal reflux  disease)    H/O hiatal hernia    Hyperlipidemia    Hypertension    Inguinal hernia    RIGHT   Tendonitis    KNEES    Tobacco Use: Social History   Tobacco Use  Smoking Status Former   Current packs/day: 0.00   Average packs/day: 0.3 packs/day for 5.0 years (1.3 ttl pk-yrs)   Types: Cigarettes   Start date: 02/01/1983   Quit date: 02/01/1988   Years since quitting: 35.7  Smokeless Tobacco Never    Labs: Review Flowsheet       Latest Ref Rng & Units 05/17/2023 08/13/2023  Labs for ITP Cardiac and Pulmonary Rehab  Cholestrol 0 - 200 mg/dL 848  91   LDL (calc) 0 - 99 mg/dL 90  40   HDL-C >59 mg/dL 30  35   Trlycerides <849 mg/dL 845  81     Capillary Blood Glucose: No results found for: GLUCAP   Exercise Target Goals: Exercise Program Goal: Individual exercise prescription set using results from initial 6 min walk test and THRR while considering  patient's activity barriers and safety.   Exercise Prescription Goal: Starting with aerobic activity 30 plus minutes a day, 3 days per week for initial exercise prescription. Provide home exercise prescription and guidelines that participant acknowledges understanding prior to discharge.  Activity Barriers & Risk Stratification:  Activity Barriers & Cardiac Risk Stratification - 09/25/23 1332       Activity Barriers & Cardiac Risk Stratification   Activity Barriers None  Cardiac Risk Stratification Moderate          6 Minute Walk:  6 Minute Walk     Row Name 09/28/23 1018         6 Minute Walk   Phase Initial     Distance 1350 feet     Walk Time 6 minutes     # of Rest Breaks 0     MPH 2.56     METS 3.41     RPE 7     VO2 Peak 11.93     Symptoms No     Resting HR 80 bpm     Resting BP 100/70     Resting Oxygen Saturation  99 %     Exercise Oxygen Saturation  during 6 min walk 100 %     Max Ex. HR 110 bpm     Max Ex. BP 126/74     2 Minute Post BP 122/74        Oxygen Initial  Assessment:   Oxygen Re-Evaluation:   Oxygen Discharge (Final Oxygen Re-Evaluation):   Initial Exercise Prescription:  Initial Exercise Prescription - 09/28/23 1000       Date of Initial Exercise RX and Referring Provider   Date 09/28/23    Referring Provider End, Christopher MD   Primary Cardiologist: Dr. Diannah Proffer Mallipeddi     Oxygen   Maintain Oxygen Saturation 88% or higher      Treadmill   MPH 2.5    Grade 1    Minutes 15    METs 3.26      Bike   Level 2    Minutes 15    METs 3.5      REL-XR   Level 3    Speed 50    Minutes 15    METs 3.5      Prescription Details   Frequency (times per week) 4    Duration Progress to 30 minutes of continuous aerobic without signs/symptoms of physical distress      Intensity   THRR 40-80% of Max Heartrate 113-146    Ratings of Perceived Exertion 11-13    Perceived Dyspnea 0-4      Progression   Progression Continue to progress workloads to maintain intensity without signs/symptoms of physical distress.      Resistance Training   Training Prescription Yes    Weight 4 lb    Reps 10-15          Perform Capillary Blood Glucose checks as needed.  Exercise Prescription Changes:   Exercise Prescription Changes     Row Name 09/28/23 1000             Response to Exercise   Blood Pressure (Admit) 100/70       Blood Pressure (Exercise) 126/74       Blood Pressure (Exit) 122/74       Heart Rate (Admit) 80 bpm       Heart Rate (Exercise) 110 bpm       Heart Rate (Exit) 83 bpm       Oxygen Saturation (Admit) 99 %       Oxygen Saturation (Exercise) 100 %       Rating of Perceived Exertion (Exercise) 7       Symptoms none       Comments walk test results          Exercise Comments:   Exercise Comments     Row Name 09/29/23 [phone]  Exercise Comments First full day of exercise!  Patient was oriented to gym and equipment including functions, settings, policies, and procedures.  Patient's  individual exercise prescription and treatment plan were reviewed.  All starting workloads were established based on the results of the 6 minute walk test done at initial orientation visit.  The plan for exercise progression was also introduced and progression will be customized based on patient's performance and goals.          Exercise Goals and Review:   Exercise Goals     Row Name 09/28/23 1021             Exercise Goals   Increase Physical Activity Yes       Intervention Provide advice, education, support and counseling about physical activity/exercise needs.;Develop an individualized exercise prescription for aerobic and resistive training based on initial evaluation findings, risk stratification, comorbidities and participant's personal goals.       Expected Outcomes Short Term: Attend rehab on a regular basis to increase amount of physical activity.;Long Term: Exercising regularly at least 3-5 days a week.;Long Term: Add in home exercise to make exercise part of routine and to increase amount of physical activity.       Increase Strength and Stamina Yes       Intervention Provide advice, education, support and counseling about physical activity/exercise needs.;Develop an individualized exercise prescription for aerobic and resistive training based on initial evaluation findings, risk stratification, comorbidities and participant's personal goals.       Expected Outcomes Short Term: Increase workloads from initial exercise prescription for resistance, speed, and METs.;Short Term: Perform resistance training exercises routinely during rehab and add in resistance training at home;Long Term: Improve cardiorespiratory fitness, muscular endurance and strength as measured by increased METs and functional capacity ( )       Able to understand and use rate of perceived exertion (RPE) scale Yes       Intervention Provide education and explanation on how to use RPE scale       Expected  Outcomes Short Term: Able to use RPE daily in rehab to express subjective intensity level;Long Term:  Able to use RPE to guide intensity level when exercising independently       Able to understand and use Dyspnea scale Yes       Intervention Provide education and explanation on how to use Dyspnea scale       Expected Outcomes Short Term: Able to use Dyspnea scale daily in rehab to express subjective sense of shortness of breath during exertion;Long Term: Able to use Dyspnea scale to guide intensity level when exercising independently       Knowledge and understanding of Target Heart Rate Range (THRR) Yes       Intervention Provide education and explanation of THRR including how the numbers were predicted and where they are located for reference       Expected Outcomes Short Term: Able to state/look up THRR;Long Term: Able to use THRR to govern intensity when exercising independently;Short Term: Able to use daily as guideline for intensity in rehab       Able to check pulse independently Yes       Intervention Provide education and demonstration on how to check pulse in carotid and radial arteries.;Review the importance of being able to check your own pulse for safety during independent exercise       Expected Outcomes Short Term: Able to explain why pulse checking is important during independent exercise;Long Term: Able  to check pulse independently and accurately       Understanding of Exercise Prescription Yes       Intervention Provide education, explanation, and written materials on patient's individual exercise prescription       Expected Outcomes Short Term: Able to explain program exercise prescription;Long Term: Able to explain home exercise prescription to exercise independently          Exercise Goals Re-Evaluation :  Exercise Goals Re-Evaluation     Row Name 09/29/23 0804             Exercise Goal Re-Evaluation   Exercise Goals Review Able to understand and use rate of perceived  exertion (RPE) scale;Able to understand and use Dyspnea scale;Knowledge and understanding of Target Heart Rate Range (THRR);Understanding of Exercise Prescription       Comments Reviewed RPE and dyspnea scale, THR and program prescription with pt today.  Pt voiced understanding and was given a copy of goals to take home.       Expected Outcomes Short: Use RPE daily to regulate intensity.  Long: Follow program prescription in THR.           Discharge Exercise Prescription (Final Exercise Prescription Changes):  Exercise Prescription Changes - 09/28/23 1000       Response to Exercise   Blood Pressure (Admit) 100/70    Blood Pressure (Exercise) 126/74    Blood Pressure (Exit) 122/74    Heart Rate (Admit) 80 bpm    Heart Rate (Exercise) 110 bpm    Heart Rate (Exit) 83 bpm    Oxygen Saturation (Admit) 99 %    Oxygen Saturation (Exercise) 100 %    Rating of Perceived Exertion (Exercise) 7    Symptoms none    Comments walk test results          Nutrition:  Target Goals: Understanding of nutrition guidelines, daily intake of sodium 1500mg , cholesterol 200mg , calories 30% from fat and 7% or less from saturated fats, daily to have 5 or more servings of fruits and vegetables.  Biometrics:  Pre Biometrics - 09/28/23 1021       Pre Biometrics   Height 5' 8 (1.727 m)    Weight 214 lb 14.4 oz (97.5 kg)    Waist Circumference 43 inches    Hip Circumference 40.25 inches    Waist to Hip Ratio 1.07 %    BMI (Calculated) 32.68    Grip Strength 47.8 kg    Single Leg Stand 30 seconds           Nutrition Therapy Plan and Nutrition Goals:   Nutrition Assessments:  MEDIFICTS Score Key: >=70 Need to make dietary changes  40-70 Heart Healthy Diet <= 40 Therapeutic Level Cholesterol Diet  Flowsheet Row CARDIAC REHAB PHASE II ORIENTATION from 09/28/2023 in [facility] CARDIAC REHABILITATION  Picture Your Plate Total Score on Admission 41   Picture Your Plate Scores: <59 Unhealthy  dietary pattern with much room for improvement. 41-50 Dietary pattern unlikely to meet recommendations for good health and room for improvement. 51-60 More healthful dietary pattern, with some room for improvement.  >60 Healthy dietary pattern, although there may be some specific behaviors that could be improved.    Nutrition Goals Re-Evaluation:   Nutrition Goals Discharge (Final Nutrition Goals Re-Evaluation):   Psychosocial: Target Goals: Acknowledge presence or absence of significant depression and/or stress, maximize coping skills, provide positive support system. Participant is able to verbalize types and ability to use techniques and skills needed for reducing  stress and depression.  Initial Review & Psychosocial Screening:  Initial Psych Review & Screening - 09/25/23 1354       Initial Review   Current issues with None Identified      Family Dynamics   Good Support System? Yes    Comments Patient's wife and daughter are his main support and some friends.      Barriers   Psychosocial barriers to participate in program The patient should benefit from training in stress management and relaxation.;There are no identifiable barriers or psychosocial needs.      Screening Interventions   Interventions To provide support and resources with identified psychosocial needs;Encouraged to exercise;Provide feedback about the scores to participant    Expected Outcomes Short Term goal: Utilizing psychosocial counselor, staff and physician to assist with identification of specific Stressors or current issues interfering with healing process. Setting desired goal for each stressor or current issue identified.;Long Term Goal: Stressors or current issues are controlled or eliminated.;Short Term goal: Identification and review with participant of any Quality of Life or Depression concerns found by scoring the questionnaire.;Long Term goal: The participant improves quality of Life and PHQ9 Scores as  seen by post scores and/or verbalization of changes          Quality of Life Scores:  Quality of Life - 09/28/23 1115       Quality of Life   Select Quality of Life      Quality of Life Scores   Health/Function Pre 29.57 %    Socioeconomic Pre 30 %    Psych/Spiritual Pre 30 %    Family Pre 30 %    GLOBAL Pre 29.82 %         Scores of 19 and below usually indicate a poorer quality of life in these areas.  A difference of  2-3 points is a clinically meaningful difference.  A difference of 2-3 points in the total score of the Quality of Life Index has been associated with significant improvement in overall quality of life, self-image, physical symptoms, and general health in studies assessing change in quality of life.  PHQ-9: Review Flowsheet       09/28/2023  Depression screen PHQ 2/9  Decreased Interest 0  Down, Depressed, Hopeless 0  PHQ - 2 Score 0  Altered sleeping 0  Tired, decreased energy 0  Change in appetite 0  Feeling bad or failure about yourself  0  Trouble concentrating 0  Moving slowly or fidgety/restless 0  Suicidal thoughts 0  PHQ-9 Score 0   Interpretation of Total Score  Total Score Depression Severity:  1-4 = Minimal depression, 5-9 = Mild depression, 10-14 = Moderate depression, 15-19 = Moderately severe depression, 20-27 = Severe depression   Psychosocial Evaluation and Intervention:  Psychosocial Evaluation - 09/25/23 1355       Psychosocial Evaluation & Interventions   Interventions Stress management education;Relaxation education;Encouraged to exercise with the program and follow exercise prescription    Comments Patient was referred to cardiac rehab with NSTEMI 7/12 and he had CABGx1 at [facility] 7/29. He has been doing well since his surgery. He denies any depression or anxiety. He sleeps well at night. He works for a company in [employer] and plans to return to work 10/20 and hopes to have finished most of his CR by this date. His goals for the  program are to get back to work and doing all the activities he wants to do and to learn what he is able to  do. He has no barriers identified to complete the program.    Expected Outcomes Short Term: Patient will start the program and attend consistently. Long Term: Patient will complete the program meeting personal goals.    Continue Psychosocial Services  Follow up required by staff          Psychosocial Re-Evaluation:   Psychosocial Discharge (Final Psychosocial Re-Evaluation):   Vocational Rehabilitation: Provide vocational rehab assistance to qualifying candidates.   Vocational Rehab Evaluation & Intervention:  Vocational Rehab - 09/25/23 1353       Initial Vocational Rehab Evaluation & Intervention   Assessment shows need for Vocational Rehabilitation No      Vocational Rehab Re-Evaulation   Comments Patient plans to return to his job.          Education: Education Goals: Education classes will be provided on a weekly basis, covering required topics. Participant will state understanding/return demonstration of topics presented.  Learning Barriers/Preferences:  Learning Barriers/Preferences - 09/25/23 1353       Learning Barriers/Preferences   Learning Barriers None    Learning Preferences Skilled Demonstration          Education Topics: Hypertension, Hypertension Reduction -Define heart disease and high blood pressure. Discus how high blood pressure affects the body and ways to reduce high blood pressure.   Exercise and Your Heart -Discuss why it is important to exercise, the FITT principles of exercise, normal and abnormal responses to exercise, and how to exercise safely.   Angina -Discuss definition of angina, causes of angina, treatment of angina, and how to decrease risk of having angina.   Cardiac Medications -Review what the following cardiac medications are used for, how they affect the body, and side effects that may occur when taking the  medications.  Medications include Aspirin , Beta blockers, calcium  channel blockers, ACE Inhibitors, angiotensin receptor blockers, diuretics, digoxin, and antihyperlipidemics.   Congestive Heart Failure -Discuss the definition of CHF, how to live with CHF, the signs and symptoms of CHF, and how keep track of weight and sodium intake.   Heart Disease and Intimacy -Discus the effect sexual activity has on the heart, how changes occur during intimacy as we age, and safety during sexual activity.   Smoking Cessation / COPD -Discuss different methods to quit smoking, the health benefits of quitting smoking, and the definition of COPD.   Nutrition I: Fats -Discuss the types of cholesterol, what cholesterol does to the heart, and how cholesterol levels can be controlled.   Nutrition II: Labels -Discuss the different components of food labels and how to read food label   Heart Parts/Heart Disease and PAD -Discuss the anatomy of the heart, the pathway of blood circulation through the heart, and these are affected by heart disease.   Stress I: Signs and Symptoms -Discuss the causes of stress, how stress may lead to anxiety and depression, and ways to limit stress.   Stress II: Relaxation -Discuss different types of relaxation techniques to limit stress.   Warning Signs of Stroke / TIA -Discuss definition of a stroke, what the signs and symptoms are of a stroke, and how to identify when someone is having stroke.   Knowledge Questionnaire Score:  Knowledge Questionnaire Score - 09/28/23 1100       Knowledge Questionnaire Score   Pre Score 18/26          Core Components/Risk Factors/Patient Goals at Admission:  Personal Goals and Risk Factors at Admission - 09/28/23 1022  Core Components/Risk Factors/Patient Goals on Admission    Weight Management Obesity;Weight Maintenance;Yes    Intervention Weight Management: Develop a combined nutrition and exercise program  designed to reach desired caloric intake, while maintaining appropriate intake of nutrient and fiber, sodium and fats, and appropriate energy expenditure required for the weight goal.;Weight Management: Provide education and appropriate resources to help participant work on and attain dietary goals.;Weight Management/Obesity: Establish reasonable short term and long term weight goals.;Obesity: Provide education and appropriate resources to help participant work on and attain dietary goals.    Admit Weight 214 lb 14.4 oz (97.5 kg)    Goal Weight: Short Term 209 lb (94.8 kg)    Goal Weight: Long Term 200 lb (90.7 kg)    Expected Outcomes Short Term: Continue to assess and modify interventions until short term weight is achieved;Long Term: Adherence to nutrition and physical activity/exercise program aimed toward attainment of established weight goal;Weight Loss: Understanding of general recommendations for a balanced deficit meal plan, which promotes 1-2 lb weight loss per week and includes a negative energy balance of [phone] kcal/d;Understanding recommendations for meals to include 15-35% energy as protein, 25-35% energy from fat, 35-60% energy from carbohydrates, less than 200mg  of dietary cholesterol, 20-35 gm of total fiber daily;Understanding of distribution of calorie intake throughout the day with the consumption of 4-5 meals/snacks    Hypertension Yes    Intervention Provide education on lifestyle modifcations including regular physical activity/exercise, weight management, moderate sodium restriction and increased consumption of fresh fruit, vegetables, and low fat dairy, alcohol moderation, and smoking cessation.;Monitor prescription use compliance.    Expected Outcomes Short Term: Continued assessment and intervention until BP is < 140/67mm HG in hypertensive participants. < 130/75mm HG in hypertensive participants with diabetes, heart failure or chronic kidney disease.;Long Term: Maintenance of  blood pressure at goal levels.    Lipids Yes    Intervention Provide education and support for participant on nutrition & aerobic/resistive exercise along with prescribed medications to achieve LDL 70mg , HDL >40mg .    Expected Outcomes Short Term: Participant states understanding of desired cholesterol values and is compliant with medications prescribed. Participant is following exercise prescription and nutrition guidelines.;Long Term: Cholesterol controlled with medications as prescribed, with individualized exercise RX and with personalized nutrition plan. Value goals: LDL < 70mg , HDL > 40 mg.          Core Components/Risk Factors/Patient Goals Review:    Core Components/Risk Factors/Patient Goals at Discharge (Final Review):    ITP Comments:  ITP Comments     Row Name 09/25/23 1403 09/28/23 1108 09/29/23 0803 10/11/23 1409     ITP Comments Virtual orientation visit completed for cardiac rehab with NSTEMI/ CABGx1. On-site orientation visit scheduled for 08/28/23 at 8:30. Patient arrived for 1st visit/orientation/education at 0830. Patient was referred to CR by Dr. Lonni End /Attending Dr. Mallipeddi due to NSTEMI/CABGx1. During orientation advised patient on arrival and appointment times what to wear, what to do before, during and after exercise. Reviewed attendance and class policy.  Pt is scheduled to return Cardiac Rehab on 09/29/23 at 747. Pt was advised to come to class 15 minutes before class starts.  Discussed RPE/Dpysnea scales. Patient participated in warm up stretches. Patient was able to complete 6 minute walk test.  Telemetry:NSR. Patient was measured for the equipment. Discussed equipment safety with patient. Took patient pre-anthropometric measurements. Patient finished visit at 1015. First full day of exercise!  Patient was oriented to gym and equipment including functions, settings, policies, and procedures.  Patient's individual exercise prescription and treatment plan  were reviewed.  All starting workloads were established based on the results of the 6 minute walk test done at initial orientation visit.  The plan for exercise progression was also introduced and progression will be customized based on patient's performance and goals. 30 day review completed. ITP sent to Dr. Dorn Ross, Medical Director of Cardiac Rehab. Continue with ITP unless changes are made by physician.  Still new to program.       Comments: 30 day review

## 2023-10-12 ENCOUNTER — Encounter (HOSPITAL_COMMUNITY)
Admission: RE | Admit: 2023-10-12 | Discharge: 2023-10-12 | Disposition: A | Discharge status: Home / Self Care | Source: Ambulatory Visit | Attending: Internal Medicine | Admitting: Internal Medicine

## 2023-10-12 DIAGNOSIS — I214 Non-ST elevation (NSTEMI) myocardial infarction: Secondary | ICD-10-CM | POA: Diagnosis not present

## 2023-10-12 DIAGNOSIS — Z951 Presence of aortocoronary bypass graft: Secondary | ICD-10-CM

## 2023-10-12 NOTE — Progress Notes (Signed)
 Daily Session Note  Patient Details  Name: Luis Hardin MRN: [id number] Date of Birth: 04/15/1965 Referring Provider:   Flowsheet Row CARDIAC REHAB PHASE II ORIENTATION from 09/28/2023 in [facility] CARDIAC REHABILITATION  Referring Provider End, Lonni MD  [facility] Cardiologist: Dr. Diannah Proffer Mallipeddi]    Encounter Date: 10/12/2023  Check In:  Session Check In - 10/12/23 1036       Check-In   Supervising physician immediately available to respond to emergencies See telemetry face sheet for immediately available MD    Location [facility]    Staff Present Powell Benders, BS, Exercise Physiologist;Jessica Vonzell, MA, RCEP, CCRP, CCET;Victoria [place], RN    Virtual Visit No    Medication changes reported     No    Fall or balance concerns reported    No    Tobacco Cessation No Change    Warm-up and Cool-down Performed on first and last piece of equipment    Resistance Training Performed Yes    VAD Patient? No    PAD/SET Patient? No      Pain Assessment   Currently in Pain? No/denies    Multiple Pain Sites No          Capillary Blood Glucose: No results found for this or any previous visit (from the past 24 hours).    Social History   Tobacco Use  Smoking Status Former   Current packs/day: 0.00   Average packs/day: 0.3 packs/day for 5.0 years (1.3 ttl pk-yrs)   Types: Cigarettes   Start date: 02/01/1983   Quit date: 02/01/1988   Years since quitting: 35.7  Smokeless Tobacco Never    Goals Met:  Independence with exercise equipment Exercise tolerated well No report of concerns or symptoms today Strength training completed today  Goals Unmet:  Not Applicable  Comments: Pt able to follow exercise prescription today without complaint.  Will continue to monitor for progression.

## 2023-10-13 ENCOUNTER — Encounter (HOSPITAL_COMMUNITY)
Admission: RE | Admit: 2023-10-13 | Discharge: 2023-10-13 | Disposition: A | Discharge status: Home / Self Care | Source: Ambulatory Visit | Attending: Internal Medicine | Admitting: Internal Medicine

## 2023-10-13 DIAGNOSIS — Z951 Presence of aortocoronary bypass graft: Secondary | ICD-10-CM

## 2023-10-13 DIAGNOSIS — I214 Non-ST elevation (NSTEMI) myocardial infarction: Secondary | ICD-10-CM

## 2023-10-13 NOTE — Progress Notes (Signed)
 Daily Session Note  Patient Details  Name: Luis Hardin MRN: [id number] Date of Birth: January 19, 1966 Referring Provider:   Flowsheet Row CARDIAC REHAB PHASE II ORIENTATION from 09/28/2023 in [facility] CARDIAC REHABILITATION  Referring Provider End, Lonni MD  [facility] Cardiologist: Dr. Diannah Proffer Mallipeddi]    Encounter Date: 10/13/2023  Check In:  Session Check In - 10/13/23 0756       Check-In   Supervising physician immediately available to respond to emergencies See telemetry face sheet for immediately available MD    Location [facility]    Staff Present Powell Benders, BS, Exercise Physiologist;Jessica Vonzell, MA, RCEP, CCRP, CCET    Virtual Visit No    Medication changes reported     No    Fall or balance concerns reported    No    Tobacco Cessation No Change    Warm-up and Cool-down Performed on first and last piece of equipment    Resistance Training Performed Yes    VAD Patient? No    PAD/SET Patient? No      Pain Assessment   Currently in Pain? No/denies    Multiple Pain Sites No          Capillary Blood Glucose: No results found for this or any previous visit (from the past 24 hours).    Social History   Tobacco Use  Smoking Status Former   Current packs/day: 0.00   Average packs/day: 0.3 packs/day for 5.0 years (1.3 ttl pk-yrs)   Types: Cigarettes   Start date: 02/01/1983   Quit date: 02/01/1988   Years since quitting: 35.7  Smokeless Tobacco Never    Goals Met:  Independence with exercise equipment Exercise tolerated well No report of concerns or symptoms today Strength training completed today  Goals Unmet:  Not Applicable  Comments: Pt able to follow exercise prescription today without complaint.  Will continue to monitor for progression.

## 2023-10-16 ENCOUNTER — Encounter (HOSPITAL_COMMUNITY)
Admission: RE | Admit: 2023-10-16 | Discharge: 2023-10-16 | Disposition: A | Discharge status: Home / Self Care | Source: Ambulatory Visit | Attending: Internal Medicine

## 2023-10-16 DIAGNOSIS — I214 Non-ST elevation (NSTEMI) myocardial infarction: Secondary | ICD-10-CM | POA: Diagnosis not present

## 2023-10-16 DIAGNOSIS — Z951 Presence of aortocoronary bypass graft: Secondary | ICD-10-CM

## 2023-10-16 NOTE — Progress Notes (Signed)
 Daily Session Note  Patient Details  Name: Luis Hardin MRN: [id number] Date of Birth: 04-06-1965 Referring Provider:   Flowsheet Row CARDIAC REHAB PHASE II ORIENTATION from 09/28/2023 in [facility] CARDIAC REHABILITATION  Referring Provider End, Lonni MD  [facility] Cardiologist: Dr. Diannah Proffer Mallipeddi]    Encounter Date: 10/16/2023  Check In:  Session Check In - 10/16/23 0752       Check-In   Supervising physician immediately available to respond to emergencies See telemetry face sheet for immediately available MD    Location [facility]    Staff Present Powell Benders, BS, Exercise Physiologist;Jessica Vonzell, MA, RCEP, CCRP, CCET;Brittany Jackquline, BSN, RN, WTA-C    Virtual Visit No    Medication changes reported     No    Tobacco Cessation No Change    Warm-up and Cool-down Performed on first and last piece of equipment    Resistance Training Performed Yes    VAD Patient? No    PAD/SET Patient? No      Pain Assessment   Currently in Pain? No/denies    Multiple Pain Sites No          Capillary Blood Glucose: No results found for this or any previous visit (from the past 24 hours).    Social History   Tobacco Use  Smoking Status Former   Current packs/day: 0.00   Average packs/day: 0.3 packs/day for 5.0 years (1.3 ttl pk-yrs)   Types: Cigarettes   Start date: 02/01/1983   Quit date: 02/01/1988   Years since quitting: 35.7  Smokeless Tobacco Never    Goals Met:  Independence with exercise equipment Exercise tolerated well No report of concerns or symptoms today Strength training completed today  Goals Unmet:  Not Applicable  Comments: Pt able to follow exercise prescription today without complaint.  Will continue to monitor for progression.

## 2023-10-17 ENCOUNTER — Encounter (HOSPITAL_COMMUNITY)
Admission: RE | Admit: 2023-10-17 | Discharge: 2023-10-17 | Disposition: A | Discharge status: Home / Self Care | Source: Ambulatory Visit | Attending: Internal Medicine

## 2023-10-17 DIAGNOSIS — Z951 Presence of aortocoronary bypass graft: Secondary | ICD-10-CM

## 2023-10-17 DIAGNOSIS — I214 Non-ST elevation (NSTEMI) myocardial infarction: Secondary | ICD-10-CM

## 2023-10-17 NOTE — Progress Notes (Signed)
 Daily Session Note  Patient Details  Name: Girard Koontz MRN: [id number] Date of Birth: 06-13-1965 Referring Provider:   Flowsheet Row CARDIAC REHAB PHASE II ORIENTATION from 09/28/2023 in [facility] CARDIAC REHABILITATION  Referring Provider End, Lonni MD  [facility] Cardiologist: Dr. Diannah Proffer Mallipeddi]    Encounter Date: 10/17/2023  Check In:  Session Check In - 10/17/23 1010       Check-In   Supervising physician immediately available to respond to emergencies See telemetry face sheet for immediately available MD    Location [facility]    Staff Present Laymon Rattler, BSN, RN, WTA-C;Mary Idell Glen, RN, BSN, MA;Heather Con, BS, Exercise Physiologist    Virtual Visit No    Medication changes reported     No    Fall or balance concerns reported    No    Tobacco Cessation No Change    Warm-up and Cool-down Performed on first and last piece of equipment    Resistance Training Performed Yes    VAD Patient? No    PAD/SET Patient? No      Pain Assessment   Currently in Pain? No/denies          Capillary Blood Glucose: No results found for this or any previous visit (from the past 24 hours).    Social History   Tobacco Use  Smoking Status Former   Current packs/day: 0.00   Average packs/day: 0.3 packs/day for 5.0 years (1.3 ttl pk-yrs)   Types: Cigarettes   Start date: 02/01/1983   Quit date: 02/01/1988   Years since quitting: 35.7  Smokeless Tobacco Never    Goals Met:  Independence with exercise equipment Exercise tolerated well No report of concerns or symptoms today Strength training completed today  Goals Unmet:  Not Applicable  Comments: Pt able to follow exercise prescription today without complaint.  Will continue to monitor for progression.

## 2023-10-18 ENCOUNTER — Encounter (HOSPITAL_COMMUNITY)
Admission: RE | Admit: 2023-10-18 | Discharge: 2023-10-18 | Disposition: A | Discharge status: Home / Self Care | Source: Ambulatory Visit | Attending: Internal Medicine | Admitting: Internal Medicine

## 2023-10-18 DIAGNOSIS — I214 Non-ST elevation (NSTEMI) myocardial infarction: Secondary | ICD-10-CM

## 2023-10-18 DIAGNOSIS — Z951 Presence of aortocoronary bypass graft: Secondary | ICD-10-CM

## 2023-10-18 NOTE — Progress Notes (Signed)
 Daily Session Note  Patient Details  Name: Blaire Hodsdon MRN: [id number] Date of Birth: 1965-04-06 Referring Provider:   Flowsheet Row CARDIAC REHAB PHASE II ORIENTATION from 09/28/2023 in [facility] CARDIAC REHABILITATION  Referring Provider End, Lonni MD  [facility] Cardiologist: Dr. Diannah Proffer Mallipeddi]    Encounter Date: 10/18/2023  Check In:  Session Check In - 10/18/23 0805       Check-In   Supervising physician immediately available to respond to emergencies See telemetry face sheet for immediately available MD    Location [facility]    Staff Present Powell Benders, BS, Exercise Physiologist;Brittany Jackquline, BSN, RN, WTA-C    Virtual Visit No    Medication changes reported     No    Fall or balance concerns reported    No    Warm-up and Cool-down Performed on first and last piece of equipment    Resistance Training Performed Yes    VAD Patient? No    PAD/SET Patient? No      Pain Assessment   Currently in Pain? No/denies          Capillary Blood Glucose: No results found for this or any previous visit (from the past 24 hours).    Social History   Tobacco Use  Smoking Status Former   Current packs/day: 0.00   Average packs/day: 0.3 packs/day for 5.0 years (1.3 ttl pk-yrs)   Types: Cigarettes   Start date: 02/01/1983   Quit date: 02/01/1988   Years since quitting: 35.7  Smokeless Tobacco Never    Goals Met:  Independence with exercise equipment Exercise tolerated well No report of concerns or symptoms today Strength training completed today  Goals Unmet:  Not Applicable  Comments: Pt able to follow exercise prescription today without complaint.  Will continue to monitor for progression.

## 2023-10-20 ENCOUNTER — Encounter (HOSPITAL_COMMUNITY)
Admission: RE | Admit: 2023-10-20 | Discharge: 2023-10-20 | Disposition: A | Discharge status: Home / Self Care | Source: Ambulatory Visit | Attending: Internal Medicine | Admitting: Internal Medicine

## 2023-10-20 DIAGNOSIS — I214 Non-ST elevation (NSTEMI) myocardial infarction: Secondary | ICD-10-CM

## 2023-10-20 DIAGNOSIS — Z951 Presence of aortocoronary bypass graft: Secondary | ICD-10-CM

## 2023-10-20 NOTE — Progress Notes (Signed)
 Daily Session Note  Patient Details  Name: Luis Hardin MRN: [id number] Date of Birth: December 26, 1965 Referring Provider:   Flowsheet Row CARDIAC REHAB PHASE II ORIENTATION from 09/28/2023 in [facility] CARDIAC REHABILITATION  Referring Provider End, Lonni MD  [facility] Cardiologist: Dr. Diannah Proffer Mallipeddi]    Encounter Date: 10/20/2023  Check In:  Session Check In - 10/20/23 0756       Check-In   Supervising physician immediately available to respond to emergencies See telemetry face sheet for immediately available MD    Location [facility]    Staff Present Richerd Buddle, RN;Heather Con, [place], Exercise Physiologist    Virtual Visit No    Medication changes reported     No    Fall or balance concerns reported    No    Tobacco Cessation No Change    Warm-up and Cool-down Performed on first and last piece of equipment    Resistance Training Performed Yes    VAD Patient? No    PAD/SET Patient? No      Pain Assessment   Currently in Pain? No/denies          Capillary Blood Glucose: No results found for this or any previous visit (from the past 24 hours).    Social History   Tobacco Use  Smoking Status Former   Current packs/day: 0.00   Average packs/day: 0.3 packs/day for 5.0 years (1.3 ttl pk-yrs)   Types: Cigarettes   Start date: 02/01/1983   Quit date: 02/01/1988   Years since quitting: 35.7  Smokeless Tobacco Never    Goals Met:  Independence with exercise equipment Exercise tolerated well No report of concerns or symptoms today Strength training completed today  Goals Unmet:  Not Applicable  Comments: Pt able to follow exercise prescription today without complaint.  Will continue to monitor for progression.

## 2023-10-23 ENCOUNTER — Encounter (HOSPITAL_COMMUNITY)

## 2023-10-24 ENCOUNTER — Encounter (HOSPITAL_COMMUNITY)

## 2023-10-25 ENCOUNTER — Encounter (HOSPITAL_COMMUNITY)

## 2023-10-25 NOTE — Progress Notes (Unsigned)
 Cardiology Office Note    Date:  10/26/2023  ID:  Luis Hardin, DOB 12-05-1965, MRN [id number] Cardiologist: Diannah SHAUNNA Maywood, MD Cardiology APP:  Johnson Laymon HERO, PA-C { :  History of Present Illness:    Luis Hardin is a 58 y.o. 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  He was admitted to [facility] in 08/2023 for evaluation of chest pain and Hs Troponin values peaked at 160. He ultimately underwent cardiac catheterization by Dr. 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 Was recommended to consider a functional study to assess for exercise-induced LAD territory infarct or CT Surgery consultation.  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 By review of the discharge summary, he progressed well and did maintain normal sinus rhythm. Was discharged on Lasix 20 mg daily for 7 days, Lopressor  25 mg twice daily, ASA 81 mg daily and Atorvastatin  20 mg daily.  He did have a follow-up visit with CT Surgery on 09/18/2023 and was overall progressing well and was cleared to start cardiac rehab.  In talking with the patient and his wife today, he reports overall doing very well since his recent surgery. He has been walking for 25 to 30 minutes a day at home and denies any exertional chest pain with this. Still having some incisional pain but overall improved. He has been participating in cardiac rehab but is planning to quit soon  given that he is overall progressing well and he reports the exercises have been bothering his shoulder as he previously had surgery on this. Breathing has been stable with no specific dyspnea on exertion, orthopnea, PND or pitting edema. He has been experiencing a cough and sinus congestion over the past few days. Reviewed he could take plain Mucinex and should use a humidifier and follow-up with his PCP if no improvement.  Studies Reviewed:   EKG: EKG is not ordered today.  Echocardiogram: 08/2023 IMPRESSIONS     1. Left ventricular ejection fraction, by estimation, is 60 to 65%. The  left ventricle has normal function. The left ventricle has no regional  wall motion abnormalities. There is mild left ventricular hypertrophy.  Left ventricular diastolic parameters  were normal.   2. Right ventricular systolic function is normal. The right ventricular  size is normal. Tricuspid regurgitation signal is inadequate for assessing  PA pressure.   3. The mitral valve is grossly normal. Trivial mitral valve  regurgitation. No evidence of mitral stenosis.   4. The aortic valve is grossly normal. There is mild thickening of the  aortic valve. Aortic valve regurgitation is trivial. No aortic stenosis is  present.   5. The inferior vena cava is normal in size with greater than 50%  respiratory variability, suggesting right atrial pressure of 3 mmHg.   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  No significant disease involving LCx or  RCA. Normal left ventricular systolic function (LVEF 55-65%) and filling pressure (LVEDP 10 mmHg).   Recommendations: Continue medical therapy, including aggressive beta-blockage. Consider functional study to assess for exercise-induced LAD-territory ischemia and/or cardiac surgery consultation.  TEE: 08/29/2023 (At [facility])        INTERPRETATION    Pre-TEE:    - Normal biventricular function    - Trace AI, trace MR, trace TR, trace PI    - No PFO    - No acute aortopathy       Post-TEE:    - Normal biventricular function    - Trace AI, trace MR, trace TR, trace PI    - No PFO    - No acute aortopathy   Physical Exam:   VS:  BP 126/84   Pulse 85   Ht 5' 9 (1.753 m)   Wt 216 lb 6.4 oz (98.2 kg)   SpO2 98%   BMI 31.96 kg/m    Wt Readings from Last 3 Encounters:  10/26/23 216 lb 6.4 oz (98.2 kg)  09/28/23 214 lb 14.4 oz (97.5 kg)  08/20/23 210 lb (95.3 kg)     GEN: Well nourished, well developed male appearing in no acute distress NECK: No JVD; No carotid bruits CARDIAC: RRR, no murmurs, rubs, gallops RESPIRATORY:  Clear to auscultation without rales, wheezing or rhonchi. Sternal incision appears to be healing well with no erythema or drainage. Small area at the top which feels like a small stitch (encouraged him to follow-up with CT Surgery if this does not resolve) ABDOMEN: Appears non-distended. No obvious abdominal masses. EXTREMITIES: No clubbing or cyanosis. No pitting edema.  Distal pedal pulses are 2+ bilaterally.   Assessment and Plan:   1. 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 Prevoiously cleared by CT Surgery to return to work on 11/20/2023 and updated paperwork completed today with this information. Should not return prior to that given that he is still recovering from his sternotomy.   - Will continue current medical  therapy with ASA 81 mg daily, Atorvastatin  20 mg daily and Lopressor  25 mg twice daily.    2. Hyperlipidemia LDL goal <70 - FLP in 08/2023 showed total cholesterol 91, triglycerides 81, HDL 35 and LDL 40. Continue current medical therapy with Atorvastatin  20 mg daily.  3. HTN - Blood pressure is at 126/84 during today's visit and has overall been well-controlled when checked at cardiac rehab. Continue current medical therapy for now with Lopressor  25 mg twice daily.  Signed, Laymon CHRISTELLA Qua, PA-C

## 2023-10-26 ENCOUNTER — Ambulatory Visit: Attending: Student | Admitting: Student

## 2023-10-26 ENCOUNTER — Encounter (HOSPITAL_COMMUNITY)

## 2023-10-26 ENCOUNTER — Encounter: Payer: Self-pay | Admitting: Student

## 2023-10-26 ENCOUNTER — Encounter (HOSPITAL_COMMUNITY): Payer: Self-pay | Admitting: *Deleted

## 2023-10-26 VITALS — BP 126/84 | HR 85 | Ht 69.0 in | Wt 216.4 lb

## 2023-10-26 DIAGNOSIS — E785 Hyperlipidemia, unspecified: Secondary | ICD-10-CM

## 2023-10-26 DIAGNOSIS — Q245 Malformation of coronary vessels: Secondary | ICD-10-CM | POA: Insufficient documentation

## 2023-10-26 DIAGNOSIS — I10 Essential (primary) hypertension: Secondary | ICD-10-CM

## 2023-10-26 DIAGNOSIS — I2510 Atherosclerotic heart disease of native coronary artery without angina pectoris: Secondary | ICD-10-CM

## 2023-10-26 DIAGNOSIS — Z951 Presence of aortocoronary bypass graft: Secondary | ICD-10-CM

## 2023-10-26 DIAGNOSIS — I214 Non-ST elevation (NSTEMI) myocardial infarction: Secondary | ICD-10-CM

## 2023-10-26 MED ORDER — ATORVASTATIN CALCIUM 20 MG PO TABS: 20.0000 mg | ORAL_TABLET | Freq: Every day | ORAL | 3 refills | Status: AC

## 2023-10-26 MED ORDER — METOPROLOL TARTRATE 25 MG PO TABS: 25.0000 mg | ORAL_TABLET | Freq: Two times a day (BID) | ORAL | 3 refills | Status: AC

## 2023-10-26 MED ORDER — ASPIRIN 81 MG PO TBEC: 81.0000 mg | DELAYED_RELEASE_TABLET | Freq: Every day | ORAL | 3 refills | Status: AC

## 2023-10-26 NOTE — Progress Notes (Signed)
 Cardiac Individual Treatment Plan  Patient Details  Name: Luis Hardin MRN: [id number] Date of Birth: August 23, 1965 Referring Provider:   Flowsheet Row CARDIAC REHAB PHASE II ORIENTATION from 09/28/2023 in [facility] CARDIAC REHABILITATION  Referring Provider End, Lonni MD  [facility] Cardiologist: Dr. Diannah Proffer Mallipeddi]    Initial Encounter Date:  Flowsheet Row CARDIAC REHAB PHASE II ORIENTATION from 09/28/2023 in [place] CARDIAC REHABILITATION  Date 09/28/23    Visit Diagnosis: NSTEMI (non-ST elevated myocardial infarction) (HCC)  S/P CABG x 1  Patient's Home Medications on Admission:  Current Outpatient Medications:    acetaminophen  (TYLENOL ) 500 MG tablet, Take 1,000 mg by mouth every 6 (six) hours as needed for moderate pain., Disp: , Rfl:    aspirin  EC 81 MG tablet, Take 1 tablet (81 mg total) by mouth daily. Swallow whole., Disp: 90 tablet, Rfl: 3   atorvastatin  (LIPITOR) 20 MG tablet, Take 1 tablet (20 mg total) by mouth daily., Disp: 90 tablet, Rfl: 3   metoprolol  tartrate (LOPRESSOR ) 25 MG tablet, Take 1 tablet (25 mg total) by mouth 2 (two) times daily., Disp: 180 tablet, Rfl: 3   Multiple Vitamin (MULTIVITAMIN) tablet, Take 1 tablet by mouth daily. (Patient not taking: Reported on 10/26/2023), Disp: , Rfl:    omeprazole (PRILOSEC) 20 MG capsule, Take 20 mg by mouth daily., Disp: , Rfl:    triamcinolone cream (KENALOG) 0.1 %, Apply 1 Application topically 2 (two) times daily as needed (eczema)., Disp: , Rfl:   Past Medical History: Past Medical History:  Diagnosis Date   Cancer (HCC) 2023   prostate cancer   GERD (gastroesophageal reflux disease)    H/O hiatal hernia    Hyperlipidemia    Hypertension    Inguinal hernia    RIGHT   Tendonitis    KNEES    Tobacco Use: Social History   Tobacco Use  Smoking Status Former   Current packs/day: 0.00   Average packs/day: 0.3 packs/day for 5.0 years (1.3 ttl pk-yrs)   Types: Cigarettes   Start date:  02/01/1983   Quit date: 02/01/1988   Years since quitting: 35.7  Smokeless Tobacco Never    Labs: Review Flowsheet       Latest Ref Rng & Units 05/17/2023 08/13/2023  Labs for ITP Cardiac and Pulmonary Rehab  Cholestrol 0 - 200 mg/dL 848  91   LDL (calc) 0 - 99 mg/dL 90  40   HDL-C >59 mg/dL 30  35   Trlycerides <849 mg/dL 845  81     Capillary Blood Glucose: No results found for: GLUCAP   Exercise Target Goals: Exercise Program Goal: Individual exercise prescription set using results from initial 6 min walk test and THRR while considering  patient's activity barriers and safety.   Exercise Prescription Goal: Starting with aerobic activity 30 plus minutes a day, 3 days per week for initial exercise prescription. Provide home exercise prescription and guidelines that participant acknowledges understanding prior to discharge.  Activity Barriers & Risk Stratification:  Activity Barriers & Cardiac Risk Stratification - 09/25/23 1332       Activity Barriers & Cardiac Risk Stratification   Activity Barriers None    Cardiac Risk Stratification Moderate          6 Minute Walk:  6 Minute Walk     Row Name 09/28/23 1018         6 Minute Walk   Phase Initial     Distance 1350 feet     Walk Time 6  minutes     # of Rest Breaks 0     MPH 2.56     METS 3.41     RPE 7     VO2 Peak 11.93     Symptoms No     Resting HR 80 bpm     Resting BP 100/70     Resting Oxygen Saturation  99 %     Exercise Oxygen Saturation  during 6 min walk 100 %     Max Ex. HR 110 bpm     Max Ex. BP 126/74     2 Minute Post BP 122/74        Oxygen Initial Assessment:   Oxygen Re-Evaluation:   Oxygen Discharge (Final Oxygen Re-Evaluation):   Initial Exercise Prescription:  Initial Exercise Prescription - 09/28/23 1000       Date of Initial Exercise RX and Referring Provider   Date 09/28/23    Referring Provider End, Christopher MD   Primary Cardiologist: Dr. Diannah Proffer Mallipeddi      Oxygen   Maintain Oxygen Saturation 88% or higher      Treadmill   MPH 2.5    Grade 1    Minutes 15    METs 3.26      Bike   Level 2    Minutes 15    METs 3.5      REL-XR   Level 3    Speed 50    Minutes 15    METs 3.5      Prescription Details   Frequency (times per week) 4    Duration Progress to 30 minutes of continuous aerobic without signs/symptoms of physical distress      Intensity   THRR 40-80% of Max Heartrate 113-146    Ratings of Perceived Exertion 11-13    Perceived Dyspnea 0-4      Progression   Progression Continue to progress workloads to maintain intensity without signs/symptoms of physical distress.      Resistance Training   Training Prescription Yes    Weight 4 lb    Reps 10-15          Perform Capillary Blood Glucose checks as needed.  Exercise Prescription Changes:   Exercise Prescription Changes     Row Name 09/28/23 1000 10/16/23 1000           Response to Exercise   Blood Pressure (Admit) 100/70 108/80      Blood Pressure (Exercise) 126/74 --      Blood Pressure (Exit) 122/74 102/70      Heart Rate (Admit) 80 bpm 70 bpm      Heart Rate (Exercise) 110 bpm 123 bpm      Heart Rate (Exit) 83 bpm 100 bpm      Oxygen Saturation (Admit) 99 % --      Oxygen Saturation (Exercise) 100 % --      Rating of Perceived Exertion (Exercise) 7 12      Symptoms none --      Comments walk test results --      Duration -- Continue with 30 min of aerobic exercise without signs/symptoms of physical distress.      Intensity -- THRR unchanged        Resistance Training   Training Prescription -- Yes      Weight -- 4      Reps -- 10-15        Treadmill   MPH -- 3      Grade --  2.5      Minutes -- 15      METs -- 4.33        Bike   Level -- 6  93 rpm      Minutes -- 15      METs -- 3.2         Exercise Comments:   Exercise Comments     Row Name 09/29/23 0804           Exercise Comments First full day of exercise!   Patient was oriented to gym and equipment including functions, settings, policies, and procedures.  Patient's individual exercise prescription and treatment plan were reviewed.  All starting workloads were established based on the results of the 6 minute walk test done at initial orientation visit.  The plan for exercise progression was also introduced and progression will be customized based on patient's performance and goals.          Exercise Goals and Review:   Exercise Goals     Row Name 09/28/23 1021             Exercise Goals   Increase Physical Activity Yes       Intervention Provide advice, education, support and counseling about physical activity/exercise needs.;Develop an individualized exercise prescription for aerobic and resistive training based on initial evaluation findings, risk stratification, comorbidities and participant's personal goals.       Expected Outcomes Short Term: Attend rehab on a regular basis to increase amount of physical activity.;Long Term: Exercising regularly at least 3-5 days a week.;Long Term: Add in home exercise to make exercise part of routine and to increase amount of physical activity.       Increase Strength and Stamina Yes       Intervention Provide advice, education, support and counseling about physical activity/exercise needs.;Develop an individualized exercise prescription for aerobic and resistive training based on initial evaluation findings, risk stratification, comorbidities and participant's personal goals.       Expected Outcomes Short Term: Increase workloads from initial exercise prescription for resistance, speed, and METs.;Short Term: Perform resistance training exercises routinely during rehab and add in resistance training at home;Long Term: Improve cardiorespiratory fitness, muscular endurance and strength as measured by increased METs and functional capacity ( )       Able to understand and use rate of perceived exertion (RPE)  scale Yes       Intervention Provide education and explanation on how to use RPE scale       Expected Outcomes Short Term: Able to use RPE daily in rehab to express subjective intensity level;Long Term:  Able to use RPE to guide intensity level when exercising independently       Able to understand and use Dyspnea scale Yes       Intervention Provide education and explanation on how to use Dyspnea scale       Expected Outcomes Short Term: Able to use Dyspnea scale daily in rehab to express subjective sense of shortness of breath during exertion;Long Term: Able to use Dyspnea scale to guide intensity level when exercising independently       Knowledge and understanding of Target Heart Rate Range (THRR) Yes       Intervention Provide education and explanation of THRR including how the numbers were predicted and where they are located for reference       Expected Outcomes Short Term: Able to state/look up THRR;Long Term: Able to use THRR to govern intensity when exercising independently;Short Term:  Able to use daily as guideline for intensity in rehab       Able to check pulse independently Yes       Intervention Provide education and demonstration on how to check pulse in carotid and radial arteries.;Review the importance of being able to check your own pulse for safety during independent exercise       Expected Outcomes Short Term: Able to explain why pulse checking is important during independent exercise;Long Term: Able to check pulse independently and accurately       Understanding of Exercise Prescription Yes       Intervention Provide education, explanation, and written materials on patient's individual exercise prescription       Expected Outcomes Short Term: Able to explain program exercise prescription;Long Term: Able to explain home exercise prescription to exercise independently          Exercise Goals Re-Evaluation :  Exercise Goals Re-Evaluation     Row Name 09/29/23 0804 10/16/23  1040           Exercise Goal Re-Evaluation   Exercise Goals Review Able to understand and use rate of perceived exertion (RPE) scale;Able to understand and use Dyspnea scale;Knowledge and understanding of Target Heart Rate Range (THRR);Understanding of Exercise Prescription Increase Physical Activity;Increase Strength and Stamina;Understanding of Exercise Prescription      Comments Reviewed RPE and dyspnea scale, THR and program prescription with pt today.  Pt voiced understanding and was given a copy of goals to take home. Bradley is doing well in rehab. He is pushing himself and doing well. He has completed 11 sessions of rehab. Will continue to monitor and progress as able.      Expected Outcomes Short: Use RPE daily to regulate intensity.  Long: Follow program prescription in THR. Short: continue to attned rehab long: contineu to exercise          Discharge Exercise Prescription (Final Exercise Prescription Changes):  Exercise Prescription Changes - 10/16/23 1000       Response to Exercise   Blood Pressure (Admit) 108/80    Blood Pressure (Exit) 102/70    Heart Rate (Admit) 70 bpm    Heart Rate (Exercise) 123 bpm    Heart Rate (Exit) 100 bpm    Rating of Perceived Exertion (Exercise) 12    Duration Continue with 30 min of aerobic exercise without signs/symptoms of physical distress.    Intensity THRR unchanged      Resistance Training   Training Prescription Yes    Weight 4    Reps 10-15      Treadmill   MPH 3    Grade 2.5    Minutes 15    METs 4.33      Bike   Level 6   93 rpm   Minutes 15    METs 3.2          Nutrition:  Target Goals: Understanding of nutrition guidelines, daily intake of sodium 1500mg , cholesterol 200mg , calories 30% from fat and 7% or less from saturated fats, daily to have 5 or more servings of fruits and vegetables.  Biometrics:  Pre Biometrics - 09/28/23 1021       Pre Biometrics   Height 5' 8 (1.727 m)    Weight 214 lb 14.4 oz  (97.5 kg)    Waist Circumference 43 inches    Hip Circumference 40.25 inches    Waist to Hip Ratio 1.07 %    BMI (Calculated) 32.68    Grip Strength 47.8  kg    Single Leg Stand 30 seconds           Nutrition Therapy Plan and Nutrition Goals:   Nutrition Assessments:  MEDIFICTS Score Key: >=70 Need to make dietary changes  40-70 Heart Healthy Diet <= 40 Therapeutic Level Cholesterol Diet  Flowsheet Row CARDIAC REHAB PHASE II ORIENTATION from 09/28/2023 in [facility] CARDIAC REHABILITATION  Picture Your Plate Total Score on Admission 41   Picture Your Plate Scores: <59 Unhealthy dietary pattern with much room for improvement. 41-50 Dietary pattern unlikely to meet recommendations for good health and room for improvement. 51-60 More healthful dietary pattern, with some room for improvement.  >60 Healthy dietary pattern, although there may be some specific behaviors that could be improved.    Nutrition Goals Re-Evaluation:   Nutrition Goals Discharge (Final Nutrition Goals Re-Evaluation):   Psychosocial: Target Goals: Acknowledge presence or absence of significant depression and/or stress, maximize coping skills, provide positive support system. Participant is able to verbalize types and ability to use techniques and skills needed for reducing stress and depression.  Initial Review & Psychosocial Screening:  Initial Psych Review & Screening - 09/25/23 1354       Initial Review   Current issues with None Identified      Family Dynamics   Good Support System? Yes    Comments Patient's wife and daughter are his main support and some friends.      Barriers   Psychosocial barriers to participate in program The patient should benefit from training in stress management and relaxation.;There are no identifiable barriers or psychosocial needs.      Screening Interventions   Interventions To provide support and resources with identified psychosocial needs;Encouraged to  exercise;Provide feedback about the scores to participant    Expected Outcomes Short Term goal: Utilizing psychosocial counselor, staff and physician to assist with identification of specific Stressors or current issues interfering with healing process. Setting desired goal for each stressor or current issue identified.;Long Term Goal: Stressors or current issues are controlled or eliminated.;Short Term goal: Identification and review with participant of any Quality of Life or Depression concerns found by scoring the questionnaire.;Long Term goal: The participant improves quality of Life and PHQ9 Scores as seen by post scores and/or verbalization of changes          Quality of Life Scores:  Quality of Life - 09/28/23 1115       Quality of Life   Select Quality of Life      Quality of Life Scores   Health/Function Pre 29.57 %    Socioeconomic Pre 30 %    Psych/Spiritual Pre 30 %    Family Pre 30 %    GLOBAL Pre 29.82 %         Scores of 19 and below usually indicate a poorer quality of life in these areas.  A difference of  2-3 points is a clinically meaningful difference.  A difference of 2-3 points in the total score of the Quality of Life Index has been associated with significant improvement in overall quality of life, self-image, physical symptoms, and general health in studies assessing change in quality of life.  PHQ-9: Review Flowsheet       09/28/2023  Depression screen PHQ 2/9  Decreased Interest 0  Down, Depressed, Hopeless 0  PHQ - 2 Score 0  Altered sleeping 0  Tired, decreased energy 0  Change in appetite 0  Feeling bad or failure about yourself  0  Trouble  concentrating 0  Moving slowly or fidgety/restless 0  Suicidal thoughts 0  PHQ-9 Score 0   Interpretation of Total Score  Total Score Depression Severity:  1-4 = Minimal depression, 5-9 = Mild depression, 10-14 = Moderate depression, 15-19 = Moderately severe depression, 20-27 = Severe depression    Psychosocial Evaluation and Intervention:  Psychosocial Evaluation - 09/25/23 1355       Psychosocial Evaluation & Interventions   Interventions Stress management education;Relaxation education;Encouraged to exercise with the program and follow exercise prescription    Comments Patient was referred to cardiac rehab with NSTEMI 7/12 and he had [facility]Gx[facility] 7/[facility]. He has been doing well since his surgery. He denies any depression or anxiety. He sleeps well at night. He works for a company in [employer] and plans to return to work 10/20 and hopes to have finished most of his CR by this date. His goals for the program are to get back to work and doing all the activities he wants to do and to learn what he is able to do. He has no barriers identified to complete the program.    Expected Outcomes Short Term: Patient will start the program and attend consistently. Long Term: Patient will complete the program meeting personal goals.    Continue Psychosocial Services  Follow up required by staff          Psychosocial Re-Evaluation:   Psychosocial Discharge (Final Psychosocial Re-Evaluation):   Vocational Rehabilitation: Provide vocational rehab assistance to qualifying candidates.   Vocational Rehab Evaluation & Intervention:  Vocational Rehab - 09/25/23 1353       Initial Vocational Rehab Evaluation & Intervention   Assessment shows need for Vocational Rehabilitation No      Vocational Rehab Re-Evaulation   Comments Patient plans to return to his job.          Education: Education Goals: Education classes will be provided on a weekly basis, covering required topics. Participant will state understanding/return demonstration of topics presented.  Learning Barriers/Preferences:  Learning Barriers/Preferences - 09/25/23 1353       Learning Barriers/Preferences   Learning Barriers None    Learning Preferences Skilled Demonstration          Education Topics: Hypertension,  Hypertension Reduction -Define heart disease and high blood pressure. Discus how high blood pressure affects the body and ways to reduce high blood pressure.   Exercise and Your Heart -Discuss why it is important to exercise, the FITT principles of exercise, normal and abnormal responses to exercise, and how to exercise safely.   Angina -Discuss definition of angina, causes of angina, treatment of angina, and how to decrease risk of having angina.   Cardiac Medications -Review what the following cardiac medications are used for, how they affect the body, and side effects that may occur when taking the medications.  Medications include Aspirin , Beta blockers, calcium  channel blockers, ACE Inhibitors, angiotensin receptor blockers, diuretics, digoxin, and antihyperlipidemics.   Congestive Heart Failure -Discuss the definition of CHF, how to live with CHF, the signs and symptoms of CHF, and how keep track of weight and sodium intake.   Heart Disease and Intimacy -Discus the effect sexual activity has on the heart, how changes occur during intimacy as we age, and safety during sexual activity.   Smoking Cessation / COPD -Discuss different methods to quit smoking, the health benefits of quitting smoking, and the definition of COPD.   Nutrition I: Fats -Discuss the types of cholesterol, what cholesterol does to the heart,  and how cholesterol levels can be controlled.   Nutrition II: Labels -Discuss the different components of food labels and how to read food label   Heart Parts/Heart Disease and PAD -Discuss the anatomy of the heart, the pathway of blood circulation through the heart, and these are affected by heart disease.   Stress I: Signs and Symptoms -Discuss the causes of stress, how stress may lead to anxiety and depression, and ways to limit stress.   Stress II: Relaxation -Discuss different types of relaxation techniques to limit stress.   Warning Signs of Stroke /  TIA -Discuss definition of a stroke, what the signs and symptoms are of a stroke, and how to identify when someone is having stroke.   Knowledge Questionnaire Score:  Knowledge Questionnaire Score - 09/28/23 1100       Knowledge Questionnaire Score   Pre Score 18/26          Core Components/Risk Factors/Patient Goals at Admission:  Personal Goals and Risk Factors at Admission - 09/28/23 1022       Core Components/Risk Factors/Patient Goals on Admission    Weight Management Obesity;Weight Maintenance;Yes    Intervention Weight Management: Develop a combined nutrition and exercise program designed to reach desired caloric intake, while maintaining appropriate intake of nutrient and fiber, sodium and fats, and appropriate energy expenditure required for the weight goal.;Weight Management: Provide education and appropriate resources to help participant work on and attain dietary goals.;Weight Management/Obesity: Establish reasonable short term and long term weight goals.;Obesity: Provide education and appropriate resources to help participant work on and attain dietary goals.    Admit Weight 214 lb 14.4 oz (97.5 kg)    Goal Weight: Short Term 209 lb (94.8 kg)    Goal Weight: Long Term 200 lb (90.7 kg)    Expected Outcomes Short Term: Continue to assess and modify interventions until short term weight is achieved;Long Term: Adherence to nutrition and physical activity/exercise program aimed toward attainment of established weight goal;Weight Loss: Understanding of general recommendations for a balanced deficit meal plan, which promotes 1-2 lb weight loss per week and includes a negative energy balance of [phone] kcal/d;Understanding recommendations for meals to include 15-35% energy as protein, 25-35% energy from fat, 35-60% energy from carbohydrates, less than 200mg  of dietary cholesterol, 20-35 gm of total fiber daily;Understanding of distribution of calorie intake throughout the day with the  consumption of 4-5 meals/snacks    Hypertension Yes    Intervention Provide education on lifestyle modifcations including regular physical activity/exercise, weight management, moderate sodium restriction and increased consumption of fresh fruit, vegetables, and low fat dairy, alcohol moderation, and smoking cessation.;Monitor prescription use compliance.    Expected Outcomes Short Term: Continued assessment and intervention until BP is < 140/12mm HG in hypertensive participants. < 130/32mm HG in hypertensive participants with diabetes, heart failure or chronic kidney disease.;Long Term: Maintenance of blood pressure at goal levels.    Lipids Yes    Intervention Provide education and support for participant on nutrition & aerobic/resistive exercise along with prescribed medications to achieve LDL 70mg , HDL >40mg .    Expected Outcomes Short Term: Participant states understanding of desired cholesterol values and is compliant with medications prescribed. Participant is following exercise prescription and nutrition guidelines.;Long Term: Cholesterol controlled with medications as prescribed, with individualized exercise RX and with personalized nutrition plan. Value goals: LDL < 70mg , HDL > 40 mg.          Core Components/Risk Factors/Patient Goals Review:    Core Components/Risk Factors/Patient Goals  at Discharge (Final Review):    ITP Comments:  ITP Comments     Row Name 09/25/23 1403 09/28/23 1108 09/29/23 0803 10/11/23 1409 10/26/23 0846   ITP Comments Virtual orientation visit completed for cardiac rehab with NSTEMI/ CABGx1. On-site orientation visit scheduled for 08/28/23 at 8:30. Patient arrived for 1st visit/orientation/education at 0830. Patient was referred to CR by Dr. Lonni End /Attending Dr. Mallipeddi due to NSTEMI/CABGx1. During orientation advised patient on arrival and appointment times what to wear, what to do before, during and after exercise. Reviewed attendance and class  policy.  Pt is scheduled to return Cardiac Rehab on 09/29/23 at 747. Pt was advised to come to class 15 minutes before class starts.  Discussed RPE/Dpysnea scales. Patient participated in warm up stretches. Patient was able to complete 6 minute walk test.  Telemetry:NSR. Patient was measured for the equipment. Discussed equipment safety with patient. Took patient pre-anthropometric measurements. Patient finished visit at 1015. First full day of exercise!  Patient was oriented to gym and equipment including functions, settings, policies, and procedures.  Patient's individual exercise prescription and treatment plan were reviewed.  All starting workloads were established based on the results of the 6 minute walk test done at initial orientation visit.  The plan for exercise progression was also introduced and progression will be customized based on patient's performance and goals. 30 day review completed. ITP sent to Dr. Dorn Ross, Medical Director of Cardiac Rehab. Continue with ITP unless changes are made by physician.  Still new to program. Nancyann had a follow up with cardiology today and stopped by after his visit.  He would like to discharge from rehab at this time as he is walking independently at home and progressing well.  We will discharge him at this time.      Comments: Discharge ITP

## 2023-10-26 NOTE — Progress Notes (Signed)
 Discharge Progress Report  Patient Details  Name: Luis Hardin MRN: [id number] Date of Birth: September 20, 1965 Referring Provider:   Flowsheet Row CARDIAC REHAB PHASE II ORIENTATION from 09/28/2023 in [facility] CARDIAC REHABILITATION  Referring Provider End, Lonni MD  [facility] Cardiologist: Dr. Diannah Proffer Mallipeddi]     Number of Visits: 15  Reason for Discharge:  Patient reached a stable level of exercise. Patient independent in their exercise.  Smoking History:  Social History   Tobacco Use  Smoking Status Former   Current packs/day: 0.00   Average packs/day: 0.3 packs/day for 5.0 years (1.3 ttl pk-yrs)   Types: Cigarettes   Start date: 02/01/1983   Quit date: 02/01/1988   Years since quitting: 35.7  Smokeless Tobacco Never    Diagnosis:  NSTEMI (non-ST elevated myocardial infarction) (HCC)  S/P CABG x 1   Initial Exercise Prescription:  Initial Exercise Prescription - 09/28/23 1000       Date of Initial Exercise RX and Referring Provider   Date 09/28/23    Referring Provider End, Christopher MD   Primary Cardiologist: Dr. Diannah Proffer Mallipeddi     Oxygen   Maintain Oxygen Saturation 88% or higher      Treadmill   MPH 2.5    Grade 1    Minutes 15    METs 3.26      Bike   Level 2    Minutes 15    METs 3.5      REL-XR   Level 3    Speed 50    Minutes 15    METs 3.5      Prescription Details   Frequency (times per week) 4    Duration Progress to 30 minutes of continuous aerobic without signs/symptoms of physical distress      Intensity   THRR 40-80% of Max Heartrate 113-146    Ratings of Perceived Exertion 11-13    Perceived Dyspnea 0-4      Progression   Progression Continue to progress workloads to maintain intensity without signs/symptoms of physical distress.      Resistance Training   Training Prescription Yes    Weight 4 lb    Reps 10-15          Discharge Exercise Prescription (Final Exercise Prescription Changes):   Exercise Prescription Changes - 10/16/23 1000       Response to Exercise   Blood Pressure (Admit) 108/80    Blood Pressure (Exit) 102/70    Heart Rate (Admit) 70 bpm    Heart Rate (Exercise) 123 bpm    Heart Rate (Exit) 100 bpm    Rating of Perceived Exertion (Exercise) 12    Duration Continue with 30 min of aerobic exercise without signs/symptoms of physical distress.    Intensity THRR unchanged      Resistance Training   Training Prescription Yes    Weight 4    Reps 10-15      Treadmill   MPH 3    Grade 2.5    Minutes 15    METs 4.33      Bike   Level 6   93 rpm   Minutes 15    METs 3.2          Functional Capacity:  6 Minute Walk     Row Name 09/28/23 1018         6 Minute Walk   Phase Initial     Distance 1350 feet     Walk Time 6 minutes     #  of Rest Breaks 0     MPH 2.56     METS 3.41     RPE 7     VO2 Peak 11.93     Symptoms No     Resting HR 80 bpm     Resting BP 100/70     Resting Oxygen Saturation  99 %     Exercise Oxygen Saturation  during 6 min walk 100 %     Max Ex. HR 110 bpm     Max Ex. BP 126/74     2 Minute Post BP 122/74        Psychological, QOL, Others - Outcomes: PHQ 2/9:    09/28/2023    9:32 AM  Depression screen PHQ 2/9  Decreased Interest 0  Down, Depressed, Hopeless 0  PHQ - 2 Score 0  Altered sleeping 0  Tired, decreased energy 0  Change in appetite 0  Feeling bad or failure about yourself  0  Trouble concentrating 0  Moving slowly or fidgety/restless 0  Suicidal thoughts 0  PHQ-9 Score 0    Quality of Life:  Quality of Life - 09/28/23 1115       Quality of Life   Select Quality of Life      Quality of Life Scores   Health/Function Pre 29.57 %    Socioeconomic Pre 30 %    Psych/Spiritual Pre 30 %    Family Pre 30 %    GLOBAL Pre 29.82 %           Nutrition & Weight - Outcomes:  Pre Biometrics - 09/28/23 1021       Pre Biometrics   Height 5' 8 (1.727 m)    Weight 214 lb 14.4 oz (97.5 kg)     Waist Circumference 43 inches    Hip Circumference 40.25 inches    Waist to Hip Ratio 1.07 %    BMI (Calculated) 32.68    Grip Strength 47.8 kg    Single Leg Stand 30 seconds

## 2023-10-26 NOTE — Patient Instructions (Addendum)
 Can use plain Mucinex for congestion. If no improvement in the next few days, would make your PCP aware.   Medication Instructions:  Your physician recommends that you continue on your current medications as directed. Please refer to the Current Medication list given to you today.  *If you need a refill on your cardiac medications before your next appointment, please call your pharmacy*  Lab Work: NONE   If you have labs (blood work) drawn today and your tests are completely normal, you will receive your results only by: MyChart Message (if you have MyChart) OR A paper copy in the mail If you have any lab test that is abnormal or we need to change your treatment, we will call you to review the results.  Testing/Procedures: NONE   Follow-Up: At [facility], you and your health needs are our priority.  As part of our continuing mission to provide you with exceptional heart care, our providers are all part of one team.  This team includes your primary Cardiologist (physician) and Advanced Practice Providers or APPs (Physician Assistants and Nurse Practitioners) who all work together to provide you with the care you need, when you need it.  Your next appointment:   5 -6 month(s)  Provider:   You may see Vishnu P Mallipeddi, MD or one of the following Advanced Practice Providers on your designated Care Team:   [place], PA-C  [place], [place] Olivia Pavy, [place]     We recommend signing up for the patient portal called MyChart.  Sign up information is provided on this After Visit Summary.  MyChart is used to connect with patients for Virtual Visits (Telemedicine).  Patients are able to view lab/test results, encounter notes, upcoming appointments, etc.  Non-urgent messages can be sent to your provider as well.   To learn more about what you can do with MyChart, go to [email or web address].   Other Instructions Thank you for choosing [facility]!

## 2023-10-27 ENCOUNTER — Encounter (HOSPITAL_COMMUNITY)

## 2023-10-30 ENCOUNTER — Encounter (HOSPITAL_COMMUNITY)

## 2023-10-31 ENCOUNTER — Encounter (HOSPITAL_COMMUNITY)

## 2023-11-01 ENCOUNTER — Encounter (HOSPITAL_COMMUNITY)

## 2023-11-03 ENCOUNTER — Encounter (HOSPITAL_COMMUNITY)

## 2023-11-06 ENCOUNTER — Encounter (HOSPITAL_COMMUNITY)

## 2023-11-07 ENCOUNTER — Encounter (HOSPITAL_COMMUNITY)

## 2023-11-08 ENCOUNTER — Encounter (HOSPITAL_COMMUNITY)

## 2023-11-10 ENCOUNTER — Encounter (HOSPITAL_COMMUNITY)

## 2023-11-13 ENCOUNTER — Encounter (HOSPITAL_COMMUNITY)

## 2023-11-14 ENCOUNTER — Encounter (HOSPITAL_COMMUNITY)

## 2023-11-15 ENCOUNTER — Encounter (HOSPITAL_COMMUNITY)

## 2023-11-17 ENCOUNTER — Encounter (HOSPITAL_COMMUNITY)

## 2023-11-20 ENCOUNTER — Encounter (HOSPITAL_COMMUNITY)

## 2023-11-22 ENCOUNTER — Encounter (HOSPITAL_COMMUNITY)

## 2023-11-24 ENCOUNTER — Encounter (HOSPITAL_COMMUNITY)

## 2023-11-27 ENCOUNTER — Encounter (HOSPITAL_COMMUNITY)

## 2023-11-29 ENCOUNTER — Encounter (HOSPITAL_COMMUNITY)

## 2023-12-01 ENCOUNTER — Encounter (HOSPITAL_COMMUNITY)

## 2023-12-04 ENCOUNTER — Encounter (HOSPITAL_COMMUNITY)

## 2023-12-06 ENCOUNTER — Encounter (HOSPITAL_COMMUNITY)

## 2023-12-08 ENCOUNTER — Encounter (HOSPITAL_COMMUNITY)

## 2023-12-11 ENCOUNTER — Encounter (HOSPITAL_COMMUNITY)

## 2023-12-13 ENCOUNTER — Encounter (HOSPITAL_COMMUNITY)

## 2023-12-15 ENCOUNTER — Encounter (HOSPITAL_COMMUNITY)

## 2023-12-18 ENCOUNTER — Encounter (HOSPITAL_COMMUNITY)

## 2023-12-24 ENCOUNTER — Emergency Department (HOSPITAL_COMMUNITY)
Admission: EM | Admit: 2023-12-24 | Discharge: 2023-12-24 | Disposition: A | Discharge status: Home / Self Care | Attending: Emergency Medicine | Admitting: Emergency Medicine

## 2023-12-24 ENCOUNTER — Ambulatory Visit

## 2023-12-24 ENCOUNTER — Other Ambulatory Visit: Payer: Self-pay

## 2023-12-24 ENCOUNTER — Emergency Department (HOSPITAL_COMMUNITY)

## 2023-12-24 ENCOUNTER — Encounter (HOSPITAL_COMMUNITY): Payer: Self-pay | Admitting: *Deleted

## 2023-12-24 ENCOUNTER — Ambulatory Visit
Admission: EM | Admit: 2023-12-24 | Discharge: 2023-12-24 | Disposition: A | Discharge status: Home / Self Care | Attending: Nurse Practitioner | Admitting: Nurse Practitioner

## 2023-12-24 DIAGNOSIS — Z79899 Other long term (current) drug therapy: Secondary | ICD-10-CM | POA: Diagnosis not present

## 2023-12-24 DIAGNOSIS — I10 Essential (primary) hypertension: Secondary | ICD-10-CM | POA: Diagnosis not present

## 2023-12-24 DIAGNOSIS — Z951 Presence of aortocoronary bypass graft: Secondary | ICD-10-CM | POA: Insufficient documentation

## 2023-12-24 DIAGNOSIS — Z7982 Long term (current) use of aspirin: Secondary | ICD-10-CM | POA: Diagnosis not present

## 2023-12-24 DIAGNOSIS — D72829 Elevated white blood cell count, unspecified: Secondary | ICD-10-CM | POA: Insufficient documentation

## 2023-12-24 DIAGNOSIS — J189 Pneumonia, unspecified organism: Secondary | ICD-10-CM | POA: Insufficient documentation

## 2023-12-24 DIAGNOSIS — R079 Chest pain, unspecified: Secondary | ICD-10-CM | POA: Diagnosis not present

## 2023-12-24 DIAGNOSIS — Z8546 Personal history of malignant neoplasm of prostate: Secondary | ICD-10-CM | POA: Diagnosis not present

## 2023-12-24 DIAGNOSIS — R7989 Other specified abnormal findings of blood chemistry: Secondary | ICD-10-CM | POA: Diagnosis not present

## 2023-12-24 DIAGNOSIS — R600 Localized edema: Secondary | ICD-10-CM | POA: Insufficient documentation

## 2023-12-24 DIAGNOSIS — R0602 Shortness of breath: Secondary | ICD-10-CM | POA: Diagnosis present

## 2023-12-24 HISTORY — DX: Acute myocardial infarction, unspecified: I21.9

## 2023-12-24 LAB — CBC WITH DIFFERENTIAL/PLATELET
Abs Immature Granulocytes: 0.06 K/uL (ref 0.00–0.07)
Basophils Absolute: 0 K/uL (ref 0.0–0.1)
Basophils Relative: 0 %
Eosinophils Absolute: 0.1 K/uL (ref 0.0–0.5)
Eosinophils Relative: 1 %
HCT: 36.4 % — ABNORMAL LOW (ref 39.0–52.0)
Hemoglobin: 12 g/dL — ABNORMAL LOW (ref 13.0–17.0)
Immature Granulocytes: 0 %
Lymphocytes Relative: 10 %
Lymphs Abs: 1.5 K/uL (ref 0.7–4.0)
MCH: 28.1 pg (ref 26.0–34.0)
MCHC: 33 g/dL (ref 30.0–36.0)
MCV: 85.2 fL (ref 80.0–100.0)
Monocytes Absolute: 1.3 K/uL — ABNORMAL HIGH (ref 0.1–1.0)
Monocytes Relative: 9 %
Neutro Abs: 11.2 K/uL — ABNORMAL HIGH (ref 1.7–7.7)
Neutrophils Relative %: 80 %
Platelets: 334 K/uL (ref 150–400)
RBC: 4.27 MIL/uL (ref 4.22–5.81)
RDW: 14.4 % (ref 11.5–15.5)
WBC: 14.1 K/uL — ABNORMAL HIGH (ref 4.0–10.5)
nRBC: 0 % (ref 0.0–0.2)

## 2023-12-24 LAB — BASIC METABOLIC PANEL WITH GFR
Anion gap: 12 (ref 5–15)
BUN: 8 mg/dL (ref 6–20)
CO2: 25 mmol/L (ref 22–32)
Calcium: 8.8 mg/dL — ABNORMAL LOW (ref 8.9–10.3)
Chloride: 99 mmol/L (ref 98–111)
Creatinine, Ser: 0.85 mg/dL (ref 0.61–1.24)
GFR, Estimated: >60 mL/min (ref >60)
Glucose, Bld: 115 mg/dL — ABNORMAL HIGH (ref 70–99)
Potassium: 4.1 mmol/L (ref 3.5–5.1)
Sodium: 136 mmol/L (ref 135–145)

## 2023-12-24 LAB — RESP PANEL BY RT-PCR (RSV, FLU A&B, COVID)  RVPGX2
Influenza A by PCR: NEGATIVE
Influenza B by PCR: NEGATIVE
Resp Syncytial Virus by PCR: NEGATIVE
SARS Coronavirus 2 by RT PCR: NEGATIVE

## 2023-12-24 LAB — PRO BRAIN NATRIURETIC PEPTIDE: Pro Brain Natriuretic Peptide: 241 pg/mL (ref <300.0)

## 2023-12-24 LAB — POC COVID19/FLU A&B COMBO
Covid Antigen, POC: NEGATIVE
Influenza A Antigen, POC: NEGATIVE
Influenza B Antigen, POC: NEGATIVE

## 2023-12-24 LAB — TROPONIN T, HIGH SENSITIVITY
Troponin T High Sensitivity: <15 ng/L (ref 0–19)
Troponin T High Sensitivity: <15 ng/L (ref 0–19)

## 2023-12-24 LAB — D-DIMER, QUANTITATIVE: D-Dimer, Quant: 1.57 ug{FEU}/mL — ABNORMAL HIGH (ref 0.00–0.50)

## 2023-12-24 MED ORDER — SODIUM CHLORIDE 0.9 % IV BOLUS
1000.0000 mL | Freq: Once | INTRAVENOUS | Status: AC
  Administered 2023-12-24: 1000 mL via INTRAVENOUS

## 2023-12-24 MED ORDER — IOHEXOL 350 MG/ML SOLN
75.0000 mL | Freq: Once | INTRAVENOUS | Status: AC | PRN
  Administered 2023-12-24: 75 mL via INTRAVENOUS

## 2023-12-24 MED ORDER — AZITHROMYCIN 250 MG PO TABS
500.0000 mg | ORAL_TABLET | Freq: Once | ORAL | Status: AC
  Administered 2023-12-24: 500 mg via ORAL
  Filled 2023-12-24: qty 2

## 2023-12-24 MED ORDER — SODIUM CHLORIDE 0.9 % IV SOLN
1.0000 g | Freq: Once | INTRAVENOUS | Status: AC
  Administered 2023-12-24: 1 g via INTRAVENOUS
  Filled 2023-12-24: qty 10

## 2023-12-24 MED ORDER — DOXYCYCLINE HYCLATE 100 MG PO CAPS: 100.0000 mg | ORAL_CAPSULE | Freq: Two times a day (BID) | ORAL | 0 refills | Status: AC

## 2023-12-24 MED ORDER — ACETAMINOPHEN 500 MG PO TABS
1000.0000 mg | ORAL_TABLET | Freq: Once | ORAL | Status: AC
  Administered 2023-12-24: 1000 mg via ORAL
  Filled 2023-12-24: qty 2

## 2023-12-24 NOTE — ED Notes (Signed)
 Report given to Triage RN at [facility]

## 2023-12-24 NOTE — Discharge Instructions (Addendum)
 You were evaluated in the emergency room for shortness of breath.  Your exam and history is most consistent with pneumonia.  Prescription for doxycycline  has been sent to your pharmacy.  Please be sure to complete the full course of antibiotics.  Please follow with your primary care doctor for further management.  If you experience any new or worsening symptoms please return to the emergency room.  As discussed please return for a ultrasound of your left lower extremity.

## 2023-12-24 NOTE — ED Provider Notes (Addendum)
 [facility]    CSN: [id number] Arrival date & time: 12/24/23  1229      History   Chief Complaint No chief complaint on file.   HPI Luis Hardin is a 58 y.o. male.   The history is provided by the patient.   Patient presents for complaints of left-sided chest discomfort, cough, shortness of breath, and wheezing.  Patient states symptoms have been present for the past 1.5 weeks.  States he has increased discomfort in his chest with deep breathing.  He denies recent fever, headache, ear pain, abdominal pain, nausea, vomiting, diarrhea, or rash.  Patient reports that he did have a heart attack in July and had open heart surgery.  He states that he thinks that the left-sided chest/shoulder pain may be coming from how he has to lie on his chest when he is at work.  Patient's spouse states that he has been wheezing, and states that she has noticed that he has become more short of breath over the past 24 hours.  Patient denies history of smoking, asthma, or COPD.  States he has been taking Tylenol  and oxycodone  for his symptoms.  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   NSTEMI (non-ST elevated myocardial infarction) (HCC) 08/12/2023   Elevated troponin 05/17/2023   Side effect of drug 05/17/2023   Right ventricular enlargement 05/17/2023   Chest pain of uncertain etiology 05/17/2023   Chest pain 05/15/2023   HTN (hypertension) 05/15/2023   Prostate cancer (HCC) 04/21/2022   Colon cancer screening    GERD (gastroesophageal reflux disease) 12/17/2015   Rectal bleeding 12/17/2015   Hemorrhoids 12/17/2015   Right inguinal hernia 01/28/2013     Past Surgical History:  Procedure Laterality Date   CARDIAC SURGERY     COLONOSCOPY N/A 01/04/2016   Procedure: COLONOSCOPY;  Surgeon: Margo LITTIE Haddock, MD;  Location: [facility];  Service: Endoscopy;  Laterality: N/A;  1:00 PM   COLONOSCOPY WITH PROPOFOL  N/A 08/16/2021   Procedure: COLONOSCOPY WITH PROPOFOL ;  Surgeon: Cindie Carlin POUR, DO;  Location: [facility];  Service: Endoscopy;  Laterality: N/A;  7:30am, asa 2 (needed a monday)   ESOPHAGOGASTRODUODENOSCOPY  11/2007   [place]: ?short segment Barrett's endoscopically but biopsies did not confirm.  Bx eosinophilic esophagitis, acute and chronic inflammation of distal esophagus and gastric cardia   ESOPHAGOGASTRODUODENOSCOPY  03/2003   [place]: ?short segment Barrett's, biopsy showed chronic carditis.    INGUINAL HERNIA REPAIR Right 02/12/2013   Procedure: RIGHT INGUINAL HERNIA REPAIR;  Surgeon: Vicenta DELENA Poli, MD;  Location: [facility];  Service: General;  Laterality: Right;   INSERTION OF MESH N/A 02/12/2013   Procedure: INSERTION OF MESH;  Surgeon: Vicenta DELENA Poli, MD;  Location: [facility];  Service: General;  Laterality: N/A;   LEFT HEART CATH AND CORONARY ANGIOGRAPHY N/A 08/14/2023   Procedure: LEFT HEART CATH AND CORONARY ANGIOGRAPHY;  Surgeon: Mady Bruckner, MD;  Location: [facility];  Service: Cardiovascular;  Laterality: N/A;   POLYPECTOMY  08/16/2021   Procedure: POLYPECTOMY INTESTINAL;  Surgeon: Cindie Carlin POUR, DO;  Location: [facility];  Service: Endoscopy;;   PROSTATECTOMY  04/2022   ROBOT ASSISTED LAPAROSCOPIC RADICAL PROSTATECTOMY N/A 04/21/2022   Procedure: XI ROBOTIC ASSISTED LAPAROSCOPIC RADICAL PROSTATECTOMY LEVEL 1;  Surgeon: Renda Glance, MD;  Location: [facility];  Service: Urology;  Laterality: N/A;  210 MINUTES NEEDED FRO CASE   SHOULDER SURGERY Left 2009   L SHOULDER   TUMOR REMOVED  1990   LEFT SIDE OF NECK (BENIGN)       Home Medications    Prior to Admission medications    Medication Sig Start Date End Date Taking? Authorizing Provider  acetaminophen  (TYLENOL ) 500 MG tablet Take 1,000 mg by mouth every 6 (six) hours as needed for moderate pain.    [provider]  aspirin  EC 81 MG tablet Take 1 tablet (81 mg total) by mouth daily. Swallow whole. 10/26/23   Strader, Laymon HERO, PA-C  atorvastatin  (LIPITOR) 20 MG tablet Take 1 tablet (20 mg total) by mouth daily. 10/26/23   Johnson, Laymon HERO, PA-C  metoprolol  tartrate (LOPRESSOR ) 25 MG tablet Take 1 tablet (25 mg total) by mouth 2 (two) times daily. 10/26/23   Strader, Brittany M, PA-C  Multiple Vitamin (MULTIVITAMIN) tablet Take 1 tablet by mouth daily. Patient not taking: Reported on 10/26/2023    [provider]  omeprazole (PRILOSEC) 20 MG capsule Take 20 mg by mouth daily.    [provider]  triamcinolone cream (KENALOG) 0.1 % Apply 1 Application topically 2 (two) times daily as needed (eczema). 02/28/22   [provider]    Family History Family History  Problem Relation Age of Onset   Heart disease Mother    Cancer Father        lung   Cancer Brother        skin   Colon polyps Brother    Cancer Paternal Grandfather        mouth   Colon polyps Maternal Uncle     Social History Social History   Tobacco Use   Smoking status: Former    Current packs/day: 0.00    Average packs/day: 0.3 packs/day for 5.0 years (1.3 ttl pk-yrs)    Types: Cigarettes    Start date: 02/01/1983    Quit date: 02/01/1988    Years since quitting: 35.9   Smokeless tobacco: Never  Vaping Use   Vaping status: Never Used  Substance Use Topics   Alcohol use: No   Drug use: No     Allergies   Patient has no known allergies.   Review of Systems Review of Systems Per HPI  Physical Exam Triage Vital Signs ED Triage Vitals  Encounter Vitals Group     BP 12/24/23 1326 (!) 144/90     Girls Systolic BP Percentile --      Girls Diastolic BP Percentile --      Boys Systolic BP  Percentile --      Boys Diastolic BP Percentile --      Pulse Rate 12/24/23 1326 (!) 111     Resp 12/24/23 1326 (!) 22     Temp 12/24/23 1326 99.5 F (37.5 C)     Temp Source 12/24/23 1326 Oral     SpO2 --      Weight --      Height --      Head Circumference --      Peak Flow --      Pain Score 12/24/23 1327 1     Pain Loc --  Pain Education --      Exclude from Growth Chart --    No data found.  Updated Vital Signs BP (!) 145/90 (BP Location: Right Arm)   Pulse (!) 106   Temp 98.3 F (36.8 C) (Oral)   Resp (!) 24   SpO2 96%   Visual Acuity Right Eye Distance:   Left Eye Distance:   Bilateral Distance:    Right Eye Near:   Left Eye Near:    Bilateral Near:     Physical Exam Vitals and nursing note reviewed.  Constitutional:      Appearance: Normal appearance.  HENT:     Head: Normocephalic.     Right Ear: Tympanic membrane, ear canal and external ear normal.     Left Ear: Tympanic membrane, ear canal and external ear normal.     Nose: Nose normal.     Mouth/Throat:     Mouth: Mucous membranes are moist.  Eyes:     Extraocular Movements: Extraocular movements intact.     Pupils: Pupils are equal, round, and reactive to light.  Cardiovascular:     Rate and Rhythm: Normal rate and regular rhythm.     Pulses: Normal pulses.     Heart sounds: Normal heart sounds.  Pulmonary:     Effort: Pulmonary effort is normal. No respiratory distress.     Breath sounds: No stridor. Rales (left lower lobe) present. No wheezing or rhonchi.  Chest:     Chest wall: No deformity or tenderness.  Abdominal:     General: Bowel sounds are normal. There is no distension.     Palpations: Abdomen is soft.     Tenderness: There is no abdominal tenderness.  Musculoskeletal:     Cervical back: Normal range of motion.  Skin:    General: Skin is warm and dry.  Neurological:     General: No focal deficit present.     Mental Status: He is alert and oriented to person, place, and  time.  Psychiatric:        Mood and Affect: Mood normal.        Behavior: Behavior normal.      UC Treatments / Results  Labs (all labs ordered are listed, but only abnormal results are displayed) Labs Reviewed  POC COVID19/FLU A&B COMBO - Normal    EKG: Sinus tachycardia, no STEMI.  Compared to EKGs dated 08/20/2023, 08/14/2023, and 08/12/2023.   Radiology DG Chest 2 View Result Date: 12/24/2023 CLINICAL DATA:  Chest pain and cough. EXAM: DG CHEST 2V COMPARISON:  Chest radiograph dated 08/20/2023. FINDINGS: Left lower lobe opacity most consistent with pneumonia. Clinical correlation and follow-up to resolution recommended. Trace left pleural effusion. No pneumothorax. The cardiac silhouette is within normal limits. Median sternotomy wires. No acute osseous pathology. IMPRESSION: Left lower lobe pneumonia. Electronically Signed   By: Vanetta Chou M.D.   On: 12/24/2023 13:59    Procedures Procedures (including critical care time)  Medications Ordered in UC Medications - No data to display  Initial Impression / Assessment and Plan / UC Course  I have reviewed the triage vital signs and the nursing notes.  Pertinent labs & imaging results that were available during my care of the patient were reviewed by me and considered in my medical decision making (see chart for details).  Chest x-ray was positive for left lower lobe pneumonia.  Trial ambulation was performed, patient's O2 sats remained at 96%; however, he demonstrated dyspnea on exertion and continued elevated heart  rate and blood pressure.  The patient's family is concerned given the patient's recent MI and heart surgery patient may have underlying cardiac involvement.  Lengthy discussion with the patient and his spouse, and they would like to go to the emergency department for further evaluation.  ER charge nurse was contacted by Devere, RN to advise of the patient's prior workup and to verbalize the patient's concerns and  desire to be seen in the emergency department despite a diagnosis provided here in the urgent care setting.  Patient's vital signs did not warrant transport to the emergency department via EMS, but were stable to allow patient to travel to the emergency department via private vehicle.  The patient was ambulatory at discharge.  Patient was discharged to the emergency department.   Final Clinical Impressions(s) / UC Diagnoses   Final diagnoses:  Chest pain, unspecified type   Discharge Instructions   None    ED Prescriptions   None    PDMP not reviewed this encounter.   Gilmer Etta PARAS, NP 12/24/23 1624    Gilmer Etta PARAS, NP 12/24/23 1625

## 2023-12-24 NOTE — ED Notes (Signed)
 Patient is being discharged from the Urgent Care and sent to the Emergency Department via private vehicle . Per NP, patient is in need of higher level of care due to tachycardia and tachypnea.  Patient does have pneumonia.  Patient is aware and verbalizes understanding of plan of care.  Vitals:   12/24/23 1405 12/24/23 1412  BP:  (!) 145/90  Pulse:  (!) 106  Resp:  (!) 24  Temp:  98.3 F (36.8 C)  SpO2: 96% 96%

## 2023-12-24 NOTE — ED Provider Notes (Signed)
 [place] EMERGENCY DEPARTMENT AT [facility] Provider Note   CSN: [id number] Arrival date & time: 12/24/23  1445     Patient presents with: Shortness of Breath   Luis Hardin is a 58 y.o. male with history of prostate cancer status post prostatectomy, hypertension, hyperlipidemia, MI status post CABG presents with complaints of chest pain and shortness of breath over the past couple weeks.  Chest pain is pleuritic.  Does endorse a mild intermittent nonproductive cough.  No other URI symptoms.  No fevers.  Was evaluated at urgent care today.  Had an x-ray that was felt to be consistent with pneumonia.  Was referred here.  Denies any history of blood clots.  No recent surgeries, travel or hospitalizations.  Did hurt his left lower extremity couple weeks ago and has been having some lower extremity swelling.  Is on aspirin , otherwise no other AC.    Shortness of Breath  Past Medical History:  Diagnosis Date   Cancer (HCC) 2023   prostate cancer   GERD (gastroesophageal reflux disease)    H/O hiatal hernia    Hyperlipidemia    Hypertension    Inguinal hernia    RIGHT   MI (myocardial infarction) (HCC)    Tendonitis    KNEES   Past Surgical History:  Procedure Laterality Date   CARDIAC SURGERY     COLONOSCOPY N/A 01/04/2016   Procedure: COLONOSCOPY;  Surgeon: Margo LITTIE Haddock, MD;  Location: [facility];  Service: Endoscopy;  Laterality: N/A;  1:00 PM   COLONOSCOPY WITH PROPOFOL  N/A 08/16/2021   Procedure: COLONOSCOPY WITH PROPOFOL ;  Surgeon: Cindie Carlin POUR, DO;  Location: [facility];  Service: Endoscopy;  Laterality: N/A;  7:30am, asa 2 (needed a monday)   ESOPHAGOGASTRODUODENOSCOPY  11/2007   [place]: ?short segment Barrett's endoscopically but biopsies did not confirm.  Bx eosinophilic esophagitis, acute and chronic inflammation of distal esophagus and gastric cardia   ESOPHAGOGASTRODUODENOSCOPY  03/2003   [place]: ?short segment Barrett's, biopsy  showed chronic carditis.    INGUINAL HERNIA REPAIR Right 02/12/2013   Procedure: RIGHT INGUINAL HERNIA REPAIR;  Surgeon: Vicenta DELENA Poli, MD;  Location: [facility];  Service: General;  Laterality: Right;   INSERTION OF MESH N/A 02/12/2013   Procedure: INSERTION OF MESH;  Surgeon: Vicenta DELENA Poli, MD;  Location: [facility];  Service: General;  Laterality: N/A;   LEFT HEART CATH AND CORONARY ANGIOGRAPHY N/A 08/14/2023   Procedure: LEFT HEART CATH AND CORONARY ANGIOGRAPHY;  Surgeon: Mady Bruckner, MD;  Location: [facility];  Service: Cardiovascular;  Laterality: N/A;   POLYPECTOMY  08/16/2021   Procedure: POLYPECTOMY INTESTINAL;  Surgeon: Cindie Carlin POUR, DO;  Location: [facility];  Service: Endoscopy;;   PROSTATECTOMY  04/2022   ROBOT ASSISTED LAPAROSCOPIC RADICAL PROSTATECTOMY N/A 04/21/2022   Procedure: XI ROBOTIC ASSISTED LAPAROSCOPIC RADICAL PROSTATECTOMY LEVEL 1;  Surgeon: Renda Glance, MD;  Location: [facility];  Service: Urology;  Laterality: N/A;  210 MINUTES NEEDED FRO CASE   SHOULDER SURGERY Left 2009   L SHOULDER   TUMOR REMOVED  1990   LEFT SIDE OF NECK (BENIGN)       Prior to Admission medications   Medication Sig Start Date End Date Taking? Authorizing Provider  doxycycline  (VIBRAMYCIN ) 100 MG capsule Take 1 capsule (100 mg total) by mouth 2 (two) times daily. 12/24/23  Yes Donnajean Lynwood DEL, PA-C  acetaminophen  (TYLENOL ) 500 MG tablet Take 1,000 mg by mouth every 6 (six) hours as needed for  moderate pain.    [provider]  aspirin  EC 81 MG tablet Take 1 tablet (81 mg total) by mouth daily. Swallow whole. 10/26/23   Strader, Laymon HERO, PA-C  atorvastatin  (LIPITOR) 20 MG tablet Take 1 tablet (20 mg total) by mouth daily. 10/26/23   Strader, Laymon HERO, PA-C  metoprolol  tartrate (LOPRESSOR ) 25 MG tablet Take 1 tablet (25 mg total) by mouth 2 (two) times daily. 10/26/23   Strader, Brittany M, PA-C  Multiple Vitamin (MULTIVITAMIN) tablet Take 1 tablet by mouth  daily. Patient not taking: Reported on 10/26/2023    [provider]  omeprazole (PRILOSEC) 20 MG capsule Take 20 mg by mouth daily.    [provider]  triamcinolone cream (KENALOG) 0.1 % Apply 1 Application topically 2 (two) times daily as needed (eczema). 02/28/22   [provider]    Allergies: Patient has no known allergies.    Review of Systems  Respiratory:  Positive for shortness of breath.     Updated Vital Signs BP (!) 142/83   Pulse 98   Temp 100.3 F (37.9 C) (Oral)   Resp 17   Ht 5' 9 (1.753 m)   Wt 99.8 kg   SpO2 97%   BMI 32.49 kg/m   Physical Exam Vitals and nursing note reviewed.  Constitutional:      General: He is not in acute distress.    Appearance: He is well-developed.  HENT:     Head: Normocephalic and atraumatic.  Eyes:     Conjunctiva/sclera: Conjunctivae normal.  Cardiovascular:     Rate and Rhythm: Normal rate and regular rhythm.     Heart sounds: No murmur heard. Pulmonary:     Effort: Pulmonary effort is normal. No respiratory distress.     Breath sounds: Normal breath sounds.  Abdominal:     Palpations: Abdomen is soft.     Tenderness: There is no abdominal tenderness.  Musculoskeletal:        General: Swelling present.     Cervical back: Neck supple.     Comments: Left lower extremity with 1+ pitting edema, mild increased warmth without erythema, positive Homans, DP/PT pulses 2+, tolerates full range of motion of ankle and knee  Skin:    General: Skin is warm and dry.     Capillary Refill: Capillary refill takes less than 2 seconds.  Neurological:     Mental Status: He is alert.  Psychiatric:        Mood and Affect: Mood normal.     (all labs ordered are listed, but only abnormal results are displayed) Labs Reviewed  BASIC METABOLIC PANEL WITH GFR - Abnormal; Notable for the following components:      Result Value   Glucose, Bld 115 (*)    Calcium  8.8 (*)    All other components within normal  limits  CBC WITH DIFFERENTIAL/PLATELET - Abnormal; Notable for the following components:   WBC 14.1 (*)    Hemoglobin 12.0 (*)    HCT 36.4 (*)    Neutro Abs 11.2 (*)    Monocytes Absolute 1.3 (*)    All other components within normal limits  D-DIMER, QUANTITATIVE - Abnormal; Notable for the following components:   D-Dimer, Quant 1.57 (*)    All other components within normal limits  RESP PANEL BY RT-PCR (RSV, FLU A&B, COVID)  RVPGX2  PRO BRAIN NATRIURETIC PEPTIDE  TROPONIN T, HIGH SENSITIVITY  TROPONIN T, HIGH SENSITIVITY    EKG: None  Radiology: CT Angio Chest  PE W and/or Wo Contrast Result Date: 12/24/2023 EXAM: CTA CHEST AORTA 12/24/2023 05:03:44 PM TECHNIQUE: CTA of the chest was performed after the administration of 75 mL of iohexol  (OMNIPAQUE ) 350 MG/ML injection. Multiplanar reformatted images are provided for review. MIP images are provided for review. Automated exposure control, iterative reconstruction, and/or weight based adjustment of the mA/kV was utilized to reduce the radiation dose to as low as reasonably achievable. COMPARISON: 05/18/2023 CLINICAL HISTORY: Pulmonary embolism (PE) suspected, low to intermediate prob, neg D-dimer. FINDINGS: AORTA: No thoracic aortic dissection. No aneurysm. MEDIASTINUM: Small pericardial effusion. Prior CABG. No mediastinal lymphadenopathy. LYMPH NODES: No mediastinal, hilar or axillary lymphadenopathy. LUNGS AND PLEURA: Small left pleural effusion. Compressive atelectasis in the left lower lobe. No focal consolidation or pulmonary edema. No pneumothorax. UPPER ABDOMEN: Limited images of the upper abdomen are unremarkable. SOFT TISSUES AND BONES: No acute bone or soft tissue abnormality. IMPRESSION: 1. No evidence of pulmonary embolism. 2. Small left pleural effusion. 3. Compressive atelectasis in the left lower lobe. Electronically signed by: Franky Crease MD 12/24/2023 05:10 PM EST RP Workstation: [id number]   DG Chest 2 View Result Date:  12/24/2023 CLINICAL DATA:  Chest pain and cough. EXAM: DG CHEST 2V COMPARISON:  Chest radiograph dated 08/20/2023. FINDINGS: Left lower lobe opacity most consistent with pneumonia. Clinical correlation and follow-up to resolution recommended. Trace left pleural effusion. No pneumothorax. The cardiac silhouette is within normal limits. Median sternotomy wires. No acute osseous pathology. IMPRESSION: Left lower lobe pneumonia. Electronically Signed   By: Vanetta Chou M.D.   On: 12/24/2023 13:59     Ultrasound ED Thoracic  Date/Time: 12/24/2023 4:39 PM  Performed by: Donnajean Lynwood DEL, PA-C Authorized by: Donnajean Lynwood DEL, PA-C   Procedure details:    Indications: dyspnea     Assessment for:  Pleural effusion, interstitial syndrome and pneumonia   Left lung pleural:  Visualized   Right lung pleural:  Visualized   Images: archived   Comments:     Small left lower pleural effusion with associated unilateral B-lines concerning for possible pneumonia    Medications Ordered in the ED  sodium chloride  0.9 % bolus 1,000 mL (0 mLs Intravenous Stopped 12/24/23 1700)  cefTRIAXone  (ROCEPHIN ) 1 g in sodium chloride  0.9 % 100 mL IVPB (0 g Intravenous Stopped 12/24/23 1657)  azithromycin  (ZITHROMAX ) tablet 500 mg (500 mg Oral Given 12/24/23 1627)  iohexol  (OMNIPAQUE ) 350 MG/ML injection 75 mL (75 mLs Intravenous Contrast Given 12/24/23 1653)  acetaminophen  (TYLENOL ) tablet 1,000 mg (1,000 mg Oral Given 12/24/23 1811)    Clinical Course as of 12/24/23 1832  Sun Dec 24, 2023  1554 Patient with history of prostate cancer in remission evaluated for pleuritic chest pain and shortness of breath over the past couple weeks with associated mild nonproductive cough.  Additionally injured his left lower extremity at work and has been having some swelling and pain in his left calf.  Upon arrival he is tachycardic and tachypneic.  He does have a temp of 100.3.  An outpatient chest x-ray that was concerning  for possible left lower lobe pneumonia.  Will obtain cardiac and infectious workup and CT imaging to evaluate for possible PE. [JT]  1617 CBC with Differential(!) Leukocytosis of 14 [JT]  1618 Troponin T, High Sensitivity No elevation [JT]  1618 Pro Brain natriuretic peptide Within normal limits [JT]  1633 Basic metabolic panel(!) No significant finding [JT]  1633 Resp panel by RT-PCR (RSV, Flu A&B, Covid) Anterior Nasal Swab Negative [JT]  1633 D-dimer,  quantitative(!) Mildly elevated at 1.57  [JT]  1719 CT Angio Chest PE W and/or Wo Contrast No evidence of PE, small left pleural effusion, no lobar consolidation [JT]  1819 Successful ambulation test.  Will be discharged home. [JT]  1819 Will send in course of doxycycline .  Encouraged PCP follow-up.  Strict return precautions provided.  Will provide outpatient follow-up for venous Doppler for left lower extremity given swelling and positive dimer.  Patient and his wife are understanding agreement plan. [JT]    Clinical Course User Index [JT] Donnajean Lynwood DEL, PA-C                                 Medical Decision Making Amount and/or Complexity of Data Reviewed Labs: ordered. Decision-making details documented in ED Course. Radiology: ordered. Decision-making details documented in ED Course.  Risk OTC drugs. Prescription drug management.   This patient presents to the ED with chief complaint(s) of chest pain.  The complaint involves an extensive differential diagnosis and also carries with it a high risk of complications and morbidity.   Pertinent past medical history as listed in HPI  The differential diagnosis includes  No evidence of PE on Additional history obtained: Additional history obtained from spouse Records reviewed Care Everywhere/External Records  Disposition:   Patient will be discharged home. The patient has been appropriately medically screened and/or stabilized in the ED. I have low suspicion for any other  emergent medical condition which would require further screening, evaluation or treatment in the ED or require inpatient management. At time of discharge the patient is hemodynamically stable and in no acute distress. I have discussed work-up results and diagnosis with patient and answered all questions. Patient is agreeable with discharge plan. We discussed strict return precautions for returning to the emergency department and they verbalized understanding.     Social Determinants of Health:   none  This note was dictated with voice recognition software.  Despite best efforts at proofreading, errors may have occurred which can change the documentation meaning.       Final diagnoses:  Community acquired pneumonia of left lower lobe of lung    ED Discharge Orders          Ordered    Lower Ext Left Venous US        Comments: IMPORTANT PATIENT INSTRUCTIONS:  Your ED provider has recommended an Outpatient Ultrasound.  Please call [phone] to schedule an appointment.  If your appointment is scheduled for a Saturday, Sunday or holiday, please go to the [facility] Registration Desk at least 15 minutes prior to your appointment time and tell them you are there for an ultrasound.    If your appointment is scheduled for a weekday (Monday-Friday), please go directly to the [facility] at least 15 minutes prior to your appointment time and tell them you are there for an ultrasound.  Please call [phone] with questions.   12/24/23 1822    doxycycline (VIBRAMYCIN) 100 MG capsule  2 times daily        11 /23/25 1822               Donnajean Lynwood DEL DEVONNA 12/24/23 1832    Simon Lavonia SAILOR, MD 12/24/23 2056

## 2023-12-24 NOTE — ED Notes (Signed)
 Pt ambulated in hallway and maintained O2 of 94-95% with no complaints.

## 2023-12-24 NOTE — ED Triage Notes (Signed)
 Pt reports he is congested, SHOB, chest discomfort, wheezing , shoulder pain cough,x 4 days     Took tylenol  and oxycodone 

## 2023-12-24 NOTE — ED Triage Notes (Signed)
 Pt sent from UC with dx of PNA, concerned for elevated HR and SOB.

## 2023-12-26 ENCOUNTER — Other Ambulatory Visit (HOSPITAL_COMMUNITY): Payer: Self-pay | Admitting: Internal Medicine

## 2023-12-26 ENCOUNTER — Ambulatory Visit (HOSPITAL_COMMUNITY)
Admission: RE | Admit: 2023-12-26 | Discharge: 2023-12-26 | Disposition: A | Discharge status: Home / Self Care | Payer: Worker's Compensation | Source: Ambulatory Visit | Attending: Emergency Medicine | Admitting: Emergency Medicine

## 2023-12-26 ENCOUNTER — Encounter (HOSPITAL_COMMUNITY): Payer: Self-pay | Admitting: Internal Medicine

## 2023-12-26 DIAGNOSIS — M79604 Pain in right leg: Secondary | ICD-10-CM

## 2023-12-26 DIAGNOSIS — M79605 Pain in left leg: Secondary | ICD-10-CM | POA: Diagnosis present

## 2024-04-24 ENCOUNTER — Ambulatory Visit: Admitting: Internal Medicine

## 2024-07-05 ENCOUNTER — Other Ambulatory Visit (HOSPITAL_COMMUNITY)
